# Patient Record
Sex: Female | Born: 1955
Health system: Southern US, Community
[De-identification: ages and names within clinical notes are randomized; demographics above are authoritative.]

## PROBLEM LIST (undated history)

## (undated) DIAGNOSIS — R053 Chronic cough: Secondary | ICD-10-CM

## (undated) DIAGNOSIS — T4145XA Adverse effect of unspecified anesthetic, initial encounter: Secondary | ICD-10-CM

## (undated) DIAGNOSIS — T8859XA Other complications of anesthesia, initial encounter: Secondary | ICD-10-CM

## (undated) DIAGNOSIS — I10 Essential (primary) hypertension: Secondary | ICD-10-CM

## (undated) DIAGNOSIS — K219 Gastro-esophageal reflux disease without esophagitis: Secondary | ICD-10-CM

## (undated) DIAGNOSIS — I480 Paroxysmal atrial fibrillation: Secondary | ICD-10-CM

## (undated) DIAGNOSIS — K469 Unspecified abdominal hernia without obstruction or gangrene: Secondary | ICD-10-CM

## (undated) DIAGNOSIS — I447 Left bundle-branch block, unspecified: Secondary | ICD-10-CM

## (undated) DIAGNOSIS — R32 Unspecified urinary incontinence: Secondary | ICD-10-CM

## (undated) DIAGNOSIS — N83202 Unspecified ovarian cyst, left side: Secondary | ICD-10-CM

## (undated) DIAGNOSIS — R05 Cough: Secondary | ICD-10-CM

## (undated) DIAGNOSIS — N3289 Other specified disorders of bladder: Secondary | ICD-10-CM

## (undated) DIAGNOSIS — M5126 Other intervertebral disc displacement, lumbar region: Secondary | ICD-10-CM

## (undated) DIAGNOSIS — L509 Urticaria, unspecified: Secondary | ICD-10-CM

## (undated) DIAGNOSIS — N3281 Overactive bladder: Secondary | ICD-10-CM

## (undated) DIAGNOSIS — Z9989 Dependence on other enabling machines and devices: Secondary | ICD-10-CM

## (undated) DIAGNOSIS — I4891 Unspecified atrial fibrillation: Secondary | ICD-10-CM

## (undated) DIAGNOSIS — D509 Iron deficiency anemia, unspecified: Secondary | ICD-10-CM

## (undated) DIAGNOSIS — E785 Hyperlipidemia, unspecified: Secondary | ICD-10-CM

## (undated) DIAGNOSIS — N393 Stress incontinence (female) (male): Secondary | ICD-10-CM

## (undated) DIAGNOSIS — F988 Other specified behavioral and emotional disorders with onset usually occurring in childhood and adolescence: Secondary | ICD-10-CM

## (undated) DIAGNOSIS — Z87820 Personal history of traumatic brain injury: Secondary | ICD-10-CM

## (undated) DIAGNOSIS — E119 Type 2 diabetes mellitus without complications: Secondary | ICD-10-CM

## (undated) DIAGNOSIS — E1142 Type 2 diabetes mellitus with diabetic polyneuropathy: Secondary | ICD-10-CM

## (undated) DIAGNOSIS — F418 Other specified anxiety disorders: Secondary | ICD-10-CM

## (undated) DIAGNOSIS — N83201 Unspecified ovarian cyst, right side: Secondary | ICD-10-CM

## (undated) DIAGNOSIS — Z87442 Personal history of urinary calculi: Secondary | ICD-10-CM

## (undated) DIAGNOSIS — N201 Calculus of ureter: Secondary | ICD-10-CM

## (undated) DIAGNOSIS — Z8669 Personal history of other diseases of the nervous system and sense organs: Secondary | ICD-10-CM

## (undated) DIAGNOSIS — G4733 Obstructive sleep apnea (adult) (pediatric): Secondary | ICD-10-CM

## (undated) HISTORY — PX: EXTRACORPOREAL SHOCK WAVE LITHOTRIPSY: SHX1557

## (undated) HISTORY — DX: Type 2 diabetes mellitus without complications: E11.9

## (undated) HISTORY — DX: Other specified anxiety disorders: F41.8

## (undated) HISTORY — DX: Iron deficiency anemia, unspecified: D50.9

## (undated) HISTORY — PX: OTHER SURGICAL HISTORY: SHX169

## (undated) HISTORY — DX: Other specified behavioral and emotional disorders with onset usually occurring in childhood and adolescence: F98.8

## (undated) HISTORY — PX: CARDIOVASCULAR STRESS TEST: SHX262

## (undated) HISTORY — DX: Essential (primary) hypertension: I10

## (undated) HISTORY — DX: Urticaria, unspecified: L50.9

## (undated) HISTORY — DX: Gastro-esophageal reflux disease without esophagitis: K21.9

## (undated) HISTORY — PX: KNEE ARTHROSCOPY W/ DEBRIDEMENT: SHX1867

## (undated) HISTORY — DX: Morbid (severe) obesity due to excess calories: E66.01

---
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
## 1984-03-29 HISTORY — PX: RECTOVAGINAL FISTULA CLOSURE: SUR265

## 1988-03-29 HISTORY — PX: RECTOVAGINAL FISTULA CLOSURE: SUR265

## 1998-02-26 ENCOUNTER — Encounter: Payer: Self-pay | Admitting: *Deleted

## 1998-02-26 ENCOUNTER — Ambulatory Visit (HOSPITAL_COMMUNITY): Admission: RE | Admit: 1998-02-26 | Discharge: 1998-02-26 | Payer: Self-pay | Admitting: *Deleted

## 1998-05-27 ENCOUNTER — Encounter: Admission: RE | Admit: 1998-05-27 | Discharge: 1998-06-25 | Payer: Self-pay | Admitting: Family Medicine

## 1998-06-16 ENCOUNTER — Ambulatory Visit (HOSPITAL_COMMUNITY): Admission: RE | Admit: 1998-06-16 | Discharge: 1998-06-16 | Payer: Self-pay | Admitting: Family Medicine

## 1998-06-16 ENCOUNTER — Encounter: Payer: Self-pay | Admitting: Family Medicine

## 1998-06-25 ENCOUNTER — Encounter: Payer: Self-pay | Admitting: *Deleted

## 1998-06-25 ENCOUNTER — Ambulatory Visit (HOSPITAL_COMMUNITY): Admission: RE | Admit: 1998-06-25 | Discharge: 1998-06-25 | Payer: Self-pay | Admitting: *Deleted

## 1998-12-30 ENCOUNTER — Encounter: Admission: RE | Admit: 1998-12-30 | Discharge: 1999-01-30 | Payer: Self-pay | Admitting: Specialist

## 1999-07-14 ENCOUNTER — Encounter: Payer: Self-pay | Admitting: Allergy and Immunology

## 1999-07-14 ENCOUNTER — Encounter: Admission: RE | Admit: 1999-07-14 | Discharge: 1999-07-14 | Payer: Self-pay | Admitting: Allergy and Immunology

## 1999-10-15 ENCOUNTER — Ambulatory Visit (HOSPITAL_BASED_OUTPATIENT_CLINIC_OR_DEPARTMENT_OTHER): Admission: RE | Admit: 1999-10-15 | Discharge: 1999-10-15 | Payer: Self-pay | Admitting: Otolaryngology

## 1999-12-15 ENCOUNTER — Ambulatory Visit: Admission: RE | Admit: 1999-12-15 | Discharge: 1999-12-15 | Payer: Self-pay | Admitting: Pulmonary Disease

## 1999-12-15 ENCOUNTER — Encounter: Payer: Self-pay | Admitting: Pulmonary Disease

## 2000-01-07 ENCOUNTER — Ambulatory Visit (HOSPITAL_COMMUNITY): Admission: RE | Admit: 2000-01-07 | Discharge: 2000-01-07 | Payer: Self-pay | Admitting: Pulmonary Disease

## 2000-12-19 ENCOUNTER — Encounter: Payer: Self-pay | Admitting: Orthopedic Surgery

## 2000-12-22 ENCOUNTER — Encounter: Payer: Self-pay | Admitting: Orthopedic Surgery

## 2000-12-22 ENCOUNTER — Inpatient Hospital Stay (HOSPITAL_COMMUNITY): Admission: RE | Admit: 2000-12-22 | Discharge: 2000-12-25 | Payer: Self-pay | Admitting: Orthopedic Surgery

## 2001-06-09 ENCOUNTER — Other Ambulatory Visit: Admission: RE | Admit: 2001-06-09 | Discharge: 2001-06-09 | Payer: Self-pay | Admitting: Obstetrics and Gynecology

## 2004-11-29 ENCOUNTER — Emergency Department (HOSPITAL_COMMUNITY): Admission: EM | Admit: 2004-11-29 | Discharge: 2004-11-29 | Payer: Self-pay | Admitting: *Deleted

## 2005-01-11 ENCOUNTER — Inpatient Hospital Stay (HOSPITAL_COMMUNITY): Admission: EM | Admit: 2005-01-11 | Discharge: 2005-01-15 | Payer: Self-pay | Admitting: Emergency Medicine

## 2005-01-14 ENCOUNTER — Encounter (INDEPENDENT_AMBULATORY_CARE_PROVIDER_SITE_OTHER): Payer: Self-pay | Admitting: *Deleted

## 2005-01-14 HISTORY — PX: LAPAROSCOPIC CHOLECYSTECTOMY: SUR755

## 2005-02-23 ENCOUNTER — Emergency Department (HOSPITAL_COMMUNITY): Admission: EM | Admit: 2005-02-23 | Discharge: 2005-02-23 | Payer: Self-pay | Admitting: Emergency Medicine

## 2005-03-15 ENCOUNTER — Ambulatory Visit (HOSPITAL_COMMUNITY): Admission: RE | Admit: 2005-03-15 | Discharge: 2005-03-15 | Payer: Self-pay | Admitting: Gastroenterology

## 2007-03-21 ENCOUNTER — Encounter: Admission: RE | Admit: 2007-03-21 | Discharge: 2007-03-22 | Payer: Self-pay | Admitting: Otolaryngology

## 2007-04-04 ENCOUNTER — Encounter: Admission: RE | Admit: 2007-04-04 | Discharge: 2007-04-26 | Payer: Self-pay | Admitting: Otolaryngology

## 2010-08-14 NOTE — Consult Note (Signed)
[name], [name]               ACCOUNT NO.:  [id number]   MEDICAL RECORD NO.:  [id number]          PATIENT TYPE:  INP   LOCATION:  0103                         FACILITY:  [facility]   PHYSICIAN:  Kristin L. Lovena Le, MD  DATE OF BIRTH:  1955-07-28   DATE OF CONSULTATION:  01/11/2005  DATE OF DISCHARGE:                                   CONSULTATION   REFERRING PHYSICIAN:  Haywood [name], M.D.   REASON FOR CONSULTATION:  To assist with hypertension, diabetes and sleep  apnea.   HISTORY OF PRESENT ILLNESS:  The patient is a 55 year old, white female with  a history of hypertension, diabetes, sleep apnea and known gallbladder  disease who is being admitted for exacerbation of said gallbladder disease  resulting in biliary pancreatitis.  We have been asked to assist with her  hypertension and diabetes as well as her sleep apnea.   REVIEW OF SYSTEMS:  GASTROINTESTINAL:  Right-sided and central abdominal  pain, nausea, vomiting.  CONSTITUTIONAL:  No fevers or chills.  No recent  weight loss or gain.  MUSCULOSKELETAL:  Chronic pain in the right lower  extremity, but otherwise review of systems is negative.   PAST MEDICAL HISTORY:  1.  Sleep apnea on CPAP setting presumed to be 12 as per the patient.  2.  DVT in her left lower extremity.  3.  Morbidly obese.  4.  Right ankle fracture.  5.  Osteoarthritis in the right ankle.  6.  Diabetes diagnosed 1 month ago.  7.  Chronic cough resulting in urinary incontinence.   PAST SURGICAL HISTORY:  1.  Rectovaginal fistula requiring colostomy.  Her colostomy was taken down      in 1991.  2.  Multiple abdominal surgeries.  3.  Ankle repair with bone grafting.  4.  Tonsils were removed.   ALLERGIES:  SULFA, [place], [place].   CURRENT MEDICATIONS:  1.  Lexapro 20 mg daily.  2.  Ritalin 10 mg b.i.d. p.r.n.  3.  Hyzaar 50/12.5 once daily.  4.  Claritin p.r.n.  5.  Vicodin p.r.n.  6.  Phenergan 25 mg p.r.n. for nausea.   SOCIAL  HISTORY:  She denies tobacco.  She does have occasional ethanol.  She  is retired from [name].   FAMILY HISTORY:  Mom is living with diabetes and hypertension.  Dad is  deceased secondary to a brain tumor.   PHYSICAL EXAMINATION:  VITAL SIGNS:  Temperature 98.2, blood pressure 97/60,  pulse 73, respirations 18, saturations 93%.  [name]:  This is an obese, white female in no acute distress.  She is  mildly somnolent secondary to pain medications.  HEENT:  She is normocephalic, atraumatic, pupils equal round and reactive to  light.  Extraocular movements intact.  Mucous membranes are moist.  NECK:  Supple and short.  There is no JVD, no lymph nodes, no carotid  bruits.  CHEST:  Clear to auscultation with decreased breath sounds bilaterally at  the bases.  CARDIAC:  Regular rate and rhythm with positive S1, S2, no S3, S4, no  murmurs, rubs or gallops.  ABDOMEN:  Soft, obese, nondistended with positive bowel sounds.  She is  tender to deep palpation in the epigastrium greater than the right upper  quadrant.  EXTREMITIES:  No clubbing, cyanosis or edema.  NEUROLOGIC:  She is sleepy, secondary to pain medications.  Power appears to  be 5/5.  Sensation intact.  Plantars are downgoing.   LABORATORY DATA AND X-RAY FINDINGS:  White count 11.5, hemoglobin 14.7,  hematocrit 43.3, platelets 342.  Sodium 138, potassium 3.2, chloride 99, CO2  24, BUN 16, creatinine 0.9, glucose 183.  Amylase 1695, lipase 1624, AST 268  with an ALT of 165 and Alk phos of 152.   ASSESSMENT:  This is a 55 year old, obese female with biliary pancreatitis  and known diabetes which was recently diagnosed, hypertension and sleep  apnea.  We will assist with her diabetes, sleep apnea and hypertension care.   RECOMMENDATIONS:  1.  Pulmonary.  Sleep apnea on CPAP which we will continue as per pulmonary      protocol.  Chest x-ray shows no acute disease.  Therefore, we will      recommend aggressive incentive  spirometry and continuation of her CPAP.  2.  Cardiovascular.  History of hypertension.  Blood pressure is currently      stable on Hyzaar.  Will continue the Losartan and hydrochlorothiazide      components with hold parameters for systolic blood pressures less than      100.  Will obtain a preoperative EKG.  3.  Gastrointestinal.  Surgical intervention will be as per surgery.  She is      NPO except for sips right now.  4.  Endocrine.  Will place her on sliding scale insulin since she will be      NPO and check a hemoglobin A1c.  5.  Deep venous thrombosis prophylaxis with Lovenox.  The patient does have      a history of lower extremity DVT.  She will require coverage while      convalescing.  6.  Hypokalemia.  Will replete her potassium p.o. and IV.      Kristin L. Lovena Le, MD  Electronically Signed     MLT/MEDQ  D:  01/11/2005  T:  01/11/2005  Job:  [id number]   cc:   Kristin [name]. Kristin [name], M.D.  Fax: [phone]

## 2010-08-14 NOTE — H&P (Signed)
NAMEMarland Kitchen  Kristin [name], Kristin [name] NO.:  [id number]   MEDICAL RECORD NO.:  [id number]          PATIENT TYPE:  IN   LOCATION:                               FACILITY:  [facility]   PHYSICIAN:  Haywood Lasso, M.D.DATE OF BIRTH:  [birth year]   DATE OF ADMISSION:  01/11/2005  DATE OF DISCHARGE:                                HISTORY & PHYSICAL   OFFICE MEDICAL RECORD NUMBER:  [id number].   CHIEF COMPLAINT:  Abdominal pain.   HISTORY OF PRESENT ILLNESS:  Kristin [name] is a 55 year old lady who is known  to have gallstones and has had one episode apparently of biliary  pancreatitis with known gallstones. She was admitted via the emergency room  today with another pain, this time more nausea and some vomiting which she  has not had previously. This pain has not gone away at all. She has been  having these now about 2 months. This is a very typical one and as noted on  my office notes very similar to those.   MEDICATIONS:  Hyzaar, Lexapro, Prilosec, Claritin, lorazepam, and Ritalin.   ALLERGIES:  KEFLEX and SULFA.   PRIOR OPERATIONS:  1.  She has had multiple prior abdominal surgeries.  2.  She has had a rectovaginal fistula repair with a colostomy that had to      be finally taken down in 1991.  3.  She has had a known history of DVT of her left lower extremity. She has      not been on any anticoagulation.   SOCIAL HISTORY:  Does not smoke, drinks occasionally. She is retired.   FAMILY HISTORY:  Positive for cancer, hypertension, diabetes and glaucoma.   REVIEW OF SYSTEMS:  As noted and not redictated here. It is positive for  hypertension, chronic cough, CPAP use, problems with incontinence, problems  with arthritis, high cholesterol, depression, diabetes.   PHYSICAL EXAMINATION:  GENERAL:  In the emergency room the patient was  alert, awake, comfortable having been given pain medication.  VITAL SIGNS:  Include a blood pressure 112/59, pulse 76, temperature 98,  respirations  20.  HEENT:  The head is normocephalic, with eyes nonicteric. Pupils equal,  round. EOMs normal.  NECK:  Supple, no masses or thyromegaly.  LUNGS:  Normal respirations, clear to auscultation.  HEART:  Regular rhythm; no murmurs, rubs or gallops. Peripheral pulses  intact at the carotid, dorsalis pedis and posterior tibial.  ABDOMEN:  She has mild epigastric tenderness. She has no masses or  organomegaly and no hernias are noted. She does have a long midline scar as  well as left lower quadrant and left mid-abdominal transverse scar.  EXTREMITIES:  Good range of motion, left leg noted slightly larger than the  right.   DATA REVIEWED:  She does have again an elevated amylase and lipase, slightly  elevated bilirubin. White count is 11,000.   IMPRESSION:  1.  Biliary pancreatitis.  2.  Hypertension.  3.  High cholesterol.  4.  History of diabetes.  5.  History of deep vein thrombosis.   PLAN:  Will admit her; start  her on some clear liquids, pain medication, DVT  prophylaxis, and follow up her liver functions. When her amylase and lipase  have trended towards normal, I think we can go ahead and schedule a  laparoscopic cholecystectomy with cholangiogram.      Haywood Lasso, M.D.  Electronically Signed     CJS/MEDQ  D:  01/11/2005  T:  01/11/2005  Job:  [id number]   cc:   Emeline General. Dema Severin, M.D.  Fax: [phone]

## 2010-08-14 NOTE — Op Note (Signed)
[name], [name]               ACCOUNT NO.:  [id number]   MEDICAL RECORD NO.:  [id number]          PATIENT TYPE:  INP   LOCATION:  1416                         FACILITY:  [facility]   PHYSICIAN:  Haywood Lasso, M.D.DATE OF BIRTH:  [birth year]   DATE OF PROCEDURE:  01/14/2005  DATE OF DISCHARGE:                                 OPERATIVE REPORT   PREOPERATIVE DIAGNOSIS:  Gallstones with recent biliary pancreatitis.   POSTOPERATIVE DIAGNOSIS:  Gallstones with recent biliary pancreatitis.   OFFICE MEDICAL RECORD NUMBER:  [id number]   OPERATION:  Laparoscopic cholecystectomy with operative cholangiogram.   SURGEON:  Haywood Lasso, M.D.   ASSISTANT:  Odis Hollingshead, M.D.   ANESTHESIA:  General endotracheal.   CLINICAL HISTORY:  This is a 67-year lady recently admitted with another  episode of biliary pancreatitis.  We allowed that to resolve and then  proceeded to take her to the operating room for laparoscopic  cholecystectomy, possible open cholecystectomy.   DESCRIPTION OF PROCEDURE:  The patient was seen in the holding area and she  had no further questions. We confirmed that cholecystectomy was the planned  procedure.   She was taken to the operating room and after satisfactory general  endotracheal anesthesia had been obtained, the abdomen was prepped and  draped. The time-out occurred.   I initially made an incision in her old long midline scar just above the  umbilicus, divided the subcutaneous tissues down to what I was trying to  identify as fascia, but the patient is morbidly obese and this was over 2  inches of depth here. I was not sure I was going to be able to get good  visualization to do a safe entry here so I temporarily abandoned that  approach and made an incision laterally in the right upper abdomen. I used  the OptiVu and was able to gain safe entrance into the peritoneal cavity.  The abdomen was insufflated and the camera placed. With the  camera in place,  I could see that there were omental adhesions along the midline where I had  been initially trying to get in. However, the epigastric area was free and I  put a 10/11 trocar in there under direct vision for the upper epigastric  trocar. With the camera in that spot, I was then able to put another 5  trocar in the right mid abdomen for the second lateral trocar. With the  combination of using the camera and some dissectors in the trocar, I was  able to free up some of the omentum off of the midline so that I could again  gain entrance, this time at the original attempted , incision above the  umbilicus. Again under direct vision, a trocar was placed.   The patient was then placed in reverse Trendelenburg. Multiple omental  adhesions were taken off of the gallbladder until I could identify the area  of the cystic duct. I dissected this out using a combination of blunt  dissection and hydrodissection. I could identify the cystic duct and what  appeared to be the anterior branch of the  cystic artery. I placed one clip  on each.   The cystic duct was then opened and a Cook catheter introduced  percutaneously and slipped into the cystic duct and held with a clip.  Operative cholangiography showed a somewhat plump common duct but there was  good filling into the duodenum and filling of hepatic radicals.   The catheter was removed and three clips placed in the safe side of the  cystic duct and it was divided. Two additional clips were placed on the  artery and it was divided. This appeared to be the only arterial supply.   The gallbladder was removed from below to above. We did spill some bile as  the gallbladder was very thin-walled and because of her obesity, we were  having to place a lot of torque pulling but did not seem to spill any  stones. Once the gallbladder was disconnected, I spent several minutes  making sure we got the bed of the gallbladder dry as the  gallbladder was  partially intrahepatic. Once I thought it was dry, we left a piece of  Surgicel in place.   The gallbladder was placed in a bag as soon as it had been disconnected and  we then brought it out through the lateral port. We reinsufflated, checked  to make sure there had been no bleeding where we placed the Surgicel,  irrigated out any liquid that was present and suctioned this out. We checked  to make sure there was no bleeding from where we had taken the omentum down  and everything appeared to be dry. The two lateral ports were removed under  direct vision. Epigastric port was removed after we deflated the abdomen.  Skin was closed with staples.   The patient tolerated the procedure well. There were no operative  complications. All counts were correct.      Haywood Lasso, M.D.  Electronically Signed     CJS/MEDQ  D:  01/14/2005  T:  01/14/2005  Job:  [id number]   cc:   Emeline General. Dema Severin, M.D.  Fax: [phone]

## 2010-08-14 NOTE — Discharge Summary (Signed)
Kristin [name], Kristin [name]               ACCOUNT NO.:  [id number]   MEDICAL RECORD NO.:  [id number]          PATIENT TYPE:  INP   LOCATION:  1416                         [facility]ACILITY:  [facility]   PHYSICIAN:  Haywood Lasso, M.D.DATE OF BIRTH:  04/26/1955   DATE OF ADMISSION:  01/11/2005  DATE OF DISCHARGE:  01/15/2005                                 DISCHARGE SUMMARY   FINAL DIAGNOSES:  1.  Pancreatitis, possibly biliary.  2.  Chronic cholecystitis with cholesterolosis.  3.  Hypertension.  4.  Type 2 diabetes.  5.  History of reflux.  6.  History of venous thrombosis with pulmonary embolus.  7.  Obesity.  8.  Hypokalemia.   MEDICAL HISTORY:  Kristin [name] is a 55 year old lady who was admitted via the  emergency room on January 11, 2005 with a history of gallstones and  pancreatitis. She presented back emergency room with an additional episode  of epigastric pain. She had more nausea this time with her other episodes  and the pain had not gone away. This current episode was very typical for  her other episodes.   PAST MEDICAL HISTORY:  Is significant in that she has had multiple prior  abdominal surgeries including repair of rectovaginal fistula that  necessitated protective colostomy and a known history of DVT.   SOCIAL HISTORY:  Was unremarkable.   Exam was remarkable for mild epigastric tenderness. The laboratory studies  on admission showed an elevated amylase and lipase, and a slightly elevated  bilirubin with a white count of 11,000.   HOSPITAL COURSE:  The patient was admitted and begun on IV fluids. Her pain  gradually improved over the next couple of days and her liver functions and  pancreatitis appeared likewise to resolve. She was felt stable enough to go  to the operating room with a resolve of her pancreatitis was taken to the  operating room on January 14, 2005. We were able to do a laparoscopic  cholecystectomy.  She did well postoperatively and in fact she was be  discharged the day following surgery.   Pathology report did not show gallstones, but just cholesterolosis. Whether  some small stones were spilled and/or passed to cause her pancreatitis is  uncertain. The patient's was advised to resume all of her usual home  medications per the medication reconciliation sheet.   Other laboratory data this admission included initial white count of 11,000  which returned down to 8100.  Initial hemoglobin was 14 but drifted down to  11.3 and this thought secondary to rehydration. Electrolytes showed a  hypokalemia at 3.2 but this corrected with IV potassium. Initial lipase was  1624 and came down to 891, 146 and finally 48. Hemoglobin A1C was 7.7 on  admission.   A urine culture did show multiple species, probable contaminant. EKG was  normal.      Haywood Lasso, M.D.  Electronically Signed     CJS/MEDQ  D:  01/27/2005  T:  01/28/2005  Job:  [id number]   cc:   Emeline General. Dema Severin, M.D.  Fax: [phone]

## 2011-03-25 ENCOUNTER — Ambulatory Visit
Admission: RE | Admit: 2011-03-25 | Discharge: 2011-03-25 | Disposition: A | Discharge status: Home / Self Care | Payer: Medicare Other | Source: Ambulatory Visit | Attending: Family Medicine | Admitting: Family Medicine

## 2011-03-25 ENCOUNTER — Other Ambulatory Visit: Payer: Self-pay | Admitting: Family Medicine

## 2011-03-25 DIAGNOSIS — R059 Cough, unspecified: Secondary | ICD-10-CM

## 2011-03-25 DIAGNOSIS — R05 Cough: Secondary | ICD-10-CM

## 2011-07-06 DIAGNOSIS — F329 Major depressive disorder, single episode, unspecified: Secondary | ICD-10-CM | POA: Diagnosis not present

## 2011-07-06 DIAGNOSIS — G4730 Sleep apnea, unspecified: Secondary | ICD-10-CM | POA: Diagnosis not present

## 2011-07-06 DIAGNOSIS — I10 Essential (primary) hypertension: Secondary | ICD-10-CM | POA: Diagnosis not present

## 2011-07-06 DIAGNOSIS — M79609 Pain in unspecified limb: Secondary | ICD-10-CM | POA: Diagnosis not present

## 2011-07-06 DIAGNOSIS — E119 Type 2 diabetes mellitus without complications: Secondary | ICD-10-CM | POA: Diagnosis not present

## 2011-07-06 DIAGNOSIS — E785 Hyperlipidemia, unspecified: Secondary | ICD-10-CM | POA: Diagnosis not present

## 2011-08-24 DIAGNOSIS — G4733 Obstructive sleep apnea (adult) (pediatric): Secondary | ICD-10-CM | POA: Diagnosis not present

## 2011-08-24 DIAGNOSIS — E1101 Type 2 diabetes mellitus with hyperosmolarity with coma: Secondary | ICD-10-CM | POA: Diagnosis not present

## 2011-09-17 DIAGNOSIS — G4733 Obstructive sleep apnea (adult) (pediatric): Secondary | ICD-10-CM | POA: Diagnosis not present

## 2011-11-25 DIAGNOSIS — N6459 Other signs and symptoms in breast: Secondary | ICD-10-CM | POA: Diagnosis not present

## 2011-12-08 DIAGNOSIS — E119 Type 2 diabetes mellitus without complications: Secondary | ICD-10-CM | POA: Diagnosis not present

## 2011-12-08 DIAGNOSIS — H25049 Posterior subcapsular polar age-related cataract, unspecified eye: Secondary | ICD-10-CM | POA: Diagnosis not present

## 2011-12-08 DIAGNOSIS — H2510 Age-related nuclear cataract, unspecified eye: Secondary | ICD-10-CM | POA: Diagnosis not present

## 2011-12-09 DIAGNOSIS — R059 Cough, unspecified: Secondary | ICD-10-CM | POA: Diagnosis not present

## 2011-12-09 DIAGNOSIS — R05 Cough: Secondary | ICD-10-CM | POA: Diagnosis not present

## 2011-12-09 DIAGNOSIS — N643 Galactorrhea not associated with childbirth: Secondary | ICD-10-CM | POA: Diagnosis not present

## 2011-12-09 DIAGNOSIS — E119 Type 2 diabetes mellitus without complications: Secondary | ICD-10-CM | POA: Diagnosis not present

## 2011-12-17 ENCOUNTER — Institutional Professional Consult (permissible substitution): Payer: Medicare Other | Admitting: Internal Medicine

## 2011-12-17 ENCOUNTER — Encounter: Payer: Self-pay | Admitting: *Deleted

## 2011-12-31 ENCOUNTER — Encounter: Payer: Self-pay | Admitting: Internal Medicine

## 2012-01-03 ENCOUNTER — Institutional Professional Consult (permissible substitution): Payer: Medicare Other | Admitting: Internal Medicine

## 2012-01-13 DIAGNOSIS — I10 Essential (primary) hypertension: Secondary | ICD-10-CM | POA: Diagnosis not present

## 2012-01-13 DIAGNOSIS — E785 Hyperlipidemia, unspecified: Secondary | ICD-10-CM | POA: Diagnosis not present

## 2012-01-13 DIAGNOSIS — G2581 Restless legs syndrome: Secondary | ICD-10-CM | POA: Diagnosis not present

## 2012-01-13 DIAGNOSIS — N951 Menopausal and female climacteric states: Secondary | ICD-10-CM | POA: Diagnosis not present

## 2012-01-13 DIAGNOSIS — E119 Type 2 diabetes mellitus without complications: Secondary | ICD-10-CM | POA: Diagnosis not present

## 2012-01-13 DIAGNOSIS — Z23 Encounter for immunization: Secondary | ICD-10-CM | POA: Diagnosis not present

## 2012-01-13 DIAGNOSIS — F329 Major depressive disorder, single episode, unspecified: Secondary | ICD-10-CM | POA: Diagnosis not present

## 2012-01-14 ENCOUNTER — Encounter: Payer: Self-pay | Admitting: Internal Medicine

## 2012-01-17 ENCOUNTER — Encounter: Payer: Self-pay | Admitting: Internal Medicine

## 2012-01-17 ENCOUNTER — Ambulatory Visit (INDEPENDENT_AMBULATORY_CARE_PROVIDER_SITE_OTHER): Payer: 59 | Admitting: Internal Medicine

## 2012-01-17 VITALS — BP 130/86 | HR 86 | Temp 100.1°F | Ht 63.5 in | Wt 261.0 lb

## 2012-01-17 DIAGNOSIS — R05 Cough: Secondary | ICD-10-CM

## 2012-01-17 DIAGNOSIS — R059 Cough, unspecified: Secondary | ICD-10-CM | POA: Diagnosis not present

## 2012-01-17 MED ORDER — VALSARTAN-HYDROCHLOROTHIAZIDE 160-25 MG PO TABS: 1.0000 | ORAL_TABLET | Freq: Every day | ORAL | Status: DC

## 2012-01-17 MED ORDER — PREDNISONE (PAK) 10 MG PO TABS: ORAL_TABLET | ORAL | Status: DC

## 2012-01-17 MED ORDER — TRAMADOL HCL 50 MG PO TABS: ORAL_TABLET | ORAL | Status: DC

## 2012-01-17 NOTE — Progress Notes (Signed)
Subjective:    Patient ID: Kristin [name], female    DOB: April 23, 1955  MRN: [id number]  HPI  47 yowf never smoker with no h/o asthma/ cough as child but since 2000 cough daily previously evaluated in pulmonary clinic with neg mct and referred back  01/17/2012 by Dr Harlan Stains for same cough.  01/17/2012 1st pulmonary eval in [facility] era daily cough x 13 years esp since September of 2013 typically worse in am's p start talking> prod of sev tbsps of clear mucus, no pattern as to what time of year, has to sleep at night with cough drop or two in her mouth> then able to sleep ok s premature awkening. Naroctic Cough meds  make it better - aware that she has hb but taking meds doesn't help the cough but takes prilosec one at bedtime and sometimes in am - has tried advair, singulair, allegra Chlortrimeton has worked the best. No real sob if not coughing.  Denies any obvious fluctuation of symptoms with weather or environmental changes or other aggravating or alleviating factors except as outlined above.     Kouffman Reflux v Neurogenic Cough Differentiator Reflux Comments  Do you awaken from a sound sleep coughing violently?                            With trouble breathing? no   Do you have choking episodes when you cannot  Get enough air, gasping for air ?              Yes   Do you usually cough when you lie down into  The bed, or when you just lie down to rest ?                          Yes   Do you usually cough after meals or eating?         Yes   Do you cough when (or after) you bend over?    Not sure   GERD SCORE     Kouffman Reflux v Neurogenic Cough Differentiator Neurogenic   Do you more-or-less cough all day long? no   Does change of temperature make you cough? yes   Does laughing or chuckling cause you to cough? yes   Do fumes (perfume, automobile fumes, burned  Toast, etc.,) cause you to cough ?      yes   Does speaking, singing, or talking on the phone cause you to cough   ?                yes   Neurogenic/Airway score       Review of Systems  Constitutional: Negative for fever, chills and unexpected weight change.  HENT: Positive for congestion and sneezing. Negative for ear pain, nosebleeds, sore throat, rhinorrhea, trouble swallowing, dental problem, voice change, postnasal drip and sinus pressure.   Eyes: Negative for visual disturbance.  Respiratory: Positive for cough and shortness of breath. Negative for choking.   Cardiovascular: Positive for chest pain and leg swelling.  Gastrointestinal: Negative for vomiting, abdominal pain and diarrhea.  Genitourinary: Negative for difficulty urinating.  Musculoskeletal: Positive for arthralgias.  Skin: Negative for rash.  Neurological: Positive for headaches. Negative for tremors and syncope.  Hematological: Does not bruise/bleed easily.       Objective:   Physical Exam obese wf nad  HEENT: nl dentition, turbinates, and orophanx. Nl external ear  canals without cough reflex   NECK :  without JVD/Nodes/TM/ nl carotid upstrokes bilaterally   LUNGS: no acc muscle use, clear to A and P bilaterally without reproducible cough on insp or exp maneuvers   CV:  RRR  no s3 or murmur or increase in P2, no edema   ABD:  soft and nontender with nl excursion in the supine position. No bruits or organomegaly, bowel sounds nl  MS:  warm without deformities, calf tenderness, cyanosis or clubbing  SKIN: warm and dry without lesions    NEURO:  alert, approp, no deficits    cxr 03/25/11 No active cardiopulmonary process.        Assessment & Plan:

## 2012-01-17 NOTE — Patient Instructions (Addendum)
Stop hyzar and start valsartan 160/25 one daily in its place  Prilosec 20 mg Take 30- 60 min before your first and last meals of the day and Pepcid 20 mg at bedtime  Continue chortrimeton 4 mg one every 4 hours as needed for tickle (as for longer acting)  Take delsym two tsp every 12 hours and supplement if needed with  tramadol 50 mg up to 1-2 every 4 hours to suppress the urge to cough. Swallowing water or using ice chips/non mint and menthol containing candies (such as lifesavers or sugarless jolly ranchers) are also effective.  You should rest your voice and avoid activities that you know make you cough.  Once you have eliminated the cough for 3 straight days try reducing the tramadol first,  then the delsym as tolerated.   GERD (REFLUX)  is an extremely common cause of respiratory symptoms, many times with no significant heartburn at all.    It can be treated with medication, but also with lifestyle changes including avoidance of late meals, excessive alcohol, smoking cessation, and avoid fatty foods, chocolate, peppermint, colas, red wine, and acidic juices such as orange juice.  NO MINT OR MENTHOL PRODUCTS SO NO COUGH DROPS  USE SUGARLESS CANDY INSTEAD (jolley ranchers or Stover's)  NO OIL BASED VITAMINS - use powdered substitutes.  Please schedule a follow up office visit in 4 weeks, sooner if needed

## 2012-01-17 NOTE — Assessment & Plan Note (Signed)
The most common causes of chronic cough in immunocompetent adults include the following: upper airway cough syndrome (UACS), previously referred to as postnasal drip syndrome (PNDS), which is caused by variety of rhinosinus conditions; (2) asthma; (3) GERD; (4) chronic bronchitis from cigarette smoking or other inhaled environmental irritants; (5) nonasthmatic eosinophilic bronchitis; and (6) bronchiectasis.   These conditions, singly or in combination, have accounted for up to 94% of the causes of chronic cough in prospective studies.   Other conditions have constituted no >6% of the causes in prospective studies These have included bronchogenic carcinoma, chronic interstitial pneumonia, sarcoidosis, left ventricular failure, ACEI-induced cough, and aspiration from a condition associated with pharyngeal dysfunction.   .Chronic cough is often simultaneously caused by more than one condition. A single cause has been found from 38 to 82% of the time, multiple causes from 18 to 62%. Multiply caused cough has been the result of three diseases up to 42% of the time.     The standardized cough guidelines recently published in Chest by Lissa Morales in 2006  are a multiple step process (up to 12!) , not a single office visit,  and are intended  to address this problem logically,  with an alogrithm dependent on response to empiric treatment at  each progressive step  to determine a specific diagnosis with  minimal addtional testing needed. Therefore if compliance is an issue or can't be accurately verified then it's very unlikely the standard evaluation and treatment will be successful here.    Furthermore, response to therapy (other than acute cough suppression, which should only be used short term with avoidance of narcotic containing cough syrups if possible), can be a gradual process for which the patient may not receive immediate benefit.  Unlike going to an eye doctor where the right rx is almost always  the first one and is immediately effective, this is almost never the case in the management of chronic cough syndromes and the patient needs to commit up front to compliance with recommendations and have the patience to wait out a response for up to 6 weeks of therapy directed at the likely underlying problem(s).    For now max rx for gerd and non-specific pnds and regroup in 4 weeks  Old records requested

## 2012-01-19 ENCOUNTER — Telehealth: Payer: Self-pay | Admitting: Internal Medicine

## 2012-01-19 NOTE — Telephone Encounter (Signed)
Stop tramadol, list as allergy Stop benadryl Try chlortrimeton 4 mg every 4 hours as needed for itch or cough Prednisone 10 mg take  4 each am x 2 days,   2 each am x 2 days,  1 each am x2days and stop

## 2012-01-19 NOTE — Telephone Encounter (Signed)
I spoke with pt and is aware of MW recs. I have listed tramadol as an allergy. She ahs the same pred taper she started today so does not need this called in since this was called in yesterday. Nothing further was needed

## 2012-01-19 NOTE — Telephone Encounter (Signed)
I spoke with pt and she stated after she took tramadol, pepcid, and the delsym cough syrup last night she started itching all over her body. She took benadryl and it helped but did not take away the itching. She is still itching all over this morning but she has not taken any of her meds yet. Please advise Dr. Melvyn Novas thanks

## 2012-01-24 DIAGNOSIS — Z13820 Encounter for screening for osteoporosis: Secondary | ICD-10-CM | POA: Diagnosis not present

## 2012-02-16 ENCOUNTER — Encounter: Payer: Self-pay | Admitting: Internal Medicine

## 2012-02-16 ENCOUNTER — Ambulatory Visit (INDEPENDENT_AMBULATORY_CARE_PROVIDER_SITE_OTHER): Payer: 59 | Admitting: Internal Medicine

## 2012-02-16 VITALS — BP 128/78 | HR 97 | Temp 98.3°F | Ht 63.5 in | Wt 267.0 lb

## 2012-02-16 DIAGNOSIS — R05 Cough: Secondary | ICD-10-CM | POA: Diagnosis not present

## 2012-02-16 DIAGNOSIS — R059 Cough, unspecified: Secondary | ICD-10-CM

## 2012-02-16 NOTE — Progress Notes (Signed)
Subjective:    Patient ID: Kristin [name], female    DOB: 1955-05-07  MRN: [id number]  HPI  88 yowf never smoker with no h/o asthma/ cough as child but since 2000 cough daily previously evaluated in pulmonary clinic with neg mct and referred back  01/17/2012 by Dr Harlan Stains for same cough.  01/17/2012 1st pulmonary eval in [facility] daily cough x 13 years esp since September of 2013 typically worse in am's p start talking> prod of sev tbsps of clear mucus, no pattern as to what time of year, has to sleep at night with cough drop or two in her mouth> then able to sleep ok s premature awkening. Narcotic Cough meds  make it better - aware that she has hb but taking meds doesn't help the cough but takes prilosec one at bedtime and sometimes in am - has tried advair, singulair, allegra Chlortrimeton has worked the best. No real sob if not coughing. rec Stop hyzar and start valsartan 160/25 one daily in its place Prilosec 20 mg Take 30- 60 min before your first and last meals of the day and Pepcid 20 mg at bedtime Continue chortrimeton 4 mg one every 4 hours as needed for tickle (as for longer acting) Take delsym two tsp every 12 hours and supplement if needed   02/16/2012 f/u ov/Abdias Hickam cc cough better maybe 50% when remembers to take her meds correctly. No sob.  No obvious daytime variabilty or assoc excess mucus or cp or chest tightness, subjective wheeze overt sinus or hb symptoms. No unusual exp hx    Denies any obvious fluctuation of symptoms with weather or environmental changes or other aggravating or alleviating factors except as outlined above.     Kouffman Reflux v Neurogenic Cough Differentiator Reflux Comments  Do you awaken from a sound sleep coughing violently?                            With trouble breathing? no   Do you have choking episodes when you cannot  Get enough air, gasping for air ?              Less now   Do you usually cough when you lie down into  The bed, or when  you just lie down to rest ?                          Less now   Do you usually cough after meals or eating?         Less now   Do you cough when (or after) you bend over?    some   GERD SCORE     Kouffman Reflux v Neurogenic Cough Differentiator Neurogenic   Do you more-or-less cough all day long? no   Does change of temperature make you cough? Not sure less   Does laughing or chuckling cause you to cough? no   Do fumes (perfume, automobile fumes, burned  Toast, etc.,) cause you to cough ?      no   Does speaking, singing, or talking on the phone cause you to cough   ?               no   Neurogenic/Airway score         ROS  The following are not active complaints unless bolded sore throat, dysphagia, dental problems, itching, sneezing,  nasal congestion  or excess/ purulent secretions, ear ache,   fever, chills, sweats, unintended wt loss, pleuritic or exertional cp, hemoptysis,  orthopnea pnd or leg swelling, presyncope, palpitations, heartburn, abdominal pain, anorexia, nausea, vomiting, diarrhea  or change in bowel or urinary habits, change in stools or urine, dysuria,hematuria,  rash, arthralgias, visual complaints, headache, numbness weakness or ataxia or problems with walking or coordination,  change in mood/affect or memory.          Objective:   Physical Exam  Wt Readings from Last 3 Encounters:  02/16/12 267 lb (121.11 kg)  01/17/12 261 lb (118.389 kg)   obese wf nad  HEENT: nl dentition, turbinates, and orophanx. Nl external ear canals without cough reflex   NECK :  without JVD/Nodes/TM/ nl carotid upstrokes bilaterally   LUNGS: no acc muscle use, clear to A and P bilaterally without reproducible cough on insp or exp maneuvers   CV:  RRR  no s3 or murmur or increase in P2, no edema   ABD:  soft and nontender with nl excursion in the supine position. No bruits or organomegaly, bowel sounds nl  MS:  warm without deformities, calf tenderness, cyanosis or  clubbing  SKIN: warm and dry without lesions    NEURO:  alert, approp, no deficits    cxr 03/25/11 No active cardiopulmonary process.        Assessment & Plan:

## 2012-02-16 NOTE — Patient Instructions (Addendum)
No change in meds  For now except try tapering off delsym once 100% adherent to the reflux regimen  Please schedule a follow up visit in 3 months but call sooner if needed

## 2012-02-18 NOTE — Assessment & Plan Note (Signed)
She's better despite non adherence to gerd rx but still over reliant of cough suppression.  This is typical of  Classic Upper airway cough syndrome, so named because it's frequently impossible to sort out how much is  CR/sinusitis with freq throat clearing (which can be related to primary GERD)   vs  causing  secondary (" extra esophageal")  GERD from wide swings in gastric pressure that occur with throat clearing, often  promoting self use of mint and menthol lozenges that reduce the lower esophageal sphincter tone and exacerbate the problem further in a cyclical fashion.   These are the same pts (now being labeled as having "irritable larynx syndrome" by some cough centers) who not infrequently have a history of having failed to tolerate ace inhibitors,  dry powder inhalers or biphosphonates or report having atypical reflux symptoms that don't respond to standard doses of PPI , and are easily confused as having aecopd or asthma flares by even experienced allergists/ pulmonologists.   For now, since better vs the last 13 years, per guidelines no change in rx for a complete 3 month rx to see how symptoms improve or plateau before considering additional measures.

## 2012-05-16 ENCOUNTER — Ambulatory Visit (INDEPENDENT_AMBULATORY_CARE_PROVIDER_SITE_OTHER): Payer: 59 | Admitting: Internal Medicine

## 2012-05-16 ENCOUNTER — Other Ambulatory Visit (INDEPENDENT_AMBULATORY_CARE_PROVIDER_SITE_OTHER): Payer: Medicare Other

## 2012-05-16 ENCOUNTER — Other Ambulatory Visit: Payer: Self-pay | Admitting: Internal Medicine

## 2012-05-16 ENCOUNTER — Encounter: Payer: Self-pay | Admitting: Internal Medicine

## 2012-05-16 VITALS — BP 124/80 | HR 91 | Temp 97.2°F | Ht 63.0 in | Wt 277.6 lb

## 2012-05-16 DIAGNOSIS — R05 Cough: Secondary | ICD-10-CM | POA: Diagnosis not present

## 2012-05-16 DIAGNOSIS — R059 Cough, unspecified: Secondary | ICD-10-CM

## 2012-05-16 LAB — CBC WITH DIFFERENTIAL/PLATELET
Basophils Absolute: 0.1 10*3/uL (ref 0.0–0.1)
Basophils Relative: 0.7 % (ref 0.0–3.0)
Eosinophils Absolute: 0.3 10*3/uL (ref 0.0–0.7)
Eosinophils Relative: 3.2 % (ref 0.0–5.0)
HCT: 37.7 % (ref 36.0–46.0)
Hemoglobin: 12.3 g/dL (ref 12.0–15.0)
Lymphocytes Relative: 18.8 % (ref 12.0–46.0)
Lymphs Abs: 1.6 10*3/uL (ref 0.7–4.0)
MCHC: 32.5 g/dL (ref 30.0–36.0)
MCV: 77 fl — ABNORMAL LOW (ref 78.0–100.0)
Monocytes Absolute: 0.6 10*3/uL (ref 0.1–1.0)
Monocytes Relative: 7.2 % (ref 3.0–12.0)
Neutro Abs: 5.9 10*3/uL (ref 1.4–7.7)
Neutrophils Relative %: 70.1 % (ref 43.0–77.0)
Platelets: 368 10*3/uL (ref 150.0–400.0)
RBC: 4.89 Mil/uL (ref 3.87–5.11)
RDW: 16.9 % — ABNORMAL HIGH (ref 11.5–14.6)
WBC: 8.4 10*3/uL (ref 4.5–10.5)

## 2012-05-16 MED ORDER — GABAPENTIN 100 MG PO CAPS: 100.0000 mg | ORAL_CAPSULE | Freq: Three times a day (TID) | ORAL | Status: DC

## 2012-05-16 MED ORDER — PREDNISONE (PAK) 10 MG PO TABS: ORAL_TABLET | ORAL | Status: DC

## 2012-05-16 NOTE — Patient Instructions (Addendum)
Be sure to take the pepcid  20 mg one at bedtime  Neurontin 100 mg one three time daily with meals  Reduce chlortrimeton as  You  Improve and see if drowsiness improves ( it should)  Please see patient coordinator before you leave today  to schedule sinus ct   Please remember to go to the lab  department downstairs for your tests - we will call you with the results when they are available.  Please schedule a follow up office visit in 6 weeks, call sooner if needed

## 2012-05-16 NOTE — Telephone Encounter (Signed)
Pharmacy is requesting a 90 day supply for Gabapentin. MW, pls advise if this is okay.

## 2012-05-16 NOTE — Progress Notes (Signed)
Subjective:    Patient ID: Kristin [name], female    DOB: [birth year]  MRN: [id number]  HPI  80 yowf never smoker with no h/o asthma/ cough as child but since 2000 cough daily previously evaluated in pulmonary clinic with neg mct and referred back  01/17/2012 by Dr Harlan Stains for same cough.  01/17/2012 1st pulmonary eval in [facility] daily cough x 13 years esp since September of 2013 typically worse in am's p start talking> prod of sev tbsps of clear mucus, no pattern as to what time of year, has to sleep at night with cough drop or two in her mouth> then able to sleep ok s premature awkening. Narcotic Cough meds  make it better - aware that she has hb but taking meds doesn't help the cough but takes prilosec one at bedtime and sometimes in am - has tried advair, singulair, allegra Chlortrimeton has worked the best. No real sob if not coughing. rec Stop hyzar and start valsartan 160/25 one daily in its place Prilosec 20 mg Take 30- 60 min before your first and last meals of the day and Pepcid 20 mg at bedtime Continue chortrimeton 4 mg one every 4 hours as needed for tickle (as for longer acting) Take delsym two tsp every 12 hours and supplement if needed   02/16/2012 f/u ov/Emonte Dieujuste cc cough better maybe 50% when remembers to take her meds correctly. No sob. rec No change in rx   05/16/2012 f/u ov/Zofia Peckinpaugh cc no further improvement in  cough with intermittent drainage/ cough watery and variably responsive to h1 and always better p prednisone, sometimes not c/w pepcid.         No obvious daytime variabilty or assoc excess mucus or cp or chest tightness, subjective wheeze overt sinus or hb symptoms. No unusual exp hx    Denies any obvious fluctuation of symptoms with weather or environmental changes or other aggravating or alleviating factors except as outlined above.     Kouffman Reflux v Neurogenic Cough Differentiator Reflux Comments  Do you awaken from a sound sleep coughing violently?                             With trouble breathing? no   Do you have choking episodes when you cannot  Get enough air, gasping for air ?              no   Do you usually cough when you lie down into  The bed, or when you just lie down to rest ?                          no   Do you usually cough after meals or eating?         Less now   Do you cough when (or after) you bend over?    some   GERD SCORE     Kouffman Reflux v Neurogenic Cough Differentiator Neurogenic   Do you more-or-less cough all day long? no   Does change of temperature make you cough? some   Does laughing or chuckling cause you to cough? yes   Do fumes (perfume, automobile fumes, burned  Toast, etc.,) cause you to cough ?      no   Does speaking, singing, or talking on the phone cause you to cough   ?  yes   Neurogenic/Airway score         ROS  The following are not active complaints unless bolded sore throat, dysphagia, dental problems, itching, sneezing,  nasal congestion or excess/ purulent secretions, ear ache,   fever, chills, sweats, unintended wt loss, pleuritic or exertional cp, hemoptysis,  orthopnea pnd or leg swelling, presyncope, palpitations, heartburn, abdominal pain, anorexia, nausea, vomiting, diarrhea  or change in bowel or urinary habits, change in stools or urine, dysuria,hematuria,  rash, arthralgias, visual complaints, headache, numbness weakness or ataxia or problems with walking or coordination,  change in mood/affect or memory.          Objective:   Physical Exam  05/16/2012  277 Wt Readings from Last 3 Encounters:  02/16/12 267 lb (121.11 kg)  01/17/12 261 lb (118.389 kg)   obese wf nad occ throat clearing  HEENT: nl dentition, turbinates, and orophanx which is pristine. Nl external ear canals without cough reflex   NECK :  without JVD/Nodes/TM/ nl carotid upstrokes bilaterally   LUNGS: no acc muscle use, clear to A and P bilaterally without reproducible cough on insp or  exp maneuvers   CV:  RRR  no s3 or murmur or increase in P2, no edema   ABD:  soft and nontender with nl excursion in the supine position. No bruits or organomegaly, bowel sounds nl  MS:  warm without deformities, calf tenderness, cyanosis or clubbing  SKIN: warm and dry without lesions    NEURO:  alert, approp, no deficits    cxr 03/25/11 No active cardiopulmonary process.        Assessment & Plan:

## 2012-05-17 ENCOUNTER — Encounter: Payer: Self-pay | Admitting: Internal Medicine

## 2012-05-17 LAB — ALLERGY PROFILE REGION II-DC, DE, MD, ~~LOC~~, VA
Allergen, D pternoyssinus,d7: <0.1 kU/L
Alternaria Alternata: <0.1 kU/L
Aspergillus fumigatus, m3: <0.1 kU/L
Bermuda Grass: <0.1 kU/L
Box Elder IgE: <0.1 kU/L
Cat Dander: <0.1 kU/L
Cladosporium Herbarum: <0.1 kU/L
Cockroach: <0.1 kU/L
Common Ragweed: 0.12 kU/L — ABNORMAL HIGH
D. farinae: <0.1 kU/L
Dog Dander: <0.1 kU/L
Elm IgE: <0.1 kU/L
IgE (Immunoglobulin E), Serum: 3.8 IU/mL (ref 0.0–180.0)
Johnson Grass: <0.1 kU/L
Lamb's Quarters: <0.1 kU/L
Meadow Grass: <0.1 kU/L
Oak: <0.1 kU/L
Pecan/Hickory Tree IgE: <0.1 kU/L

## 2012-05-18 ENCOUNTER — Telehealth: Payer: Self-pay | Admitting: Internal Medicine

## 2012-05-18 ENCOUNTER — Ambulatory Visit (INDEPENDENT_AMBULATORY_CARE_PROVIDER_SITE_OTHER)
Admission: RE | Admit: 2012-05-18 | Discharge: 2012-05-18 | Disposition: A | Discharge status: Home / Self Care | Payer: Medicare Other | Source: Ambulatory Visit | Attending: Internal Medicine | Admitting: Internal Medicine

## 2012-05-18 DIAGNOSIS — R059 Cough, unspecified: Secondary | ICD-10-CM

## 2012-05-18 DIAGNOSIS — R05 Cough: Secondary | ICD-10-CM

## 2012-05-18 NOTE — Telephone Encounter (Signed)
lmomtcb x 2  

## 2012-05-19 ENCOUNTER — Encounter: Payer: Self-pay | Admitting: Internal Medicine

## 2012-05-19 NOTE — Progress Notes (Signed)
Quick Note:  Spoke with pt and notified of results per Dr. Wert. Pt verbalized understanding and denied any questions.  ______ 

## 2012-05-19 NOTE — Telephone Encounter (Signed)
lmtcb

## 2012-05-22 NOTE — Progress Notes (Signed)
Quick Note:  Spoke with pt and notified of results per Dr. Wert. Pt verbalized understanding and denied any questions.  ______ 

## 2012-05-22 NOTE — Telephone Encounter (Signed)
I see where the pt was given ct results, but it looks like she is still needing lab results? Not sure if she has been giving these yet. LMTCB. Utting Bing, CMA

## 2012-05-22 NOTE — Telephone Encounter (Signed)
I called and spoke with the pt and notified of lab results  Pt verbalized understanding and states nothing further needed

## 2012-06-02 DIAGNOSIS — M25579 Pain in unspecified ankle and joints of unspecified foot: Secondary | ICD-10-CM | POA: Diagnosis not present

## 2012-06-02 DIAGNOSIS — M1710 Unilateral primary osteoarthritis, unspecified knee: Secondary | ICD-10-CM | POA: Diagnosis not present

## 2012-07-11 DIAGNOSIS — N181 Chronic kidney disease, stage 1: Secondary | ICD-10-CM | POA: Diagnosis not present

## 2012-07-11 DIAGNOSIS — F988 Other specified behavioral and emotional disorders with onset usually occurring in childhood and adolescence: Secondary | ICD-10-CM | POA: Diagnosis not present

## 2012-07-11 DIAGNOSIS — M949 Disorder of cartilage, unspecified: Secondary | ICD-10-CM | POA: Diagnosis not present

## 2012-07-11 DIAGNOSIS — M899 Disorder of bone, unspecified: Secondary | ICD-10-CM | POA: Diagnosis not present

## 2012-07-11 DIAGNOSIS — E1129 Type 2 diabetes mellitus with other diabetic kidney complication: Secondary | ICD-10-CM | POA: Diagnosis not present

## 2012-07-11 DIAGNOSIS — E785 Hyperlipidemia, unspecified: Secondary | ICD-10-CM | POA: Diagnosis not present

## 2012-07-11 DIAGNOSIS — F329 Major depressive disorder, single episode, unspecified: Secondary | ICD-10-CM | POA: Diagnosis not present

## 2012-07-11 DIAGNOSIS — I129 Hypertensive chronic kidney disease with stage 1 through stage 4 chronic kidney disease, or unspecified chronic kidney disease: Secondary | ICD-10-CM | POA: Diagnosis not present

## 2012-07-12 ENCOUNTER — Telehealth: Payer: Self-pay | Admitting: Internal Medicine

## 2012-07-12 NOTE — Telephone Encounter (Signed)
Attempted to call pt x's 3 to make next ov per recall.  PT never returned calls.  Mailed recall letter 07/12/12. Kristin [name]

## 2012-07-28 ENCOUNTER — Ambulatory Visit: Payer: Medicare Other | Admitting: Internal Medicine

## 2012-10-31 DIAGNOSIS — T148XXA Other injury of unspecified body region, initial encounter: Secondary | ICD-10-CM | POA: Diagnosis not present

## 2012-10-31 DIAGNOSIS — L03119 Cellulitis of unspecified part of limb: Secondary | ICD-10-CM | POA: Diagnosis not present

## 2012-10-31 DIAGNOSIS — L02519 Cutaneous abscess of unspecified hand: Secondary | ICD-10-CM | POA: Diagnosis not present

## 2012-12-02 ENCOUNTER — Inpatient Hospital Stay (HOSPITAL_COMMUNITY)
Admission: EM | Admit: 2012-12-02 | Discharge: 2012-12-03 | DRG: 309 | Disposition: A | Discharge status: Home / Self Care | Payer: Medicare Other | Attending: Cardiology | Admitting: Cardiology

## 2012-12-02 ENCOUNTER — Encounter (HOSPITAL_COMMUNITY): Payer: Self-pay | Admitting: Emergency Medicine

## 2012-12-02 ENCOUNTER — Emergency Department (HOSPITAL_COMMUNITY): Payer: Medicare Other

## 2012-12-02 DIAGNOSIS — F341 Dysthymic disorder: Secondary | ICD-10-CM | POA: Diagnosis present

## 2012-12-02 DIAGNOSIS — R000 Tachycardia, unspecified: Secondary | ICD-10-CM | POA: Diagnosis present

## 2012-12-02 DIAGNOSIS — D649 Anemia, unspecified: Secondary | ICD-10-CM | POA: Diagnosis present

## 2012-12-02 DIAGNOSIS — Z6841 Body Mass Index (BMI) 40.0 and over, adult: Secondary | ICD-10-CM | POA: Diagnosis not present

## 2012-12-02 DIAGNOSIS — R05 Cough: Secondary | ICD-10-CM | POA: Diagnosis present

## 2012-12-02 DIAGNOSIS — R059 Cough, unspecified: Secondary | ICD-10-CM | POA: Diagnosis present

## 2012-12-02 DIAGNOSIS — K7689 Other specified diseases of liver: Secondary | ICD-10-CM | POA: Diagnosis not present

## 2012-12-02 DIAGNOSIS — Z882 Allergy status to sulfonamides status: Secondary | ICD-10-CM

## 2012-12-02 DIAGNOSIS — E7800 Pure hypercholesterolemia, unspecified: Secondary | ICD-10-CM | POA: Diagnosis present

## 2012-12-02 DIAGNOSIS — Z7982 Long term (current) use of aspirin: Secondary | ICD-10-CM | POA: Diagnosis not present

## 2012-12-02 DIAGNOSIS — Z885 Allergy status to narcotic agent status: Secondary | ICD-10-CM

## 2012-12-02 DIAGNOSIS — I447 Left bundle-branch block, unspecified: Secondary | ICD-10-CM | POA: Diagnosis present

## 2012-12-02 DIAGNOSIS — I4891 Unspecified atrial fibrillation: Secondary | ICD-10-CM | POA: Diagnosis not present

## 2012-12-02 DIAGNOSIS — K219 Gastro-esophageal reflux disease without esophagitis: Secondary | ICD-10-CM | POA: Diagnosis present

## 2012-12-02 DIAGNOSIS — E119 Type 2 diabetes mellitus without complications: Secondary | ICD-10-CM | POA: Diagnosis present

## 2012-12-02 DIAGNOSIS — R7989 Other specified abnormal findings of blood chemistry: Secondary | ICD-10-CM | POA: Diagnosis present

## 2012-12-02 DIAGNOSIS — I10 Essential (primary) hypertension: Secondary | ICD-10-CM | POA: Diagnosis present

## 2012-12-02 DIAGNOSIS — G4733 Obstructive sleep apnea (adult) (pediatric): Secondary | ICD-10-CM | POA: Diagnosis present

## 2012-12-02 DIAGNOSIS — I517 Cardiomegaly: Secondary | ICD-10-CM | POA: Diagnosis not present

## 2012-12-02 DIAGNOSIS — E785 Hyperlipidemia, unspecified: Secondary | ICD-10-CM

## 2012-12-02 DIAGNOSIS — I480 Paroxysmal atrial fibrillation: Secondary | ICD-10-CM

## 2012-12-02 DIAGNOSIS — R079 Chest pain, unspecified: Secondary | ICD-10-CM | POA: Diagnosis not present

## 2012-12-02 HISTORY — DX: Cough: R05

## 2012-12-02 HISTORY — DX: Paroxysmal atrial fibrillation: I48.0

## 2012-12-02 HISTORY — DX: Chronic cough: R05.3

## 2012-12-02 LAB — COMPREHENSIVE METABOLIC PANEL
ALT: 52 U/L — ABNORMAL HIGH (ref 0–35)
AST: 50 U/L — ABNORMAL HIGH (ref 0–37)
Albumin: 3.5 g/dL (ref 3.5–5.2)
Alkaline Phosphatase: 72 U/L (ref 39–117)
BUN: 17 mg/dL (ref 6–23)
CO2: 28 mEq/L (ref 19–32)
Calcium: 10.6 mg/dL — ABNORMAL HIGH (ref 8.4–10.5)
Chloride: 99 mEq/L (ref 96–112)
Creatinine, Ser: 0.84 mg/dL (ref 0.50–1.10)
GFR calc Af Amer: 89 mL/min — ABNORMAL LOW (ref >90)
GFR calc non Af Amer: 77 mL/min — ABNORMAL LOW (ref >90)
Glucose, Bld: 144 mg/dL — ABNORMAL HIGH (ref 70–99)
Potassium: 4.4 mEq/L (ref 3.5–5.1)
Sodium: 140 mEq/L (ref 135–145)
Total Bilirubin: 0.4 mg/dL (ref 0.3–1.2)
Total Protein: 6.8 g/dL (ref 6.0–8.3)

## 2012-12-02 LAB — CBC WITH DIFFERENTIAL/PLATELET
Basophils Absolute: 0.1 10*3/uL (ref 0.0–0.1)
Basophils Relative: 1 % (ref 0–1)
Eosinophils Absolute: 0.7 10*3/uL (ref 0.0–0.7)
Eosinophils Relative: 8 % — ABNORMAL HIGH (ref 0–5)
HCT: 40 % (ref 36.0–46.0)
Hemoglobin: 12.9 g/dL (ref 12.0–15.0)
Lymphocytes Relative: 22 % (ref 12–46)
Lymphs Abs: 1.7 10*3/uL (ref 0.7–4.0)
MCH: 25.6 pg — ABNORMAL LOW (ref 26.0–34.0)
MCHC: 32.3 g/dL (ref 30.0–36.0)
MCV: 79.5 fL (ref 78.0–100.0)
Monocytes Absolute: 0.5 10*3/uL (ref 0.1–1.0)
Monocytes Relative: 6 % (ref 3–12)
Neutro Abs: 4.9 10*3/uL (ref 1.7–7.7)
Neutrophils Relative %: 63 % (ref 43–77)
Platelets: 316 10*3/uL (ref 150–400)
RBC: 5.03 MIL/uL (ref 3.87–5.11)
RDW: 16.4 % — ABNORMAL HIGH (ref 11.5–15.5)
WBC: 7.8 10*3/uL (ref 4.0–10.5)

## 2012-12-02 LAB — T4: T4, Total: 7.5 ug/dL (ref 5.0–12.5)

## 2012-12-02 LAB — TROPONIN I
Troponin I: <0.3 ng/mL (ref <0.30)
Troponin I: <0.3 ng/mL (ref <0.30)
Troponin I: <0.3 ng/mL (ref <0.30)

## 2012-12-02 LAB — GLUCOSE, CAPILLARY
Glucose-Capillary: 102 mg/dL — ABNORMAL HIGH (ref 70–99)
Glucose-Capillary: 139 mg/dL — ABNORMAL HIGH (ref 70–99)

## 2012-12-02 LAB — MAGNESIUM: Magnesium: 2.4 mg/dL (ref 1.5–2.5)

## 2012-12-02 LAB — TSH: TSH: 1.991 u[IU]/mL (ref 0.350–4.500)

## 2012-12-02 MED ORDER — AMIODARONE HCL IN DEXTROSE 360-4.14 MG/200ML-% IV SOLN: 60.0000 mg/h | INTRAVENOUS | Status: DC

## 2012-12-02 MED ORDER — SODIUM CHLORIDE 0.9 % IV BOLUS (SEPSIS)
500.0000 mL | Freq: Once | INTRAVENOUS | Status: AC
  Administered 2012-12-02: 500 mL via INTRAVENOUS

## 2012-12-02 MED ORDER — DILTIAZEM HCL 25 MG/5ML IV SOLN: 5.0000 mg | Freq: Once | INTRAVENOUS | Status: DC

## 2012-12-02 MED ORDER — EXENATIDE 10 MCG/0.04ML ~~LOC~~ SOPN
10.0000 ug | PEN_INJECTOR | Freq: Two times a day (BID) | SUBCUTANEOUS | Status: DC
Start: 2012-12-03 — End: 2012-12-03

## 2012-12-02 MED ORDER — HEPARIN (PORCINE) IN NACL 100-0.45 UNIT/ML-% IJ SOLN
1200.0000 [IU]/h | INTRAMUSCULAR | Status: DC
  Administered 2012-12-02: 1200 [IU]/h via INTRAVENOUS
  Filled 2012-12-02 (×2): qty 250

## 2012-12-02 MED ORDER — ACETAMINOPHEN 325 MG PO TABS
650.0000 mg | ORAL_TABLET | ORAL | Status: DC | PRN
  Administered 2012-12-03: 650 mg via ORAL
  Filled 2012-12-02: qty 2

## 2012-12-02 MED ORDER — SODIUM CHLORIDE 0.9 % IJ SOLN: 3.0000 mL | INTRAMUSCULAR | Status: DC | PRN

## 2012-12-02 MED ORDER — ASPIRIN EC 81 MG PO TBEC
81.0000 mg | DELAYED_RELEASE_TABLET | Freq: Every day | ORAL | Status: DC
  Administered 2012-12-03: 81 mg via ORAL
  Filled 2012-12-02: qty 1

## 2012-12-02 MED ORDER — PANTOPRAZOLE SODIUM 40 MG PO TBEC
40.0000 mg | DELAYED_RELEASE_TABLET | Freq: Two times a day (BID) | ORAL | Status: DC
  Administered 2012-12-03: 40 mg via ORAL
  Filled 2012-12-02: qty 1

## 2012-12-02 MED ORDER — DEXTROSE 5 % IV SOLN: 30.0000 mg/h | Freq: Once | INTRAVENOUS | Status: DC

## 2012-12-02 MED ORDER — DILTIAZEM HCL 25 MG/5ML IV SOLN
10.0000 mg | Freq: Once | INTRAVENOUS | Status: AC
  Administered 2012-12-02: 5 mg via INTRAVENOUS
  Filled 2012-12-02: qty 5

## 2012-12-02 MED ORDER — NITROGLYCERIN 0.4 MG SL SUBL: 0.4000 mg | SUBLINGUAL_TABLET | SUBLINGUAL | Status: DC | PRN

## 2012-12-02 MED ORDER — SODIUM CHLORIDE 0.9 % IJ SOLN
3.0000 mL | Freq: Two times a day (BID) | INTRAMUSCULAR | Status: DC
  Administered 2012-12-03: 3 mL via INTRAVENOUS

## 2012-12-02 MED ORDER — ATORVASTATIN CALCIUM 10 MG PO TABS
10.0000 mg | ORAL_TABLET | Freq: Every day | ORAL | Status: DC
  Administered 2012-12-02: 10 mg via ORAL
  Filled 2012-12-02 (×3): qty 1

## 2012-12-02 MED ORDER — ONDANSETRON HCL 4 MG/2ML IJ SOLN: 4.0000 mg | Freq: Four times a day (QID) | INTRAMUSCULAR | Status: DC | PRN

## 2012-12-02 MED ORDER — LORATADINE 10 MG PO TABS
10.0000 mg | ORAL_TABLET | Freq: Every day | ORAL | Status: DC
  Administered 2012-12-03: 10 mg via ORAL
  Filled 2012-12-02 (×2): qty 1

## 2012-12-02 MED ORDER — SODIUM CHLORIDE 0.9 % IV SOLN
Freq: Once | INTRAVENOUS | Status: AC
  Administered 2012-12-02: 11:00:00 via INTRAVENOUS

## 2012-12-02 MED ORDER — AMIODARONE HCL IN DEXTROSE 360-4.14 MG/200ML-% IV SOLN: 30.0000 mg/h | INTRAVENOUS | Status: DC

## 2012-12-02 MED ORDER — CLONAZEPAM 1 MG PO TABS: 1.0000 mg | ORAL_TABLET | Freq: Every evening | ORAL | Status: DC | PRN

## 2012-12-02 MED ORDER — NON FORMULARY: 4.0000 mg | Freq: Two times a day (BID) | Status: DC

## 2012-12-02 MED ORDER — SODIUM CHLORIDE 0.9 % IV SOLN: 250.0000 mL | INTRAVENOUS | Status: DC | PRN

## 2012-12-02 MED ORDER — HEPARIN BOLUS VIA INFUSION
4000.0000 [IU] | Freq: Once | INTRAVENOUS | Status: AC
  Administered 2012-12-02: 4000 [IU] via INTRAVENOUS
  Filled 2012-12-02: qty 4000

## 2012-12-02 MED ORDER — AMIODARONE LOAD VIA INFUSION
150.0000 mg | Freq: Once | INTRAVENOUS | Status: DC
  Filled 2012-12-02: qty 83.34

## 2012-12-02 MED ORDER — FAMOTIDINE 20 MG PO TABS
20.0000 mg | ORAL_TABLET | Freq: Every day | ORAL | Status: DC
  Administered 2012-12-02: 20 mg via ORAL
  Filled 2012-12-02 (×3): qty 1

## 2012-12-02 MED ORDER — SODIUM CHLORIDE 0.9 % IV SOLN
Freq: Once | INTRAVENOUS | Status: AC
  Administered 2012-12-02: 12:00:00 via INTRAVENOUS

## 2012-12-02 MED ORDER — SODIUM CHLORIDE 0.9 % IV BOLUS (SEPSIS)
1000.0000 mL | Freq: Once | INTRAVENOUS | Status: AC
  Administered 2012-12-02: 1000 mL via INTRAVENOUS

## 2012-12-02 MED ORDER — VITAMIN D3 25 MCG (1000 UNIT) PO TABS
1000.0000 [IU] | ORAL_TABLET | Freq: Every day | ORAL | Status: DC
  Administered 2012-12-03: 1000 [IU] via ORAL
  Filled 2012-12-02 (×3): qty 1

## 2012-12-02 MED ORDER — ADULT MULTIVITAMIN W/MINERALS CH
1.0000 | ORAL_TABLET | Freq: Every day | ORAL | Status: DC
  Administered 2012-12-03: 1 via ORAL
  Filled 2012-12-02 (×2): qty 1

## 2012-12-02 MED ORDER — INSULIN ASPART 100 UNIT/ML ~~LOC~~ SOLN: 0.0000 [IU] | Freq: Three times a day (TID) | SUBCUTANEOUS | Status: DC

## 2012-12-02 MED ORDER — ESCITALOPRAM OXALATE 20 MG PO TABS
20.0000 mg | ORAL_TABLET | Freq: Every day | ORAL | Status: DC
  Administered 2012-12-02 – 2012-12-03 (×2): 20 mg via ORAL
  Filled 2012-12-02 (×3): qty 1
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
## 2012-12-02 NOTE — Progress Notes (Signed)
As noted patient converted to NSR prior to getting any amiodarone.  We will await echo results and consider adding low dose beta blocker or diltiazem orally prior to discharge. If she rules out for an MI she may be able to go home tomorrow and have her myoview as an outpatient in the office. With her body habitus she will probably be a two day protocol. Will recheck EKG in am to see if her LBBB still present or whether rate-dependent.

## 2012-12-02 NOTE — Progress Notes (Addendum)
Was informed by nursing staff that pt converted to NSR before she had received any amiodarone at all. (Had gotten diltiazem pushes in ED earlier.) D/w Dr. Mare Ferrari. Will follow rhythm/BP before adding any further agents. Changed status to obs/tele bed. [name] Dunn PA-C
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
## 2012-12-02 NOTE — ED Notes (Addendum)
Received pt via EMS with c/o woke up at 0900 with pain on both sides of her neck. Pt drove to fire station. Pt was clammy, c/o funny feeling in chest, skipping beats, shortness of breath and intermittent left sided chest pain. Pt took 2 ASA (675m) and was given 1 nitro by EMS along with O2.

## 2012-12-02 NOTE — ED Notes (Signed)
Diet tray ordered. Awaiting bed assignment.
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
## 2012-12-03 ENCOUNTER — Encounter (HOSPITAL_COMMUNITY): Payer: Self-pay | Admitting: Physician Assistant

## 2012-12-03 DIAGNOSIS — R7989 Other specified abnormal findings of blood chemistry: Secondary | ICD-10-CM

## 2012-12-03 DIAGNOSIS — E785 Hyperlipidemia, unspecified: Secondary | ICD-10-CM

## 2012-12-03 DIAGNOSIS — K7689 Other specified diseases of liver: Secondary | ICD-10-CM | POA: Diagnosis not present

## 2012-12-03 DIAGNOSIS — I447 Left bundle-branch block, unspecified: Secondary | ICD-10-CM

## 2012-12-03 DIAGNOSIS — G4733 Obstructive sleep apnea (adult) (pediatric): Secondary | ICD-10-CM

## 2012-12-03 DIAGNOSIS — E7800 Pure hypercholesterolemia, unspecified: Secondary | ICD-10-CM

## 2012-12-03 DIAGNOSIS — I517 Cardiomegaly: Secondary | ICD-10-CM

## 2012-12-03 DIAGNOSIS — E119 Type 2 diabetes mellitus without complications: Secondary | ICD-10-CM

## 2012-12-03 DIAGNOSIS — D649 Anemia, unspecified: Secondary | ICD-10-CM

## 2012-12-03 DIAGNOSIS — I4891 Unspecified atrial fibrillation: Secondary | ICD-10-CM | POA: Diagnosis not present

## 2012-12-03 DIAGNOSIS — Z6841 Body Mass Index (BMI) 40.0 and over, adult: Secondary | ICD-10-CM | POA: Diagnosis not present

## 2012-12-03 HISTORY — PX: TRANSTHORACIC ECHOCARDIOGRAM: SHX275

## 2012-12-03 LAB — CBC
HCT: 33.2 % — ABNORMAL LOW (ref 36.0–46.0)
Hemoglobin: 10.6 g/dL — ABNORMAL LOW (ref 12.0–15.0)
MCH: 25.4 pg — ABNORMAL LOW (ref 26.0–34.0)
MCHC: 31.9 g/dL (ref 30.0–36.0)
MCV: 79.6 fL (ref 78.0–100.0)
Platelets: 256 10*3/uL (ref 150–400)
RBC: 4.17 MIL/uL (ref 3.87–5.11)
RDW: 16.5 % — ABNORMAL HIGH (ref 11.5–15.5)
WBC: 7.8 10*3/uL (ref 4.0–10.5)

## 2012-12-03 LAB — LIPID PANEL
Cholesterol: 144 mg/dL (ref 0–200)
HDL: 42 mg/dL (ref >39)
LDL Cholesterol: 57 mg/dL (ref 0–99)
Total CHOL/HDL Ratio: 3.4 RATIO
Triglycerides: 225 mg/dL — ABNORMAL HIGH (ref <150)
VLDL: 45 mg/dL — ABNORMAL HIGH (ref 0–40)

## 2012-12-03 LAB — GLUCOSE, CAPILLARY
Glucose-Capillary: 131 mg/dL — ABNORMAL HIGH (ref 70–99)
Glucose-Capillary: 138 mg/dL — ABNORMAL HIGH (ref 70–99)

## 2012-12-03 LAB — COMPREHENSIVE METABOLIC PANEL
ALT: 46 U/L — ABNORMAL HIGH (ref 0–35)
AST: 46 U/L — ABNORMAL HIGH (ref 0–37)
Albumin: 3 g/dL — ABNORMAL LOW (ref 3.5–5.2)
Alkaline Phosphatase: 56 U/L (ref 39–117)
BUN: 15 mg/dL (ref 6–23)
CO2: 29 mEq/L (ref 19–32)
Calcium: 9 mg/dL (ref 8.4–10.5)
Chloride: 103 mEq/L (ref 96–112)
Creatinine, Ser: 0.83 mg/dL (ref 0.50–1.10)
GFR calc Af Amer: 90 mL/min — ABNORMAL LOW (ref >90)
GFR calc non Af Amer: 77 mL/min — ABNORMAL LOW (ref >90)
Glucose, Bld: 145 mg/dL — ABNORMAL HIGH (ref 70–99)
Potassium: 3.8 mEq/L (ref 3.5–5.1)
Sodium: 139 mEq/L (ref 135–145)
Total Bilirubin: 0.3 mg/dL (ref 0.3–1.2)
Total Protein: 5.6 g/dL — ABNORMAL LOW (ref 6.0–8.3)

## 2012-12-03 LAB — TROPONIN I: Troponin I: <0.3 ng/mL (ref <0.30)

## 2012-12-03 LAB — HEPARIN LEVEL (UNFRACTIONATED)
Heparin Unfractionated: 0.3 IU/mL (ref 0.30–0.70)
Heparin Unfractionated: 0.32 IU/mL (ref 0.30–0.70)

## 2012-12-03 MED ORDER — NITROGLYCERIN 0.4 MG SL SUBL: 0.4000 mg | SUBLINGUAL_TABLET | SUBLINGUAL | Status: DC | PRN

## 2012-12-03 MED ORDER — ASPIRIN 81 MG PO TBEC: 81.0000 mg | DELAYED_RELEASE_TABLET | Freq: Every day | ORAL | Status: DC

## 2012-12-03 MED ORDER — RIVAROXABAN 20 MG PO TABS: 20.0000 mg | ORAL_TABLET | Freq: Every day | ORAL | Status: DC

## 2012-12-03 MED ORDER — RIVAROXABAN 20 MG PO TABS
20.0000 mg | ORAL_TABLET | Freq: Every day | ORAL | Status: DC
  Administered 2012-12-03: 20 mg via ORAL
  Filled 2012-12-03 (×2): qty 1

## 2012-12-03 MED ORDER — EXENATIDE 10 MCG/0.04ML ~~LOC~~ SOPN: 10.0000 ug | PEN_INJECTOR | Freq: Two times a day (BID) | SUBCUTANEOUS | Status: DC

## 2012-12-03 MED ORDER — METOPROLOL SUCCINATE 12.5 MG HALF TABLET
12.5000 mg | ORAL_TABLET | Freq: Every day | ORAL | Status: DC
  Filled 2012-12-03: qty 1

## 2012-12-03 MED ORDER — METOPROLOL SUCCINATE 12.5 MG HALF TABLET: 12.5000 mg | ORAL_TABLET | Freq: Every day | ORAL | Status: DC

## 2012-12-03 NOTE — Progress Notes (Signed)
Utilization Review completed.  

## 2012-12-03 NOTE — Progress Notes (Signed)
Subjective:  The patient has remained in normal sinus rhythm overnight.  EKG today is normal.  The previous left bundle-branch block is no longer present.  Cardiac enzymes show no evidence of myocardial infarction.  Echocardiogram not yet done.  Objective:  Vital Signs in the last 24 hours: Temp:  [97.8 F (36.6 C)-98.9 F (37.2 C)] 98.5 F (36.9 C) (09/07 0500) Pulse Rate:  [25-152] 69 (09/07 0500) Resp:  [15-28] 16 (09/07 0500) BP: (71-115)/(33-87) 102/47 mmHg (09/07 0500) SpO2:  [93 %-100 %] 96 % (09/07 0500) Weight:  [277 lb 9 oz (125.9 kg)] 277 lb 9 oz (125.9 kg) (09/06 2300)  Intake/Output from previous day: 09/06 0701 - 09/07 0700 In: 240 [P.O.:120; I.V.:120] Out: -  Intake/Output from this shift:    . aspirin EC  81 mg Oral Daily  . atorvastatin  10 mg Oral q1800  . cholecalciferol  1,000 Units Oral Daily  . escitalopram  20 mg Oral Daily  . exenatide  10 mcg Subcutaneous BID WC  . famotidine  20 mg Oral QHS  . insulin aspart  0-9 Units Subcutaneous TID WC  . loratadine  10 mg Oral Daily  . metoprolol succinate  12.5 mg Oral Daily  . multivitamin with minerals  1 tablet Oral Daily  . pantoprazole  40 mg Oral BID AC  . sodium chloride  3 mL Intravenous Q12H   . heparin 1,200 Units/hr (12/02/12 1804)    Physical Exam: The patient appears to be in no distress.  Head and neck exam reveals that the pupils are equal and reactive.  The extraocular movements are full.  There is no scleral icterus.  Mouth and pharynx are benign.  No lymphadenopathy.  No carotid bruits.  The jugular venous pressure is normal.  Thyroid is not enlarged or tender.  Chest is clear to percussion and auscultation.  No rales or rhonchi.  Expansion of the chest is symmetrical.  Heart reveals no abnormal lift or heave.  First and second heart sounds are normal.  There is no murmur gallop rub or click.  The abdomen is obese, soft and nontender.  Bowel sounds are normoactive.  There is no  hepatosplenomegaly or mass.  There are no abdominal bruits.  Extremities reveal no phlebitis or edema.  Pedal pulses are good.  There is no cyanosis or clubbing.  Neurologic exam is normal strength and no lateralizing weakness.  No sensory deficits.  Integument reveals no rash  Lab Results:  Recent Labs  12/02/12 1058 12/03/12 0520  WBC 7.8 7.8  HGB 12.9 10.6*  PLT 316 256    Recent Labs  12/02/12 1058 12/03/12 0520  NA 140 139  K 4.4 3.8  CL 99 103  CO2 28 29  GLUCOSE 144* 145*  BUN 17 15  CREATININE 0.84 0.83    Recent Labs  12/02/12 2313 12/03/12 0520  TROPONINI <0.30 <0.30   Hepatic Function Panel  Recent Labs  12/03/12 0520  PROT 5.6*  ALBUMIN 3.0*  AST 46*  ALT 46*  ALKPHOS 56  BILITOT 0.3    Recent Labs  12/03/12 0520  CHOL 144   No results found for this basename: PROTIME,  in the last 72 hours  Imaging: Dg Chest Portable 1 View  12/02/2012   *RADIOLOGY REPORT*  Clinical Data: Code stenting  PORTABLE CHEST - 1 VIEW  Comparison: Prior chest x-ray 03/25/2011  Findings: Low inspiratory volumes with mild bibasilar atelectasis. Cardiac and mediastinal contours are unchanged.  No overt pulmonary  edema, consolidation, pleural effusion or pneumothorax.  No acute osseous abnormality.  IMPRESSION: No acute cardiopulmonary process.   Original Report Authenticated By: Jacqulynn Cadet, M.D.    Cardiac Studies: Telemetry shows normal sinus rhythm.  Followup EKG today shows no left bundle branch block. Assessment/Plan:  1. New onset rapid atrial fibrillation, converted in the emergency room back to normal sinus rhythm prior to starting amiodarone. CHADS2VASC score is 3  2. rate related left bundle branch block now absent with restoration of normal sinus rhythm. 3. HTN, with blood pressures normal in-hospital 4. Diabetes mellitus - continue home meds, resume metformin at discharge    5. Morbid obesity -  recommend weight loss  6. Hypercholesterolemia -  check lipids and continue statin.  7. Chronic cough, previously seen by Dr. Melvyn Novas - f/u Dr. Melvyn Novas or PCP at dc.  8. Mild hypercalcemia - stop calcium supplement and follow.  9. Mild transaminitis stable.  Continue statin at discharge.  Elevated liver function tests may be secondary to fatty liver. 10. OSA - cont CPAP qhs. 11.  Tachycardia dependent left bundle branch block, resolved.  Plan: Await results of today's echo and if satisfactory she can be discharged later today.  We will continue home medications at discharge.  We are adding low-dose beta blocker to help prevent future episodes of atrial fib.  We are also starting Xarelto. Hemoglobin today is slightly lower and we will want to follow this up at transition of care visit in one week with a CBC.  Continue PPI at discharge To evaluate her rate related throat discomfort and the risk factors for ischemic heart disease we will have her return to the office for an outpatient two-day protocol of Lexa scan Myoview.   LOS: 1 day    Kristin [name] 12/03/2012, 9:29 AM

## 2012-12-03 NOTE — Progress Notes (Signed)
RT placed patient on cpap 8cmH20 via nasal mask. Paitent is tolerating cpap well at this time. RT will continue to monitor.

## 2012-12-03 NOTE — Discharge Summary (Signed)
Discharge Summary   Patient ID: Kristin [name],  MRN: [id number], DOB/AGE: Jan 27, 1956 57 y.o.  Admit date: 12/02/2012 Discharge date: 12/03/2012  Primary Physician: Vidal Schwalbe, MD Primary Cardiologist: New- T. Brackbill, MD  Discharge Diagnoses Principal Problem:   New onset atrial fibrillation Active Problems:   LBBB (left bundle branch block)   Type 2 diabetes mellitus   Morbid obesity   Hypercalcemia   Elevated LFTs   OSA on CPAP   Hypercholesterolemia   Normocytic anemia   Allergies Allergies  Allergen Reactions  . Clinoril [Sulindac]     Yeast infection/itching  . Dilaudid [Hydromorphone Hcl]     Changed respirations  . Keflex [Cephalexin]     Yeast infection/itching  . Sulfa Antibiotics     Yeast infection/itching  . Tramadol     itching    Diagnostic Studies/Procedures  PORTABLE CHEST X-RAY - 12/02/12  IMPRESSION:  No acute cardiopulmonary process.   History of Present Illness Kristin [name] is a 57 y.o. female who was admitted to [facility] hospital 9/6 to 9/7 with the above problem list.   She has no prior cardiac history and a PMHx s/f DM2, HTN, OSA on CPAP and morbid obesity  She reported experiencing throat burning and indigestion (typically controlled by Prilosec), palpitations, shortness of breath and diaphoresis the morning of admission. She took antacids w/o relief. Her pulse felt irregular prompting her to take two full dose ASA. She had been in her USOH the day prior. She sought an urgent care only to find that it was closed. She presented to a nearby fire department and was transported to the [facility] ED.   There, EKG revealed an irregular, wide complex tachycardia, 166 bpm with LAD and LBBB pattern identified as atrial fibrillation with RVR and LBBB of unclear duration. Initial trop-I returned WNL. TSH and free T4 returned WNL. She received diltiazem bolus + infusion with adequate rate-control. The plan was made to start amiodarone, however the  patient converted and sustained NSR with narrow QRS complexes. CHADSVASc score was calculated at 3 warranting anticoagulation, and hence Xarelto was started.   Additional labwork revealed a mild hypercalcemia at 10.6, mild transaminitis (AST 50/ALT 52). BMET and CBC were largely unremarkable. CXR as above indicated no acute cardiopulmonary process.   Hospital Course   She was monitored on telemetry overnight. She sustained NSR. Toprol-XL was added for rate-control. Three subsequent troponins returned WNL. Lipid panel indicated LDL 57, HDL 52, TG 225, TC 144. CMET- Ca 10.3, AST 46, ALT 46, albumin 3.0. CBC indicated a significant drop in Hgb from 12.9 to 10.6 (Hct 40->33.2). She remained stable and asymptomatic overnight. She was evaluated by Dr. Mare Ferrari this morning who suspected the patient's transaminitis was secondary to fatty liver. He deemed the patient to be overall stable for discharge with close follow-up in the clinic including CBC in 1 week to evaluate H/H. He recommended an outpatient 2 day Lexiscan Myoview for ischemic evaluation. LBBB was suspected to be rate-dependent.   She will be discharged on the medication regimen outlined below. She will follow-up within 7 days as part of transitional care management. BMET (check Ca++) and CBC will be checked at that time. She has been advised to follow-up with her PCP regarding mildly elevated liver enzymes. She will be scheduled for a [employer]. This information, including outpatient stress test instructions, has been clearly outlined in the discharge AVS.   Discharge Vitals:  Blood pressure 102/47, pulse 69, temperature 98.5 F (36.9 C),  temperature source Oral, resp. rate 16, height 5' 4"  (1.626 m), weight 125.9 kg (277 lb 9 oz), SpO2 96.00%.   Labs: Recent Labs     12/02/12  1058  12/03/12  0520  WBC  7.8  7.8  HGB  12.9  10.6*  HCT  40.0  33.2*  MCV  79.5  79.6  PLT  316  256    Recent Labs Lab 12/02/12 1058  12/03/12 0520  NA 140 139  K 4.4 3.8  CL 99 103  CO2 28 29  BUN 17 15  CREATININE 0.84 0.83  CALCIUM 10.6* 9.0  PROT 6.8 5.6*  BILITOT 0.4 0.3  ALKPHOS 72 56  ALT 52* 46*  AST 50* 46*  GLUCOSE 144* 145*   Recent Labs     12/02/12  1726  12/02/12  2313  12/03/12  0520  TROPONINI  <0.30  <0.30  <0.30   Recent Labs     12/03/12  0520  CHOL  144  HDL  42  LDLCALC  57  TRIG  225*  CHOLHDL  3.4    Recent Labs  12/02/12 1058  TSH 1.991  T4TOTAL 7.5   Disposition:       Follow-up Information   Follow up with [facility] In 1 week. (Office will call you with in an appointment date & time. )    Contact information:   [place] [id number]       Follow up with Vidal Schwalbe, MD. Schedule an appointment as soon as possible for a visit in 1 week. (For general post-hospital follow-up. )    Specialty:  Family Medicine   Contact information:   [place] [address] [phone]      Discharge Medications:    Medication List    STOP taking these medications       CALCIUM PO     valsartan-hydrochlorothiazide 160-25 MG per tablet  Commonly known as:  DIOVAN HCT      TAKE these medications       aspirin 81 MG EC tablet  Take 1 tablet (81 mg total) by mouth daily.     chlorpheniramine 4 MG tablet  Commonly known as:  CHLOR-TRIMETON  Take 4 mg by mouth 2 (two) times daily.     cholecalciferol 1000 UNITS tablet  Commonly known as:  VITAMIN D  Take 1,000 Units by mouth daily.     clonazePAM 1 MG tablet  Commonly known as:  KLONOPIN  Take 1 mg by mouth at bedtime as needed for anxiety.     escitalopram 20 MG tablet  Commonly known as:  LEXAPRO  Take 20 mg by mouth daily.     exenatide 10 MCG/0.04ML Sopn injection  Commonly known as:  BYETTA  Inject 10 mcg into the skin 2 (two) times daily with a meal.     famotidine 20 MG tablet  Commonly known as:  PEPCID  Take 20 mg by mouth at bedtime.      metFORMIN 1000 MG tablet  Commonly known as:  GLUCOPHAGE  Take 1,000 mg by mouth 2 (two) times daily with a meal.     metoprolol succinate 12.5 mg Tb24 24 hr tablet  Commonly known as:  TOPROL-XL  Take 0.5 tablets (12.5 mg total) by mouth daily.     multivitamin with minerals Tabs tablet  Take 1 tablet by mouth daily.     nitroGLYCERIN 0.4 MG SL tablet  Commonly known as:  NITROSTAT  Place 1 tablet (0.4 mg total) under the tongue every 5 (five) minutes x 3 doses as needed for chest pain.     omeprazole 20 MG capsule  Commonly known as:  PRILOSEC  Take 20 mg by mouth 2 (two) times daily before a meal.     pravastatin 40 MG tablet  Commonly known as:  PRAVACHOL  Take 40 mg by mouth at bedtime.     Rivaroxaban 20 MG Tabs tablet  Commonly known as:  XARELTO  Take 1 tablet (20 mg total) by mouth daily with supper.       Outstanding Labs/Studies: BMET & CBC in 1 week; Lexiscan Myoview (2 day) in 1 week  Duration of Discharge Encounter: Greater than 30 minutes including physician time.  Signed, R. Valeria Batman, PA-C 12/03/2012, 1:43 PM

## 2012-12-03 NOTE — Progress Notes (Signed)
Pt had 5 bts wide QRS on tele while in bed.  Pt asymptomatic.

## 2012-12-03 NOTE — Progress Notes (Signed)
[place] for Heparin Indication: atrial fibrillation  Allergies  Allergen Reactions  . Clinoril [Sulindac]     Yeast infection/itching  . Dilaudid [Hydromorphone Hcl]     Changed respirations  . Keflex [Cephalexin]     Yeast infection/itching  . Sulfa Antibiotics     Yeast infection/itching  . Tramadol     itching    Patient Measurements: Height: 5' 4"  (162.6 cm) Weight: 277 lb 9 oz (125.9 kg) IBW/kg (Calculated) : 54.7  Vital Signs: Temp: 98.9 F (37.2 C) (09/06 2000) Temp src: Oral (09/06 2000) BP: 115/46 mmHg (09/06 2000) Pulse Rate: 75 (09/06 2000)  Labs:  Recent Labs  12/02/12 1058 12/02/12 1726 12/02/12 2313 12/03/12 0108  HGB 12.9  --   --   --   HCT 40.0  --   --   --   PLT 316  --   --   --   HEPARINUNFRC  --   --   --  0.30  CREATININE 0.84  --   --   --   TROPONINI <0.30 <0.30 <0.30  --     Estimated Creatinine Clearance: 98.2 ml/min (by C-G formula based on Cr of 0.84).   Assessment: 57 yo female with Afib for heparin  Goal of Therapy:  Heparin level 0.3-0.7 units/ml Monitor platelets by anticoagulation protocol: Yes   Plan:  Continue Heparin at current rate   Phillis Knack, PharmD, BCPS  12/03/2012 3:00 AM

## 2012-12-03 NOTE — Progress Notes (Signed)
ANTICOAGULATION CONSULT NOTE - Follow Up Consult  Pharmacy Consult for Heparin Indication: atrial fibrillation  Allergies  Allergen Reactions  . Clinoril [Sulindac]     Yeast infection/itching  . Dilaudid [Hydromorphone Hcl]     Changed respirations  . Keflex [Cephalexin]     Yeast infection/itching  . Sulfa Antibiotics     Yeast infection/itching  . Tramadol     itching    Patient Measurements: Height: 5' 4"  (162.6 cm) Weight: 277 lb 9 oz (125.9 kg) IBW/kg (Calculated) : 54.7 Heparin Dosing Weight: 85.6 kg  Vital Signs: Temp: 98.5 F (36.9 C) (09/07 0500) Temp src: Oral (09/07 0500) BP: 102/47 mmHg (09/07 0500) Pulse Rate: 69 (09/07 0500)  Labs:  Recent Labs  12/02/12 1058 12/02/12 1726 12/02/12 2313 12/03/12 0108 12/03/12 0520  HGB 12.9  --   --   --  10.6*  HCT 40.0  --   --   --  33.2*  PLT 316  --   --   --  256  HEPARINUNFRC  --   --   --  0.30 0.32  CREATININE 0.84  --   --   --  0.83  TROPONINI <0.30 <0.30 <0.30  --  <0.30    Estimated Creatinine Clearance: 99.4 ml/min (by C-G formula based on Cr of 0.83).   Medications:  Scheduled:  . aspirin EC  81 mg Oral Daily  . atorvastatin  10 mg Oral q1800  . cholecalciferol  1,000 Units Oral Daily  . escitalopram  20 mg Oral Daily  . exenatide  10 mcg Subcutaneous BID WC  . famotidine  20 mg Oral QHS  . insulin aspart  0-9 Units Subcutaneous TID WC  . loratadine  10 mg Oral Daily  . multivitamin with minerals  1 tablet Oral Daily  . pantoprazole  40 mg Oral BID AC  . sodium chloride  3 mL Intravenous Q12H   Infusions:  . heparin 1,200 Units/hr (12/02/12 1804)    Assessment: 57 yo morbidly obese F with no prior cardiac history presented to ED with throat discomfort, mild indigestion, and sensation of irregular heartbeat. She was found to have new onset afib. She spontaneous converted back to NSR without the planned amiodarone. Pharmacy consulted for heparin gtt.  Patient has been therapeutic on  Heparin on both lab draws since admission, this AM's coming back at 0.32. Renal function is good and stable, and no reports of bleeding have been noted. H/H has dropped since admission, possibly dilutional from fluids boluses/meds.  Will monitor trend.  Goal of Therapy:  Heparin level 0.3-0.7 units/ml Monitor platelets by anticoagulation protocol: Yes   Plan:  - continue Heparin IV gtt at current rate of 1200 units/hr - daily CBC and Hep lvl - f/u H/H trend - monitor for s/s of bleeding  Ovid Curd E. Jacqlyn Larsen, PharmD Clinical Pharmacist - Resident Pager: [phone] Pharmacy: [phone] 12/03/2012 8:59 AM

## 2012-12-03 NOTE — Progress Notes (Signed)
*  PRELIMINARY RESULTS* Echocardiogram 2D Echocardiogram has been performed.  Leavy Cella 12/03/2012, 11:08 AM
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
## 2012-12-04 LAB — HEMOGLOBIN A1C
Hgb A1c MFr Bld: 7 % — ABNORMAL HIGH (ref <5.7)
Mean Plasma Glucose: 154 mg/dL — ABNORMAL HIGH (ref <117)

## 2012-12-05 ENCOUNTER — Telehealth: Payer: Self-pay | Admitting: Cardiology

## 2012-12-05 NOTE — Telephone Encounter (Signed)
No cuff at home so unable to check blood pressure. Swelling in hands and this is why she was put the Losartan HCT to begin with. She does think swelling better after getting up moving around. Discussed with  Dr. Mare Ferrari and will watch her salt intake and continue current medications. If worse between now and follow up next week call back. Left message to call back

## 2012-12-05 NOTE — Telephone Encounter (Signed)
Dr. Mare Ferrari started me on metoprolol while in the hospital and removed valsartan-htzt.  Now having welling in hands. Do I need to be started on a water pill.

## 2012-12-06 NOTE — Telephone Encounter (Signed)
Advised patient

## 2012-12-08 ENCOUNTER — Encounter: Payer: Self-pay | Admitting: Cardiology

## 2012-12-08 DIAGNOSIS — G2581 Restless legs syndrome: Secondary | ICD-10-CM | POA: Diagnosis not present

## 2012-12-08 DIAGNOSIS — Z79899 Other long term (current) drug therapy: Secondary | ICD-10-CM | POA: Diagnosis not present

## 2012-12-08 DIAGNOSIS — F411 Generalized anxiety disorder: Secondary | ICD-10-CM | POA: Diagnosis not present

## 2012-12-08 DIAGNOSIS — R319 Hematuria, unspecified: Secondary | ICD-10-CM | POA: Diagnosis not present

## 2012-12-08 DIAGNOSIS — H9319 Tinnitus, unspecified ear: Secondary | ICD-10-CM | POA: Diagnosis not present

## 2012-12-08 DIAGNOSIS — E1129 Type 2 diabetes mellitus with other diabetic kidney complication: Secondary | ICD-10-CM | POA: Diagnosis not present

## 2012-12-08 DIAGNOSIS — R7401 Elevation of levels of liver transaminase levels: Secondary | ICD-10-CM | POA: Diagnosis not present

## 2012-12-08 DIAGNOSIS — I4891 Unspecified atrial fibrillation: Secondary | ICD-10-CM | POA: Diagnosis not present

## 2012-12-15 ENCOUNTER — Encounter: Payer: Self-pay | Admitting: Cardiology

## 2012-12-15 ENCOUNTER — Ambulatory Visit (INDEPENDENT_AMBULATORY_CARE_PROVIDER_SITE_OTHER): Payer: Medicare Other | Admitting: Cardiology

## 2012-12-15 VITALS — BP 152/86 | HR 76 | Ht 63.5 in | Wt 272.2 lb

## 2012-12-15 DIAGNOSIS — IMO0001 Reserved for inherently not codable concepts without codable children: Secondary | ICD-10-CM | POA: Diagnosis not present

## 2012-12-15 DIAGNOSIS — R079 Chest pain, unspecified: Secondary | ICD-10-CM | POA: Diagnosis not present

## 2012-12-15 DIAGNOSIS — E7800 Pure hypercholesterolemia, unspecified: Secondary | ICD-10-CM

## 2012-12-15 DIAGNOSIS — I4891 Unspecified atrial fibrillation: Secondary | ICD-10-CM | POA: Diagnosis not present

## 2012-12-15 DIAGNOSIS — I447 Left bundle-branch block, unspecified: Secondary | ICD-10-CM

## 2012-12-15 DIAGNOSIS — D649 Anemia, unspecified: Secondary | ICD-10-CM

## 2012-12-15 DIAGNOSIS — I119 Hypertensive heart disease without heart failure: Secondary | ICD-10-CM

## 2012-12-15 DIAGNOSIS — I480 Paroxysmal atrial fibrillation: Secondary | ICD-10-CM

## 2012-12-15 NOTE — Assessment & Plan Note (Signed)
The patient has precordial chest discomfort at rest as well as with exertion.  With exertion she also complains of bilateral throat pain.  She has tried sublingual nitroglycerin.  One time it helped and another time it did not seem to help.  She is also been experiencing some interscapular posterior chest pain.  She has multiple risk factors for ischemic heart disease.  We will have her return for a 2 day Lexa scan Myoview stress test to evaluate the symptoms further.

## 2012-12-15 NOTE — Assessment & Plan Note (Signed)
Tachycardia dependent left bundle branch block is no longer seen.

## 2012-12-15 NOTE — Progress Notes (Signed)
Kristin [name] Date of Birth:  05-Mar-1956 [facility] [address] [place], [place]  [address] [phone]         Fax   [phone]  History of Present Illness: This pleasant 57 year old woman is seen for a scheduled post hospital followup office visit.  The patient was hospitalized from 12/02/2012 until 12/03/2012.  She presented with new-onset rapid atrial fibrillation.  Her EKG with rapid atrial fibrillation showed left bundle branch block.  There was no prior history of bundle branch block.  She converted in the emergency room back to sinus rhythm prior to starting IV amiodarone.  Her followup EKG with normal sinus rhythm showed resolution of left bundle branch block.  Her chadds-vasc score is 3 and she was discharged on Xarelto.  She has a history of hypertension and diabetes mellitus and morbid obesity and hypercholesterolemia.  She also has a history of obstructive sleep apnea and uses a CPAP machine at home.   Current Outpatient Prescriptions  Medication Sig Dispense Refill  . aspirin EC 81 MG EC tablet Take 1 tablet (81 mg total) by mouth daily.      . chlorpheniramine (CHLOR-TRIMETON) 4 MG tablet Take 4 mg by mouth 2 (two) times daily.       . cholecalciferol (VITAMIN D) 1000 UNITS tablet Take 1,000 Units by mouth daily.      [name] Kitchen escitalopram (LEXAPRO) 20 MG tablet Take 20 mg by mouth daily.      [name] Kitchen exenatide (BYETTA) 10 MCG/0.04ML SOPN injection Inject 10 mcg into the skin 2 (two) times daily with a meal.      . famotidine (PEPCID) 20 MG tablet Take 20 mg by mouth at bedtime.      [name] Kitchen LORazepam (ATIVAN) 1 MG tablet Take 1 mg by mouth daily.      . metFORMIN (GLUCOPHAGE) 1000 MG tablet Take 1,000 mg by mouth 2 (two) times daily with a meal.      . metoprolol succinate (TOPROL-XL) 12.5 mg TB24 24 hr tablet Take 0.5 tablets (12.5 mg total) by mouth daily.  30 tablet  2  . Multiple Vitamin (MULTIVITAMIN WITH MINERALS) TABS tablet Take 1 tablet by mouth daily.        . nitroGLYCERIN (NITROSTAT) 0.4 MG SL tablet Place 1 tablet (0.4 mg total) under the tongue every 5 (five) minutes x 3 doses as needed for chest pain.  25 tablet  3  . omeprazole (PRILOSEC) 20 MG capsule Take 20 mg by mouth 2 (two) times daily before a meal.      . pravastatin (PRAVACHOL) 40 MG tablet Take 40 mg by mouth at bedtime.       . Rivaroxaban (XARELTO) 20 MG TABS tablet Take 1 tablet (20 mg total) by mouth daily with supper.  30 tablet  3   No current facility-administered medications for this visit.    Allergies  Allergen Reactions  . Clinoril [Sulindac]     Yeast infection/itching  . Dilaudid [Hydromorphone Hcl]     Changed respirations  . Keflex [Cephalexin]     Yeast infection/itching  . Sulfa Antibiotics     Yeast infection/itching  . Tramadol     itching    Patient Active Problem List   Diagnosis Date Noted  . New onset atrial fibrillation 12/03/2012  . LBBB (left bundle branch block) 12/03/2012  . Type 2 diabetes mellitus 12/03/2012  . Morbid obesity 12/03/2012  . Hypercalcemia 12/03/2012  . Elevated LFTs 12/03/2012  . OSA on CPAP  12/03/2012  . Hypercholesterolemia 12/03/2012  . Normocytic anemia 12/03/2012  . Cough 01/17/2012    History  Smoking status  . Never Smoker   Smokeless tobacco  . Never Used    History  Alcohol Use No    Family History  Problem Relation Age of Onset  . Brain cancer Father   . Heart disease Brother     Born with unknown heart defect    Review of Systems: Constitutional: no fever chills diaphoresis or fatigue or change in weight.  Head and neck: no hearing loss, no epistaxis, no photophobia or visual disturbance. Respiratory: No cough, shortness of breath or wheezing. Cardiovascular: No chest pain peripheral edema, palpitations. Gastrointestinal: No abdominal distention, no abdominal pain, no change in bowel habits hematochezia or melena. Genitourinary: No dysuria, no frequency, no urgency, no  nocturia. Musculoskeletal:No arthralgias, no back pain, no gait disturbance or myalgias. Neurological: No dizziness, no headaches, no numbness, no seizures, no syncope, no weakness, no tremors. Hematologic: No lymphadenopathy, no easy bruising. Psychiatric: No confusion, no hallucinations, no sleep disturbance.    Physical Exam: Filed Vitals:   12/15/12 1504  BP: 152/86  Pulse: 76   the general appearance reveals a well-developed obese woman in no distress.The head and neck exam reveals pupils equal and reactive.  Extraocular movements are full.  There is no scleral icterus.  The mouth and pharynx are normal.  The neck is supple.  The carotids reveal no bruits.  The jugular venous pressure is normal.  The  thyroid is not enlarged.  There is no lymphadenopathy.  The chest is clear to percussion and auscultation.  There are no rales or rhonchi.  Expansion of the chest is symmetrical.  The precordium is quiet.  The first heart sound is normal.  The second heart sound is physiologically split.  There is no murmur gallop rub or click.  There is no abnormal lift or heave.  The abdomen is soft and nontender.  The bowel sounds are normal.  The liver and spleen are not enlarged.  There are no abdominal masses.  There are no abdominal bruits.  Extremities reveal good pedal pulses.  There is no phlebitis or edema.  There is no cyanosis or clubbing.  Strength is normal and symmetrical in all extremities.  There is no lateralizing weakness.  There are no sensory deficits.  The skin is warm and dry.  There is no rash.  EKG today shows normal sinus rhythm and no ischemic changes.  Left bundle-branch block is absent.   Assessment / Plan: Continue same medication.  Return soon for a 2 day Lexa scan Myoview stress test. Continue heart healthy diet with attempts to lose weight. Recheck for followup in 2 months or when necessary

## 2012-12-15 NOTE — Patient Instructions (Addendum)
Your physician recommends that you continue on your current medications as directed. Please refer to the Current Medication list given to you today.  Your physician recommends that you schedule a follow-up appointment in: 2 months

## 2012-12-15 NOTE — Assessment & Plan Note (Signed)
She has had no further episodes of atrial fibrillation.  EKG today shows normal sinus rhythm

## 2012-12-15 NOTE — Assessment & Plan Note (Signed)
Patient has a history of hypercholesterolemia and is on pravastatin.  She is not having any myalgias.

## 2012-12-18 DIAGNOSIS — R319 Hematuria, unspecified: Secondary | ICD-10-CM | POA: Diagnosis not present

## 2013-01-08 ENCOUNTER — Encounter (HOSPITAL_COMMUNITY): Payer: Medicare Other

## 2013-01-09 ENCOUNTER — Ambulatory Visit (HOSPITAL_COMMUNITY): Payer: Medicare Other | Attending: Cardiovascular Disease | Admitting: Radiology

## 2013-01-09 ENCOUNTER — Encounter (INDEPENDENT_AMBULATORY_CARE_PROVIDER_SITE_OTHER): Payer: Self-pay

## 2013-01-09 VITALS — BP 145/80 | HR 65 | Ht 63.5 in | Wt 268.0 lb

## 2013-01-09 DIAGNOSIS — E119 Type 2 diabetes mellitus without complications: Secondary | ICD-10-CM | POA: Insufficient documentation

## 2013-01-09 DIAGNOSIS — R42 Dizziness and giddiness: Secondary | ICD-10-CM | POA: Diagnosis not present

## 2013-01-09 DIAGNOSIS — R079 Chest pain, unspecified: Secondary | ICD-10-CM

## 2013-01-09 DIAGNOSIS — I447 Left bundle-branch block, unspecified: Secondary | ICD-10-CM | POA: Insufficient documentation

## 2013-01-09 DIAGNOSIS — I4891 Unspecified atrial fibrillation: Secondary | ICD-10-CM | POA: Insufficient documentation

## 2013-01-09 DIAGNOSIS — E785 Hyperlipidemia, unspecified: Secondary | ICD-10-CM | POA: Diagnosis not present

## 2013-01-09 DIAGNOSIS — R61 Generalized hyperhidrosis: Secondary | ICD-10-CM | POA: Insufficient documentation

## 2013-01-09 DIAGNOSIS — R0602 Shortness of breath: Secondary | ICD-10-CM | POA: Diagnosis not present

## 2013-01-09 DIAGNOSIS — E669 Obesity, unspecified: Secondary | ICD-10-CM | POA: Diagnosis not present

## 2013-01-09 DIAGNOSIS — R0609 Other forms of dyspnea: Secondary | ICD-10-CM | POA: Diagnosis not present

## 2013-01-09 DIAGNOSIS — R110 Nausea: Secondary | ICD-10-CM | POA: Insufficient documentation

## 2013-01-09 DIAGNOSIS — I10 Essential (primary) hypertension: Secondary | ICD-10-CM | POA: Diagnosis not present

## 2013-01-09 DIAGNOSIS — R5381 Other malaise: Secondary | ICD-10-CM | POA: Diagnosis not present

## 2013-01-09 DIAGNOSIS — R209 Unspecified disturbances of skin sensation: Secondary | ICD-10-CM | POA: Diagnosis not present

## 2013-01-09 DIAGNOSIS — R0989 Other specified symptoms and signs involving the circulatory and respiratory systems: Secondary | ICD-10-CM | POA: Insufficient documentation

## 2013-01-09 MED ORDER — REGADENOSON 0.4 MG/5ML IV SOLN
0.4000 mg | Freq: Once | INTRAVENOUS | Status: AC
  Administered 2013-01-09: 0.4 mg via INTRAVENOUS

## 2013-01-09 MED ORDER — TECHNETIUM TC 99M SESTAMIBI GENERIC - CARDIOLITE
30.0000 | Freq: Once | INTRAVENOUS | Status: AC | PRN
  Administered 2013-01-09: 30 via INTRAVENOUS

## 2013-01-09 NOTE — Progress Notes (Signed)
[facility] [address] [place], [place] [address] [phone]    Cardiology Nuclear Med Study  Kristin [name] is a 57 y.o. female     MRN : [id number]     DOB: 04-13-1955  Procedure Date: 01/09/2013  Nuclear Med Background Indication for Stress Test:  Evaluation for Ischemia History:  No prior known history of CAD; 11/2012 ER-new onset A-fib, LBBB, Echo EF 60-65% Cardiac Risk Factors: Hypertension, Lipids, NIDDM and Obesity  Symptoms:  Diaphoresis, Dizziness, DOE, Fatigue, Fatigue with Exertion, Light-Headedness, Nausea, Rapid HR and SOB   Nuclear Pre-Procedure Caffeine/Decaff Intake:  None NPO After: 8:00pm   Lungs:  clear O2 Sat: 95% on room air. IV 0.9% NS with Angio Cath:  22g  IV Site: R Antecubital  IV Started by:  Crissie Figures, RN  Chest Size (in):  48 Cup Size: C  Height: 5' 3.5" (1.613 m)  Weight:  268 lb (121.564 kg)  BMI:  Body mass index is 46.72 kg/(m^2). Tech Comments:  Patient took Metoprolol this AM    Nuclear Med Study 1 or 2 day study: 2 day  Stress Test Type:  Lexiscan  Reading MD: Kirk Ruths, MD  Order Authorizing Provider:  Darlin Coco, MD  Resting Radionuclide: Technetium 35mSestamibi  Resting Radionuclide Dose: 33.0 mCi on 01/16/13   Stress Radionuclide:  Technetium 98mestamibi  Stress Radionuclide Dose: 33.0 mCi on 01/09/13           Stress Protocol Rest HR: 65 Stress HR: 96  Rest BP: 145/80 Stress BP: 147/70  Exercise Time (min): n/a METS: n/a   Predicted Max HR: 164 bpm % Max HR: 58.54 bpm Rate Pressure Product: 14112   Dose of Adenosine (mg):  n/a Dose of Lexiscan: 0.4 mg  Dose of Atropine (mg): n/a Dose of Dobutamine: n/a mcg/kg/min (at max HR)  Stress Test Technologist: PaIrven BaltimoreRN  Nuclear Technologist:  StCharlton AmorCNMT     Rest Procedure:  Myocardial perfusion imaging was performed at rest 45 minutes following the intravenous administration of Technetium 9937mstamibi. Rest  ECG: Sinus rhythm, PRWP.  Stress Procedure:  The patient received IV Lexiscan 0.4 mg over 15-seconds.  Technetium 75m60mtamibi injected at 30-seconds.  The patient complained of chest pain, throat tightness, bilateral jaw pain, nausea, and headache with Lexiscan that improved quickly in recovery. Quantitative spect images were obtained after a 45 minute delay. Stress ECG: No significant ST segment change suggestive of ischemia.  QPS Raw Data Images:  Acquisition technically good; normal left ventricular size. Stress Images:  Normal homogeneous uptake in all areas of the myocardium. Rest Images:  Normal homogeneous uptake in all areas of the myocardium. Subtraction (SDS):  No evidence of ischemia. Transient Ischemic Dilatation (Normal <1.22):  0.92 Lung/Heart Ratio (Normal <0.45):  0.15  Quantitative Gated Spect Images QGS EDV:  100 ml QGS ESV:  41 ml  Impression Exercise Capacity:  Lexiscan with no exercise. BP Response:  Normal blood pressure response. Clinical Symptoms:  There is throat tightness and dyspnea. ECG Impression:  No significant ST segment change suggestive of ischemia. Comparison with Prior Nuclear Study: No previous nuclear study performed  Overall Impression:  Normal stress nuclear study.  LV Ejection Fraction: 59%.  LV Wall Motion:  NL LV Function; NL Wall Motion   BriaKirk Ruths

## 2013-01-11 DIAGNOSIS — K219 Gastro-esophageal reflux disease without esophagitis: Secondary | ICD-10-CM | POA: Diagnosis not present

## 2013-01-11 DIAGNOSIS — N181 Chronic kidney disease, stage 1: Secondary | ICD-10-CM | POA: Diagnosis not present

## 2013-01-11 DIAGNOSIS — I129 Hypertensive chronic kidney disease with stage 1 through stage 4 chronic kidney disease, or unspecified chronic kidney disease: Secondary | ICD-10-CM | POA: Diagnosis not present

## 2013-01-11 DIAGNOSIS — E1129 Type 2 diabetes mellitus with other diabetic kidney complication: Secondary | ICD-10-CM | POA: Diagnosis not present

## 2013-01-11 DIAGNOSIS — E785 Hyperlipidemia, unspecified: Secondary | ICD-10-CM | POA: Diagnosis not present

## 2013-01-11 DIAGNOSIS — Z23 Encounter for immunization: Secondary | ICD-10-CM | POA: Diagnosis not present

## 2013-01-16 ENCOUNTER — Ambulatory Visit (HOSPITAL_COMMUNITY): Payer: 59 | Attending: Cardiology

## 2013-01-16 DIAGNOSIS — R0989 Other specified symptoms and signs involving the circulatory and respiratory systems: Secondary | ICD-10-CM

## 2013-01-16 DIAGNOSIS — R079 Chest pain, unspecified: Secondary | ICD-10-CM

## 2013-01-16 MED ORDER — TECHNETIUM TC 99M SESTAMIBI GENERIC - CARDIOLITE
33.0000 | Freq: Once | INTRAVENOUS | Status: AC | PRN
  Administered 2013-01-16: 33 via INTRAVENOUS

## 2013-01-17 ENCOUNTER — Telehealth: Payer: Self-pay | Admitting: *Deleted

## 2013-01-17 NOTE — Telephone Encounter (Signed)
Message copied by Earvin Hansen on Wed Jan 17, 2013  4:33 PM ------      Message from: Darlin Coco      Created: Wed Jan 17, 2013  1:36 PM       Please report.  The stress test was normal.  The left ventricular function was normal and there was no evidence of ischemia.  Continue present medication. ------

## 2013-01-17 NOTE — Telephone Encounter (Signed)
Advised patient

## 2013-03-02 ENCOUNTER — Encounter (INDEPENDENT_AMBULATORY_CARE_PROVIDER_SITE_OTHER): Payer: Self-pay

## 2013-03-02 ENCOUNTER — Telehealth: Payer: Self-pay | Admitting: *Deleted

## 2013-03-02 ENCOUNTER — Ambulatory Visit (INDEPENDENT_AMBULATORY_CARE_PROVIDER_SITE_OTHER): Payer: Medicare Other | Admitting: Cardiology

## 2013-03-02 ENCOUNTER — Encounter: Payer: Self-pay | Admitting: Cardiology

## 2013-03-02 VITALS — BP 145/79 | HR 84 | Ht 63.0 in | Wt 267.0 lb

## 2013-03-02 DIAGNOSIS — E7800 Pure hypercholesterolemia, unspecified: Secondary | ICD-10-CM

## 2013-03-02 DIAGNOSIS — I119 Hypertensive heart disease without heart failure: Secondary | ICD-10-CM | POA: Diagnosis not present

## 2013-03-02 DIAGNOSIS — I4891 Unspecified atrial fibrillation: Secondary | ICD-10-CM

## 2013-03-02 DIAGNOSIS — I447 Left bundle-branch block, unspecified: Secondary | ICD-10-CM

## 2013-03-02 DIAGNOSIS — I480 Paroxysmal atrial fibrillation: Secondary | ICD-10-CM

## 2013-03-02 MED ORDER — RIVAROXABAN 20 MG PO TABS: 20.0000 mg | ORAL_TABLET | Freq: Every day | ORAL | Status: DC

## 2013-03-02 NOTE — Progress Notes (Signed)
Kristin [name] Date of Birth:  1955-05-09 [address] [place], [place]  [address] [phone]         Fax   [phone]  History of Present Illness: This pleasant 57 year old woman is seen for a scheduled post hospital followup office visit.  The patient was hospitalized from 12/02/2012 until 12/03/2012.  She presented with new-onset rapid atrial fibrillation.  Her EKG with rapid atrial fibrillation showed left bundle branch block.  There was no prior history of bundle branch block.  She converted in the emergency room back to sinus rhythm prior to starting IV amiodarone.  Her followup EKG with normal sinus rhythm showed resolution of left bundle branch block.  Her chadds-vasc score is 3 and she was discharged on Xarelto.  She has a history of hypertension and diabetes mellitus and morbid obesity and hypercholesterolemia.  She also has a history of obstructive sleep apnea and uses a CPAP machine at home.  Since last visit she has done well.  She had a adenosine Myoview stress test on 01/17/13 which showed no ischemia and her ejection fraction was 59%.  She was under a lot of stress several weeks ago when a very close relative died.  There was a lot of family turmoil after the funeral and the patient felt that her heart was beating and fluttering rapidly.  Since then she has been under less stress and her heart has been doing better.  The patient has been having frequent headaches.  She finds that caffeine help for headache.  She feels that the headaches are a form of migraine.   Current Outpatient Prescriptions  Medication Sig Dispense Refill  . chlorpheniramine (CHLOR-TRIMETON) 4 MG tablet Take 4 mg by mouth 2 (two) times daily.       . Cholecalciferol (VITAMIN D3) 2000 UNITS capsule Take 2,000 Units by mouth 2 (two) times daily.      . clotrimazole-betamethasone (LOTRISONE) cream As needed as directed      . escitalopram (LEXAPRO) 20 MG tablet Take 20 mg by mouth daily.        . Exenatide ER (BYDUREON) 2 MG PEN Inject into the skin. bydureon pen 56m/0.65ml inject sq once weekly      . famotidine (PEPCID) 20 MG tablet Take 20 mg by mouth at bedtime.      .[name] KitchenLORazepam (ATIVAN) 1 MG tablet Take 1 mg by mouth daily.      . metFORMIN (GLUCOPHAGE) 1000 MG tablet Take 1,000 mg by mouth 2 (two) times daily with a meal.      . methylphenidate (RITALIN) 10 MG tablet Take 10 mg by mouth 2 (two) times daily.      . metoprolol succinate (TOPROL-XL) 25 MG 24 hr tablet Take 1/2 tab= (12.546m once a day      . metroNIDAZOLE (METROCREAM) 0.75 % cream As directed      . Multiple Vitamin (MULTIVITAMIN WITH MINERALS) TABS tablet Take 1 tablet by mouth daily.      . nitroGLYCERIN (NITROSTAT) 0.4 MG SL tablet Place 1 tablet (0.4 mg total) under the tongue every 5 (five) minutes x 3 doses as needed for chest pain.  25 tablet  3  . omeprazole (PRILOSEC) 20 MG capsule Take 20 mg by mouth 2 (two) times daily before a meal.      . pravastatin (PRAVACHOL) 40 MG tablet Take 40 mg by mouth at bedtime.       . Rivaroxaban (XARELTO) 20 MG TABS tablet Take 1  tablet (20 mg total) by mouth daily with supper.  90 tablet  3   No current facility-administered medications for this visit.    Allergies  Allergen Reactions  . Clinoril [Sulindac]     Yeast infection/itching  . Dilaudid [Hydromorphone Hcl]     Changed respirations  . Keflex [Cephalexin]     Yeast infection/itching  . Sulfa Antibiotics     Yeast infection/itching  . Tramadol     itching    Patient Active Problem List   Diagnosis Date Noted  . Chest pain at rest 12/15/2012  . New onset atrial fibrillation 12/03/2012  . LBBB (left bundle branch block) 12/03/2012  . Type 2 diabetes mellitus 12/03/2012  . Morbid obesity 12/03/2012  . Hypercalcemia 12/03/2012  . Elevated LFTs 12/03/2012  . OSA on CPAP 12/03/2012  . Hypercholesterolemia 12/03/2012  . Normocytic anemia 12/03/2012  . Cough 01/17/2012    History  Smoking status   . Never Smoker   Smokeless tobacco  . Never Used    History  Alcohol Use No    Family History  Problem Relation Age of Onset  . Brain cancer Father   . Heart disease Brother     Born with unknown heart defect    Review of Systems: Constitutional: no fever chills diaphoresis or fatigue or change in weight.  Head and neck: no hearing loss, no epistaxis, no photophobia or visual disturbance. Respiratory: No cough, shortness of breath or wheezing. Cardiovascular: No chest pain peripheral edema, palpitations. Gastrointestinal: No abdominal distention, no abdominal pain, no change in bowel habits hematochezia or melena. Genitourinary: No dysuria, no frequency, no urgency, no nocturia. Musculoskeletal:No arthralgias, no back pain, no gait disturbance or myalgias. Neurological: No dizziness, no headaches, no numbness, no seizures, no syncope, no weakness, no tremors. Hematologic: No lymphadenopathy, no easy bruising. Psychiatric: No confusion, no hallucinations, no sleep disturbance.    Physical Exam: Filed Vitals:   03/02/13 1541  BP: 145/79  Pulse: 84   the general appearance reveals a well-developed obese woman in no distress.The head and neck exam reveals pupils equal and reactive.  Extraocular movements are full.  There is no scleral icterus.  The mouth and pharynx are normal.  The neck is supple.  The carotids reveal no bruits.  The jugular venous pressure is normal.  The  thyroid is not enlarged.  There is no lymphadenopathy.  The chest is clear to percussion and auscultation.  There are no rales or rhonchi.  Expansion of the chest is symmetrical.  The precordium is quiet.  The first heart sound is normal.  The second heart sound is physiologically split.  There is no murmur gallop rub or click.  There is no abnormal lift or heave.  The abdomen is soft and nontender.  The bowel sounds are normal.  The liver and spleen are not enlarged.  There are no abdominal masses.  There are no  abdominal bruits.  Extremities reveal good pedal pulses.  There is no phlebitis or edema.  There is no cyanosis or clubbing.  Strength is normal and symmetrical in all extremities.  There is no lateralizing weakness.  There are no sensory deficits.  The skin is warm and dry.  There is no rash.     Assessment / Plan: Continue same medication.   Continue heart healthy diet with attempts to lose weight. Recheck in 3 months for office visit and EKG.

## 2013-03-02 NOTE — Telephone Encounter (Signed)
Advised patient

## 2013-03-02 NOTE — Telephone Encounter (Signed)
Patient seen for ov today and needs to stop Aspirin secondary to being on Xarelto.

## 2013-03-02 NOTE — Assessment & Plan Note (Addendum)
The patient has hypercholesterolemia.  She is on Pravachol.  She's not having any myalgias.  Her lipids are followed by her PCP Dr. Harlan Stains

## 2013-03-02 NOTE — Assessment & Plan Note (Signed)
The patient is making a good effort to lose weight.  Her weight is down 5 pounds since last visit.

## 2013-03-02 NOTE — Patient Instructions (Signed)
Your physician recommends that you continue on your current medications as directed. Please refer to the Current Medication list given to you today.  Your physician recommends that you schedule a follow-up appointment in: 3 month ov/ekg

## 2013-03-02 NOTE — Assessment & Plan Note (Signed)
The patient is maintaining normal sinus rhythm today.  We did not do an EKG.  Her rhythm is regular on bedside evaluation.  She is on Xarelto which we refilled today.  She is tolerating Xarelto without side effects.  She has not had any TIA or stroke symptoms.  No evidence of GI bleeding.

## 2013-03-13 DIAGNOSIS — I4891 Unspecified atrial fibrillation: Secondary | ICD-10-CM | POA: Diagnosis not present

## 2013-03-13 DIAGNOSIS — I10 Essential (primary) hypertension: Secondary | ICD-10-CM | POA: Diagnosis not present

## 2013-03-13 DIAGNOSIS — E119 Type 2 diabetes mellitus without complications: Secondary | ICD-10-CM | POA: Diagnosis not present

## 2013-03-13 DIAGNOSIS — R51 Headache: Secondary | ICD-10-CM | POA: Diagnosis not present

## 2013-04-16 DIAGNOSIS — I10 Essential (primary) hypertension: Secondary | ICD-10-CM | POA: Diagnosis not present

## 2013-04-16 DIAGNOSIS — F329 Major depressive disorder, single episode, unspecified: Secondary | ICD-10-CM | POA: Diagnosis not present

## 2013-05-21 DIAGNOSIS — F329 Major depressive disorder, single episode, unspecified: Secondary | ICD-10-CM | POA: Diagnosis not present

## 2013-05-21 DIAGNOSIS — R51 Headache: Secondary | ICD-10-CM | POA: Diagnosis not present

## 2013-05-21 DIAGNOSIS — I10 Essential (primary) hypertension: Secondary | ICD-10-CM | POA: Diagnosis not present

## 2013-06-04 ENCOUNTER — Ambulatory Visit: Payer: Medicare Other | Admitting: Cardiology

## 2013-06-12 ENCOUNTER — Encounter: Payer: Self-pay | Admitting: Cardiology

## 2013-06-12 ENCOUNTER — Ambulatory Visit (INDEPENDENT_AMBULATORY_CARE_PROVIDER_SITE_OTHER): Payer: Medicare Other | Admitting: Cardiology

## 2013-06-12 VITALS — BP 120/74 | HR 85 | Ht 63.0 in | Wt 266.0 lb

## 2013-06-12 DIAGNOSIS — I447 Left bundle-branch block, unspecified: Secondary | ICD-10-CM

## 2013-06-12 DIAGNOSIS — R079 Chest pain, unspecified: Secondary | ICD-10-CM

## 2013-06-12 DIAGNOSIS — I4891 Unspecified atrial fibrillation: Secondary | ICD-10-CM

## 2013-06-12 DIAGNOSIS — I480 Paroxysmal atrial fibrillation: Secondary | ICD-10-CM

## 2013-06-12 DIAGNOSIS — I119 Hypertensive heart disease without heart failure: Secondary | ICD-10-CM | POA: Diagnosis not present

## 2013-06-12 NOTE — Assessment & Plan Note (Signed)
The patient has not been having any recurrence of her chest pain

## 2013-06-12 NOTE — Assessment & Plan Note (Signed)
The patient has had no recurrence of her atrial fibrillation.  She remains on Xarelto.  She has had occasional intermittent hematuria

## 2013-06-12 NOTE — Patient Instructions (Signed)
Your physician recommends that you continue on your current medications as directed. Please refer to the Current Medication list given to you today.  Your physician wants you to follow-up in: 4 month ov You will receive a reminder letter in the mail two months in advance. If you don't receive a letter, please call our office to schedule the follow-up appointment.

## 2013-06-12 NOTE — Assessment & Plan Note (Signed)
The patient is discouraged about her inability to lose weight.  She is considering gastric bypass surgery.

## 2013-06-12 NOTE — Progress Notes (Signed)
Kristin [name] Date of Birth:  12-Jan-1956 [address] [place], [place]  [address] [phone]         Fax   [phone]  History of Present Illness: This pleasant 58 year old woman is seen for a scheduled post hospital followup office visit.  The patient was hospitalized from 12/02/2012 until 12/03/2012.  She presented with new-onset rapid atrial fibrillation.  Her EKG with rapid atrial fibrillation showed left bundle branch block.  There was no prior history of bundle branch block.  She converted in the emergency room back to sinus rhythm prior to starting IV amiodarone.  Her followup EKG with normal sinus rhythm showed resolution of left bundle branch block.  Her chadds-vasc score is 3 and she was discharged on Xarelto.  She has a history of hypertension and diabetes mellitus and morbid obesity and hypercholesterolemia.  She also has a history of obstructive sleep apnea and uses a CPAP machine at home.  Since last visit she has done well.  She had a adenosine Myoview stress test on 01/17/13 which showed no ischemia and her ejection fraction was 59%.  Since we last saw her her PCP has had to increase 2 of her blood pressure medications in order to get satisfactory blood pressure control.  Current Outpatient Prescriptions  Medication Sig Dispense Refill  . chlorpheniramine (CHLOR-TRIMETON) 4 MG tablet Take 4 mg by mouth 2 (two) times daily.       . Cholecalciferol (VITAMIN D3) 2000 UNITS capsule Take 2,000 Units by mouth 2 (two) times daily.      . clotrimazole-betamethasone (LOTRISONE) cream As needed as directed      . escitalopram (LEXAPRO) 20 MG tablet Take 20 mg by mouth daily.      . Exenatide ER (BYDUREON) 2 MG PEN Inject into the skin. bydureon pen 19m/0.65ml inject sq once weekly      . famotidine (PEPCID) 20 MG tablet Take 20 mg by mouth at bedtime.      .[name] KitchenLORazepam (ATIVAN) 1 MG tablet Take 1 mg by mouth daily.      . metFORMIN (GLUCOPHAGE) 1000 MG tablet Take  1,000 mg by mouth 2 (two) times daily with a meal.      . methylphenidate (RITALIN) 10 MG tablet Take 10 mg by mouth 2 (two) times daily.      . metoprolol succinate (TOPROL-XL) 100 MG 24 hr tablet Take 100 mg by mouth daily. Take with or immediately following a meal.      . metroNIDAZOLE (METROCREAM) 0.75 % cream As directed      . Multiple Vitamin (MULTIVITAMIN WITH MINERALS) TABS tablet Take 1 tablet by mouth daily.      . nitroGLYCERIN (NITROSTAT) 0.4 MG SL tablet Place 1 tablet (0.4 mg total) under the tongue every 5 (five) minutes x 3 doses as needed for chest pain.  25 tablet  3  . omeprazole (PRILOSEC) 20 MG capsule Take 20 mg by mouth 2 (two) times daily before a meal.      . pravastatin (PRAVACHOL) 40 MG tablet Take 40 mg by mouth at bedtime.       . Rivaroxaban (XARELTO) 20 MG TABS tablet Take 1 tablet (20 mg total) by mouth daily with supper.  90 tablet  3  . valsartan (DIOVAN) 320 MG tablet Take 320 mg by mouth daily.       No current facility-administered medications for this visit.    Allergies  Allergen Reactions  . Clinoril [Sulindac]  Yeast infection/itching  . Dilaudid [Hydromorphone Hcl]     Changed respirations  . Keflex [Cephalexin]     Yeast infection/itching  . Sulfa Antibiotics     Yeast infection/itching  . Tramadol     itching    Patient Active Problem List   Diagnosis Date Noted  . Chest pain at rest 12/15/2012  . New onset atrial fibrillation 12/03/2012  . LBBB (left bundle branch block) 12/03/2012  . Type 2 diabetes mellitus 12/03/2012  . Morbid obesity 12/03/2012  . Hypercalcemia 12/03/2012  . Elevated LFTs 12/03/2012  . OSA on CPAP 12/03/2012  . Hypercholesterolemia 12/03/2012  . Normocytic anemia 12/03/2012  . Cough 01/17/2012    History  Smoking status  . Never Smoker   Smokeless tobacco  . Never Used    History  Alcohol Use No    Family History  Problem Relation Age of Onset  . Brain cancer Father   . Heart disease Brother      Born with unknown heart defect    Review of Systems: Constitutional: no fever chills diaphoresis or fatigue or change in weight.  Head and neck: no hearing loss, no epistaxis, no photophobia or visual disturbance. Respiratory: No cough, shortness of breath or wheezing. Cardiovascular: No chest pain peripheral edema, palpitations. Gastrointestinal: No abdominal distention, no abdominal pain, no change in bowel habits hematochezia or melena. Genitourinary: No dysuria, no frequency, no urgency, no nocturia. Musculoskeletal:No arthralgias, no back pain, no gait disturbance or myalgias. Neurological: No dizziness, no headaches, no numbness, no seizures, no syncope, no weakness, no tremors. Hematologic: No lymphadenopathy, no easy bruising. Psychiatric: No confusion, no hallucinations, no sleep disturbance.    Physical Exam: Filed Vitals:   06/12/13 1655  BP: 120/74  Pulse: 85   the general appearance reveals a well-developed obese woman in no distress.The head and neck exam reveals pupils equal and reactive.  Extraocular movements are full.  There is no scleral icterus.  The mouth and pharynx are normal.  The neck is supple.  The carotids reveal no bruits.  The jugular venous pressure is normal.  The  thyroid is not enlarged.  There is no lymphadenopathy.  The chest is clear to percussion and auscultation.  There are no rales or rhonchi.  Expansion of the chest is symmetrical.  The precordium is quiet.  The first heart sound is normal.  The second heart sound is physiologically split.  There is no murmur gallop rub or click.  There is no abnormal lift or heave.  The abdomen is soft and nontender.  The bowel sounds are normal.  The liver and spleen are not enlarged.  There are no abdominal masses.  There are no abdominal bruits.  Extremities reveal good pedal pulses.  There is no phlebitis or edema.  There is no cyanosis or clubbing.  Strength is normal and symmetrical in all extremities.  There  is no lateralizing weakness.  There are no sensory deficits.  The skin is warm and dry.  There is no rash.   EKG today sho normal sinus rhythm and is within normal limits is unchanged since prior tracing.   Assessment / Plan: Continue same medication.   Continue heart healthy diet with attempts to lose weight. Recheck in to 4 months for followup office visit

## 2013-06-12 NOTE — Assessment & Plan Note (Signed)
EKG today again shows resolution of the previous left bundle-branch block

## 2013-07-12 DIAGNOSIS — F329 Major depressive disorder, single episode, unspecified: Secondary | ICD-10-CM | POA: Diagnosis not present

## 2013-07-12 DIAGNOSIS — N181 Chronic kidney disease, stage 1: Secondary | ICD-10-CM | POA: Diagnosis not present

## 2013-07-12 DIAGNOSIS — R319 Hematuria, unspecified: Secondary | ICD-10-CM | POA: Diagnosis not present

## 2013-07-12 DIAGNOSIS — E785 Hyperlipidemia, unspecified: Secondary | ICD-10-CM | POA: Diagnosis not present

## 2013-07-12 DIAGNOSIS — D649 Anemia, unspecified: Secondary | ICD-10-CM | POA: Diagnosis not present

## 2013-07-12 DIAGNOSIS — I129 Hypertensive chronic kidney disease with stage 1 through stage 4 chronic kidney disease, or unspecified chronic kidney disease: Secondary | ICD-10-CM | POA: Diagnosis not present

## 2013-07-12 DIAGNOSIS — H579 Unspecified disorder of eye and adnexa: Secondary | ICD-10-CM | POA: Diagnosis not present

## 2013-07-12 DIAGNOSIS — E1129 Type 2 diabetes mellitus with other diabetic kidney complication: Secondary | ICD-10-CM | POA: Diagnosis not present

## 2013-08-08 DIAGNOSIS — R310 Gross hematuria: Secondary | ICD-10-CM | POA: Diagnosis not present

## 2013-08-15 DIAGNOSIS — N200 Calculus of kidney: Secondary | ICD-10-CM | POA: Diagnosis not present

## 2013-08-15 DIAGNOSIS — R310 Gross hematuria: Secondary | ICD-10-CM | POA: Diagnosis not present

## 2013-08-16 ENCOUNTER — Telehealth: Payer: Self-pay | Admitting: Cardiology

## 2013-08-16 DIAGNOSIS — R310 Gross hematuria: Secondary | ICD-10-CM | POA: Diagnosis not present

## 2013-08-16 DIAGNOSIS — N83209 Unspecified ovarian cyst, unspecified side: Secondary | ICD-10-CM | POA: Diagnosis not present

## 2013-08-16 DIAGNOSIS — N200 Calculus of kidney: Secondary | ICD-10-CM | POA: Diagnosis not present

## 2013-08-16 NOTE — Telephone Encounter (Signed)
New message    Need to hold xarelto  72  hours prior , then 48 after procedure - due to lipahotrpsy .

## 2013-08-16 NOTE — Telephone Encounter (Signed)
Reviewed with Dr. Rayann Heman and his recommendation is to hold Xarelto 48 hours prior and resume 24 hours after if hemostasis is achieved. I left message on Pam's identified voicemail that I would fax Dr. Jackalyn Lombard response to her and to call our office if additional questions.

## 2013-08-16 NOTE — Telephone Encounter (Signed)
Spoke with Pam at [facility] and lithotripsy is planned for 5/28.  Will review with DOD as response is needed prior to Dr. Sherryl Barters return to office

## 2013-08-22 ENCOUNTER — Other Ambulatory Visit: Payer: Self-pay | Admitting: Urology

## 2013-08-23 ENCOUNTER — Encounter (HOSPITAL_COMMUNITY): Payer: Self-pay | Admitting: General Practice

## 2013-08-23 DIAGNOSIS — R310 Gross hematuria: Secondary | ICD-10-CM | POA: Diagnosis not present

## 2013-08-27 ENCOUNTER — Encounter (HOSPITAL_COMMUNITY): Payer: Self-pay | Admitting: *Deleted

## 2013-08-27 ENCOUNTER — Ambulatory Visit (HOSPITAL_COMMUNITY): Payer: Medicare Other

## 2013-08-27 ENCOUNTER — Observation Stay (HOSPITAL_COMMUNITY)
Admission: RE | Admit: 2013-08-27 | Discharge: 2013-08-28 | Disposition: A | Discharge status: Home / Self Care | Payer: Medicare Other | Source: Ambulatory Visit | Attending: Internal Medicine | Admitting: Internal Medicine

## 2013-08-27 ENCOUNTER — Encounter (HOSPITAL_COMMUNITY)
Admission: RE | Disposition: A | Discharge status: Home / Self Care | Payer: Self-pay | Source: Ambulatory Visit | Attending: Internal Medicine

## 2013-08-27 DIAGNOSIS — F329 Major depressive disorder, single episode, unspecified: Secondary | ICD-10-CM | POA: Diagnosis not present

## 2013-08-27 DIAGNOSIS — I498 Other specified cardiac arrhythmias: Secondary | ICD-10-CM | POA: Diagnosis not present

## 2013-08-27 DIAGNOSIS — R319 Hematuria, unspecified: Secondary | ICD-10-CM | POA: Diagnosis not present

## 2013-08-27 DIAGNOSIS — I959 Hypotension, unspecified: Secondary | ICD-10-CM | POA: Diagnosis not present

## 2013-08-27 DIAGNOSIS — E7800 Pure hypercholesterolemia, unspecified: Secondary | ICD-10-CM | POA: Insufficient documentation

## 2013-08-27 DIAGNOSIS — K219 Gastro-esophageal reflux disease without esophagitis: Secondary | ICD-10-CM | POA: Diagnosis not present

## 2013-08-27 DIAGNOSIS — R001 Bradycardia, unspecified: Secondary | ICD-10-CM | POA: Diagnosis present

## 2013-08-27 DIAGNOSIS — E1149 Type 2 diabetes mellitus with other diabetic neurological complication: Secondary | ICD-10-CM | POA: Diagnosis not present

## 2013-08-27 DIAGNOSIS — F411 Generalized anxiety disorder: Secondary | ICD-10-CM | POA: Diagnosis not present

## 2013-08-27 DIAGNOSIS — R945 Abnormal results of liver function studies: Secondary | ICD-10-CM

## 2013-08-27 DIAGNOSIS — I10 Essential (primary) hypertension: Secondary | ICD-10-CM | POA: Insufficient documentation

## 2013-08-27 DIAGNOSIS — F3289 Other specified depressive episodes: Secondary | ICD-10-CM | POA: Diagnosis not present

## 2013-08-27 DIAGNOSIS — I4891 Unspecified atrial fibrillation: Secondary | ICD-10-CM

## 2013-08-27 DIAGNOSIS — R7989 Other specified abnormal findings of blood chemistry: Secondary | ICD-10-CM

## 2013-08-27 DIAGNOSIS — E119 Type 2 diabetes mellitus without complications: Secondary | ICD-10-CM

## 2013-08-27 DIAGNOSIS — R079 Chest pain, unspecified: Secondary | ICD-10-CM

## 2013-08-27 DIAGNOSIS — N83209 Unspecified ovarian cyst, unspecified side: Secondary | ICD-10-CM | POA: Insufficient documentation

## 2013-08-27 DIAGNOSIS — R059 Cough, unspecified: Secondary | ICD-10-CM

## 2013-08-27 DIAGNOSIS — Z79899 Other long term (current) drug therapy: Secondary | ICD-10-CM | POA: Insufficient documentation

## 2013-08-27 DIAGNOSIS — Z9989 Dependence on other enabling machines and devices: Secondary | ICD-10-CM

## 2013-08-27 DIAGNOSIS — I480 Paroxysmal atrial fibrillation: Secondary | ICD-10-CM | POA: Diagnosis present

## 2013-08-27 DIAGNOSIS — Z87440 Personal history of urinary (tract) infections: Secondary | ICD-10-CM | POA: Diagnosis not present

## 2013-08-27 DIAGNOSIS — E1142 Type 2 diabetes mellitus with diabetic polyneuropathy: Secondary | ICD-10-CM | POA: Insufficient documentation

## 2013-08-27 DIAGNOSIS — E860 Dehydration: Secondary | ICD-10-CM | POA: Diagnosis not present

## 2013-08-27 DIAGNOSIS — N200 Calculus of kidney: Secondary | ICD-10-CM | POA: Diagnosis not present

## 2013-08-27 DIAGNOSIS — R05 Cough: Secondary | ICD-10-CM

## 2013-08-27 DIAGNOSIS — G4733 Obstructive sleep apnea (adult) (pediatric): Secondary | ICD-10-CM | POA: Diagnosis present

## 2013-08-27 LAB — BASIC METABOLIC PANEL
BUN: 14 mg/dL (ref 6–23)
CO2: 27 mEq/L (ref 19–32)
Calcium: 8.9 mg/dL (ref 8.4–10.5)
Chloride: 102 mEq/L (ref 96–112)
Creatinine, Ser: 0.88 mg/dL (ref 0.50–1.10)
GFR calc Af Amer: 84 mL/min — ABNORMAL LOW (ref >90)
GFR calc non Af Amer: 72 mL/min — ABNORMAL LOW (ref >90)
Glucose, Bld: 153 mg/dL — ABNORMAL HIGH (ref 70–99)
Potassium: 4.1 mEq/L (ref 3.7–5.3)
Sodium: 139 mEq/L (ref 137–147)

## 2013-08-27 LAB — CBC
HCT: 34.3 % — ABNORMAL LOW (ref 36.0–46.0)
Hemoglobin: 10.8 g/dL — ABNORMAL LOW (ref 12.0–15.0)
MCH: 23.8 pg — ABNORMAL LOW (ref 26.0–34.0)
MCHC: 31.5 g/dL (ref 30.0–36.0)
MCV: 75.7 fL — ABNORMAL LOW (ref 78.0–100.0)
Platelets: 332 10*3/uL (ref 150–400)
RBC: 4.53 MIL/uL (ref 3.87–5.11)
RDW: 16.7 % — ABNORMAL HIGH (ref 11.5–15.5)
WBC: 8.7 10*3/uL (ref 4.0–10.5)

## 2013-08-27 LAB — GLUCOSE, CAPILLARY
Glucose-Capillary: 117 mg/dL — ABNORMAL HIGH (ref 70–99)
Glucose-Capillary: 125 mg/dL — ABNORMAL HIGH (ref 70–99)

## 2013-08-27 SURGERY — LITHOTRIPSY, ESWL
Anesthesia: LOCAL | Laterality: Right

## 2013-08-27 MED ORDER — PANTOPRAZOLE SODIUM 40 MG PO TBEC
40.0000 mg | DELAYED_RELEASE_TABLET | Freq: Every day | ORAL | Status: DC
  Administered 2013-08-28: 40 mg via ORAL
  Filled 2013-08-27: qty 1

## 2013-08-27 MED ORDER — SENNA 8.6 MG PO TABS
1.0000 | ORAL_TABLET | Freq: Two times a day (BID) | ORAL | Status: DC
  Administered 2013-08-28 (×2): 8.6 mg via ORAL
  Filled 2013-08-27 (×2): qty 1

## 2013-08-27 MED ORDER — SODIUM CHLORIDE 0.9 % IJ SOLN: 3.0000 mL | INTRAMUSCULAR | Status: DC | PRN

## 2013-08-27 MED ORDER — SIMVASTATIN 10 MG PO TABS
10.0000 mg | ORAL_TABLET | Freq: Every day | ORAL | Status: DC
  Filled 2013-08-27: qty 1

## 2013-08-27 MED ORDER — SODIUM CHLORIDE 0.9 % IV SOLN
INTRAVENOUS | Status: DC
  Administered 2013-08-28: via INTRAVENOUS

## 2013-08-27 MED ORDER — LORAZEPAM 1 MG PO TABS: 1.0000 mg | ORAL_TABLET | Freq: Every day | ORAL | Status: DC | PRN

## 2013-08-27 MED ORDER — DOCUSATE SODIUM 100 MG PO CAPS
100.0000 mg | ORAL_CAPSULE | Freq: Two times a day (BID) | ORAL | Status: DC
  Administered 2013-08-28 (×2): 100 mg via ORAL
  Filled 2013-08-27 (×3): qty 1

## 2013-08-27 MED ORDER — ACETAMINOPHEN 325 MG PO TABS: 650.0000 mg | ORAL_TABLET | ORAL | Status: DC | PRN

## 2013-08-27 MED ORDER — SODIUM CHLORIDE 0.9 % IV SOLN: 250.0000 mL | INTRAVENOUS | Status: DC | PRN

## 2013-08-27 MED ORDER — ACETAMINOPHEN 325 MG PO TABS: 650.0000 mg | ORAL_TABLET | Freq: Four times a day (QID) | ORAL | Status: DC | PRN

## 2013-08-27 MED ORDER — CLOTRIMAZOLE 1 % EX CREA
TOPICAL_CREAM | Freq: Two times a day (BID) | CUTANEOUS | Status: DC
  Administered 2013-08-28: via TOPICAL
  Filled 2013-08-27: qty 15

## 2013-08-27 MED ORDER — ESCITALOPRAM OXALATE 20 MG PO TABS
20.0000 mg | ORAL_TABLET | Freq: Every day | ORAL | Status: DC
  Administered 2013-08-28: 20 mg via ORAL
  Filled 2013-08-27: qty 1

## 2013-08-27 MED ORDER — EXENATIDE 10 MCG/0.04ML ~~LOC~~ SOPN: 10.0000 ug | PEN_INJECTOR | Freq: Two times a day (BID) | SUBCUTANEOUS | Status: DC

## 2013-08-27 MED ORDER — CIPROFLOXACIN HCL 500 MG PO TABS
500.0000 mg | ORAL_TABLET | ORAL | Status: AC
  Administered 2013-08-27: 500 mg via ORAL
  Filled 2013-08-27: qty 1

## 2013-08-27 MED ORDER — ONDANSETRON HCL 4 MG PO TABS: 4.0000 mg | ORAL_TABLET | Freq: Four times a day (QID) | ORAL | Status: DC | PRN

## 2013-08-27 MED ORDER — VITAMIN B-1 100 MG PO TABS
100.0000 mg | ORAL_TABLET | Freq: Every day | ORAL | Status: DC
  Administered 2013-08-28: 100 mg via ORAL
  Filled 2013-08-27: qty 1

## 2013-08-27 MED ORDER — MAGNESIUM CITRATE PO SOLN: 1.0000 | Freq: Once | ORAL | Status: AC | PRN

## 2013-08-27 MED ORDER — SODIUM CHLORIDE 0.9 % IV SOLN
INTRAVENOUS | Status: DC
  Administered 2013-08-27: 22:00:00 via INTRAVENOUS

## 2013-08-27 MED ORDER — BISACODYL 10 MG RE SUPP: 10.0000 mg | Freq: Every day | RECTAL | Status: DC | PRN

## 2013-08-27 MED ORDER — ADULT MULTIVITAMIN W/MINERALS CH
1.0000 | ORAL_TABLET | Freq: Every day | ORAL | Status: DC
  Administered 2013-08-28: 1 via ORAL
  Filled 2013-08-27: qty 1

## 2013-08-27 MED ORDER — MORPHINE SULFATE 2 MG/ML IJ SOLN: 2.0000 mg | INTRAMUSCULAR | Status: DC | PRN

## 2013-08-27 MED ORDER — INSULIN ASPART 100 UNIT/ML ~~LOC~~ SOLN: 0.0000 [IU] | Freq: Three times a day (TID) | SUBCUTANEOUS | Status: DC

## 2013-08-27 MED ORDER — DIAZEPAM 5 MG PO TABS
10.0000 mg | ORAL_TABLET | ORAL | Status: AC
  Administered 2013-08-27: 10 mg via ORAL
  Filled 2013-08-27: qty 2

## 2013-08-27 MED ORDER — VITAMIN D 1000 UNITS PO TABS
2000.0000 [IU] | ORAL_TABLET | Freq: Two times a day (BID) | ORAL | Status: DC
  Administered 2013-08-28 (×2): 2000 [IU] via ORAL
  Filled 2013-08-27 (×3): qty 2

## 2013-08-27 MED ORDER — FOLIC ACID 1 MG PO TABS
1.0000 mg | ORAL_TABLET | Freq: Every day | ORAL | Status: DC
  Administered 2013-08-28: 1 mg via ORAL
  Filled 2013-08-27: qty 1

## 2013-08-27 MED ORDER — FENTANYL CITRATE 0.05 MG/ML IJ SOLN: 25.0000 ug | INTRAMUSCULAR | Status: DC | PRN

## 2013-08-27 MED ORDER — OXYCODONE HCL 5 MG PO TABS: 5.0000 mg | ORAL_TABLET | ORAL | Status: DC | PRN

## 2013-08-27 MED ORDER — FAMOTIDINE 20 MG PO TABS
20.0000 mg | ORAL_TABLET | Freq: Every day | ORAL | Status: DC
  Administered 2013-08-28: 20 mg via ORAL
  Filled 2013-08-27 (×2): qty 1

## 2013-08-27 MED ORDER — ACETAMINOPHEN 650 MG RE SUPP
650.0000 mg | RECTAL | Status: DC | PRN
  Filled 2013-08-27: qty 1

## 2013-08-27 MED ORDER — DEXTROSE-NACL 5-0.45 % IV SOLN
INTRAVENOUS | Status: DC
  Administered 2013-08-27: 17:00:00 via INTRAVENOUS

## 2013-08-27 MED ORDER — NITROGLYCERIN 0.4 MG SL SUBL: 0.4000 mg | SUBLINGUAL_TABLET | SUBLINGUAL | Status: DC | PRN

## 2013-08-27 MED ORDER — METHYLPHENIDATE HCL 10 MG PO TABS: 10.0000 mg | ORAL_TABLET | Freq: Two times a day (BID) | ORAL | Status: DC | PRN

## 2013-08-27 MED ORDER — ONDANSETRON HCL 4 MG/2ML IJ SOLN: 4.0000 mg | Freq: Four times a day (QID) | INTRAMUSCULAR | Status: DC | PRN

## 2013-08-27 MED ORDER — ALUM & MAG HYDROXIDE-SIMETH 200-200-20 MG/5ML PO SUSP: 30.0000 mL | Freq: Four times a day (QID) | ORAL | Status: DC | PRN

## 2013-08-27 MED ORDER — METFORMIN HCL 500 MG PO TABS
1000.0000 mg | ORAL_TABLET | Freq: Two times a day (BID) | ORAL | Status: DC
  Administered 2013-08-28: 1000 mg via ORAL
  Filled 2013-08-27 (×3): qty 2

## 2013-08-27 MED ORDER — SODIUM CHLORIDE 0.9 % IJ SOLN
3.0000 mL | Freq: Two times a day (BID) | INTRAMUSCULAR | Status: DC
  Administered 2013-08-28: 3 mL via INTRAVENOUS

## 2013-08-27 MED ORDER — DIPHENHYDRAMINE HCL 25 MG PO CAPS
25.0000 mg | ORAL_CAPSULE | ORAL | Status: AC
  Administered 2013-08-27: 25 mg via ORAL
  Filled 2013-08-27: qty 1

## 2013-08-27 MED ORDER — OXYCODONE-ACETAMINOPHEN 5-325 MG PO TABS: 1.0000 | ORAL_TABLET | ORAL | Status: DC | PRN

## 2013-08-27 MED ORDER — ACETAMINOPHEN 650 MG RE SUPP: 650.0000 mg | Freq: Four times a day (QID) | RECTAL | Status: DC | PRN

## 2013-08-27 MED ORDER — RIVAROXABAN 20 MG PO TABS: 20.0000 mg | ORAL_TABLET | Freq: Every day | ORAL | Status: DC
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
## 2013-08-27 NOTE — H&P (Signed)
oblems Problems   1. Cysts of both ovaries (620.2)  2. Detrusor instability (596.59)  3. Gross hematuria (599.71)  4. Kidney stone on left side (592.0)  5. Kidney stone on right side (592.0)  6. Urge and stress incontinence (788.33)  History of Present Illness     Kristin [name] returns today with her CT results for possible cystoscopy.  She continues to have daily gross hematuria.   The CT shows a right mid renal stone and a left lower pole stone.  Neither or obstructive.   She has bilateral low density ovarian lesions that are probably cysts.   Her culture was postive but the hematuria has persisted on Cipro.   Past Medical History Problems   1. History of Anxiety (300.00)  2. History of Arthritis (V13.4)  3. History of Diabetic peripheral neuropathy (250.60,357.2)  4. History of anemia (V12.3)  5. History of atrial fibrillation (V12.59)  6. History of depression (V11.8)  7. History of diabetes mellitus (V12.29)  8. History of esophageal reflux (V12.79)  9. History of hypercholesterolemia (V12.29)  10. History of hypertension (V12.59)  11. History of kidney disease (V13.09)  12. History of sleep apnea (V13.89)  13. History of urinary tract infection (V13.02)  Surgical History Problems   1. History of Ankle Repair  2. History of Cholecystectomy  3. History of Colostomy  4. History of Colostomy Revision  5. History of Knee Arthroscopy  6. History of Knee Surgery Right  7. History of Leg Incision  (Below Knee)  8. History of Leg Repair  9. History of Oral Surgery Tooth Extraction  10. History of Rectovag Fistula Repair - Abd Appr With Concomit Colostomy  11. History of Rectovaginal Fistula Repair - Vaginal Approach  12. History of Tonsillectomy  Current Meds  1. BuPROPion HCl ER (XL) 150 MG Oral Tablet Extended Release 24 Hour;  Therapy: 16Apr2015 to Recorded  2. Byetta 10 MCG Pen SOLN;  Therapy: (Recorded:19May2011) to Recorded  3. Ciprofloxacin HCl - 500 MG Oral Tablet;  TAKE 1 TABLET BID;  Therapy: [id number] to ([place])  Requested for: [id number]; Last  Rx:18May2015 Ordered  4. Excedrin Tension Headache TABS;  Therapy: (Recorded:19May2011) to Recorded  5. Irbesartan 300 MG Oral Tablet;  Therapy: 16Apr2015 to Recorded  6. Lexapro 20 MG Oral Tablet;  Therapy: (Recorded:19May2011) to Recorded  7. LORazepam 1 MG Oral Tablet;  Therapy: (Recorded:19May2011) to Recorded  8. MetFORMIN HCl ER 500 MG Oral Tablet Extended Release 24 Hour;  Therapy: (Recorded:19May2011) to Recorded  9. Metoprolol Succinate ER 100 MG Oral Tablet Extended Release 24 Hour;  Therapy: [id number] to Recorded  10. Metoprolol Succinate ER 25 MG Oral Tablet Extended Release 24 Hour;   Therapy: 07Sep2014 to Recorded  11. Metoprolol Succinate ER 50 MG Oral Tablet Extended Release 24 Hour;   Therapy: [id number] to Recorded  12. Pravastatin Sodium 40 MG Oral Tablet;   Therapy: (Recorded:19May2011) to Recorded  13. PriLOSEC 20 MG Oral Capsule Delayed Release;   Therapy: (Recorded:19May2011) to Recorded  14. Ritalin 10 MG Oral Tablet; as needed for focus;   Therapy: (Recorded:21May2015) to Recorded  15. Xarelto 20 MG Oral Tablet;   Therapy: [id number] to Recorded  Allergies Medication   1. Dilaudid TABS  2. tramadol  3. Clinoril TABS  4. Keflex CAPS  5. Sulfa Drugs  Family History Problems   1. Family history of Death In The Family Father   died age 75-Brain tumor  2. Family history of diabetes mellitus (V18.0) : Mother  3. Family history  of kidney stones (V18.69) : Mother, Father  4. Family history of malignant neoplasm of brain (V16.8) : Father  5. Family history of Hematuria  6. Family history of Nephrolithiasis : Brother  7. Family history of Polymyalgia : Mother  Social History Problems   1. Caffeine Use   2-3 per day  2. Marital History - Single  3. Never a smoker  4. Occupation:   retired  18. Social alcohol use  6. Denied: History of Tobacco  Use  Review of Systems  Gastrointestinal: no nausea and no flank pain.  Constitutional: no fever.    Vitals Vital Signs [Data Includes: Last 1 Day]  Recorded: [id number] 08:22AM  Blood Pressure: 128 / 73 Temperature: 98.5 F Heart Rate: 71  Physical Exam Constitutional: Well nourished and well developed . No acute distress.  Pulmonary: No respiratory distress and normal respiratory rhythm and effort.  Cardiovascular: Heart rate and rhythm are normal . No peripheral edema.  Genitourinary:   Examination of the external genitalia shows normal female external genitalia. The urethra is normal in appearance (but proximal). Urethral hypermobility is not present. Vaginal exam demonstrates atrophy (with stenosis) and the vaginal epithelium to be poorly estrogenized. The bladder is normal on palpation, non tender and not distended. The anus is normal on inspection.    Results/Data Urine [Data Includes: Last 1 Day]   [id number]  COLOR RED   APPEARANCE CLOUDY   SPECIFIC GRAVITY >1.030   pH 5.0   GLUCOSE NEG mg/dL  BILIRUBIN SMALL   KETONE NEG mg/dL  BLOOD LARGE   PROTEIN 30 mg/dL  UROBILINOGEN 0.2 mg/dL  NITRITE NEG   LEUKOCYTE ESTERASE NEG   SQUAMOUS EPITHELIAL/HPF RARE   WBC 0-2 WBC/hpf  RBC TNTC RBC/hpf  BACTERIA FEW   CRYSTALS NONE SEEN   CASTS NONE SEEN   Selected Results  AU CT-HEMATURIA PROTOCOL [id number] 12:00AM Irine Seal   Test Name Result Flag Reference  AU CT-HEMATURIA PROTOCOL (Report)    ** RADIOLOGY REPORT BY  RADIOLOGY, [facility] **   CLINICAL DATA: Hematuria.  EXAM: CT ABDOMEN AND PELVIS WITHOUT AND WITH CONTRAST  TECHNIQUE: Multidetector CT imaging of the abdomen and pelvis was performed following the standard protocol before and following the bolus administration of intravenous contrast.  CONTRAST: 125 cc Isovue 300.  COMPARISON: None.  FINDINGS: Lung bases show no acute findings. Heart is mildly enlarged. No pericardial or pleural  effusion.  Liver is unremarkable. Cholecystectomy. Adrenal glands are unremarkable.  Stones are seen in the kidneys bilaterally, measuring up to 11 mm on the left. No hydronephrosis. A 1.4 cm exophytic low-attenuation lesion off the lower pole left kidney does not show enhancement, consistent with a cyst. No filling defects in the renal collecting systems. Bladder is unremarkable.  Spleen, pancreas, stomach and small bowel are unremarkable. There is a hernia through the left rectus abdominus muscle with herniation of unobstructed colon. Colon is otherwise unremarkable. Uterus is visualized. There are low-attenuation lesions associated with the ovaries bilaterally, measuring 5.1 cm on the right and 5.2 cm on the left. No pathologically enlarged lymph nodes. Scattered atherosclerotic calcification of the arterial vasculature without abdominal aortic aneurysm. No free fluid.  Presumed postoperative change in the proximal right femur. No worrisome lytic or sclerotic lesions.  IMPRESSION: 1. Bilateral renal stones. No additional findings to explain the patient's hematuria. 2. Ventral abdominal wall hernia, through the left rectus abdominus muscle, containing unobstructed colon. 3. Low-attenuation lesions associated with the ovaries bilaterally. Given lesion size, and especially if postmenopausal,  consider baseline pelvic ultrasound in further evaluation.   Electronically Signed  By: Lorin Picket M.D.  On: 08/15/2013 16:46   URINE CULTURE [id number] 04:09PM Irine Seal  SOURCE : CLEAN CATCH SPECIMEN TYPE: URINE   Test Name Result Flag Reference  CULTURE, URINE Culture, Urine    ===== COLONY COUNT: =====  >=100,000 COLONIES/ML   FINAL REPORT: KLEBSIELLA PNEUMONIAE   SENSITIVITY FOR: KLEBSIELLA PNEUMONIAE    AMPICILLIN                             RESISTANT               AMOX/CLAVULANIC                        SENSITIVE        <=2    AMPICILLIN/SUL                          SENSITIVE        <=2    PIPERACILLIN/TAZO                      SENSITIVE          8    IMIPENEM                               SENSITIVE     <=0.25    CEFAZOLIN                              SENSITIVE        <=4    CEFTRIAXONE                            SENSITIVE        <=1    CEFTAZIDIME                            SENSITIVE        <=1    CEFEPIME                               SENSITIVE        <=1    GENTAMICIN                             SENSITIVE        <=1    TOBRAMYCIN                             SENSITIVE        <=1    CIPROFLOXACIN                          SENSITIVE     <=0.25    LEVOFLOXACIN                           SENSITIVE     <=0.12    NITROFURANTOIN  RESISTANT        128    TRIMETH/SULFA                          SENSITIVE       <=20    END OF REPORT   Procedure  Procedure: Cystoscopy   Indication: Hematuria.  Informed Consent: Risks, benefits, and potential adverse events were discussed and informed consent was obtained from the patient.  Prep: The patient was prepped with betadine.  Procedure Note:  Urethral meatus:. No abnormalities.  Anterior urethra: No abnormalities.  Bladder: Visulization was clear. The right ureteral orifice was in the normal anatomic position and had bloody efflux. The left ureteral orifice was in the normal anatomic position and had clear efflux of urine. A systematic survey of the bladder demonstrated no bladder tumors or stones. The mucosa was smooth without abnormalities. The patient tolerated the procedure well.  Complications: None.    Assessment Assessed   1. Gross hematuria (599.71)  2. Cysts of both ovaries (620.2)  3. Kidney stone on left side (592.0)  4. Kidney stone on right side (592.0)       She has bilateral non-obstructing renal stones with right renal bleeding on Xarelto. She has probable bilateral ovarian cysts. She had a UTI on her visit on 5/13 but is now on Cipro per culture.   Plan Cysts of both  ovaries   1. Gynecology Referral Referral  Referral  Status: Hold For - Appointment,Records   Requested for: [id number]  2. Follow-up Schedule Surgery Office  Follow-up  Status: Hold For - Appointment   Requested for: [id number] Gross hematuria   3. Cysto; Status:Hold For - Appointment,Date of Service; Requested for:21May2015;   4. Pelvic Exam; Status:Hold For - Manual Activation; Requested for:21May2015;   5. URINE CYTOLOGY; Status:In Progress - Specimen/Data Collected;   Done: [id number] Health Maintenance   6. UA With REFLEX; [Do Not Release]; Status:Resulted - Requires Verification;   Done:  [id number] 08:11AM Kidney stone on right side   7. KUB; Status:Canceled - Appointment,Date of Service;    I have discussed ESWL vs ureteroscopy for her right renal stone which appears to be the cause of the bleeding.  I will get a cytology and if that is negative, I will set her up for ESWL.  I have reviewed the risks of bleeding, infection, injury to the kidney or adjacent structures, need for secondary procedures, thrombotic events, death and anesthetic complications.   She will need clearance from Dr. Mare Ferrari to be off of the Xarelto per routine preop and postop for at least 2 days or until risk of bleeding is no longer present.   She will need to see her Gyn about the cysts so I will refer her back to Dr. Ulanda Edison.   Discussion/Summary  CC: Dr. Harlan Stains, Dr Warren Danes and Dr. Alphonzo Dublin.

## 2013-08-27 NOTE — Progress Notes (Signed)
Report called to Tvedt RN.  Questions asked and answered.  Pt transferred to step down ICU via wheelchair.

## 2013-08-27 NOTE — Progress Notes (Signed)
Pt states she is feeling better and needs to void.  Pt sitting up without complaints.  BP 98/59 Hr 54 and regular.  Pt ambulated to bathroom with standby assist.  Pt voided moderate amount of dark tea colored urine. Pt ambulated back to room without difficulty. Pt sitting up in recliner eating and drinking without difficulty.

## 2013-08-27 NOTE — Progress Notes (Signed)
Rt flank area red, skin intact.

## 2013-08-27 NOTE — Progress Notes (Signed)
Dr. Junious Silk notified of pt's status.  New orders given.  Will continue to monitor pt.

## 2013-08-27 NOTE — Discharge Instructions (Signed)
Lithotripsy, Care After Refer to this sheet in the next few weeks. These instructions provide you with information on caring for yourself after your procedure. Your health care provider may also give you more specific instructions. Your treatment has been planned according to current medical practices, but problems sometimes occur. Call your health care provider if you have any problems or questions after your procedure. WHAT TO EXPECT AFTER THE PROCEDURE   Your urine may have a red tinge for a few days after treatment. Blood loss is usually minimal.  You may have soreness in the back or flank area. This usually goes away after a few days. The procedure can cause blotches or bruises on the back where the pressure wave enters the skin. These marks usually cause only minimal discomfort and should disappear in a short time.  Stone fragments should begin to pass within 24 hours of treatment. However, a delayed passage is not unusual.  You may have pain, discomfort, and feel sick to your stomach (nauseated) when the crushed fragments of stone are passed down the tube from the kidney to the bladder. Stone fragments can pass soon after the procedure and may last for up to 4 8 weeks.  A small number of patients may have severe pain when stone fragments are not able to pass, which leads to an obstruction.  If your stone is greater than 1 inch (2.5 cm) in diameter or if you have multiple stones that have a combined diameter greater than 1 inch (2.5 cm), you may require more than one treatment.  If you had a stent placed prior to your procedure, you may experience some discomfort, especially during urination. You may experience the pain or discomfort in your flank or back, or you may experience a sharp pain or discomfort at the base of your penis or in your lower abdomen. The discomfort usually lasts only a few minutes after urinating. HOME CARE INSTRUCTIONS   Rest at home until you feel your energy  improving.  Only take over-the-counter or prescription medicines for pain, discomfort, or fever as directed by your health care provider. Depending on the type of lithotripsy, you may need to take antibiotics and anti-inflammatory medicines for a few days.  Drink enough water and fluids to keep your urine clear or pale yellow. This helps "flush" your kidneys. It helps pass any remaining pieces of stone and prevents stones from coming back.  Most people can resume daily activities within 1 2 days after standard lithotripsy. It can take longer to recover from laser and percutaneous lithotripsy.  If the stones are in your urinary system, you may be asked to strain your urine at home to look for stones. Any stones that are found can be sent to a medical lab for examination.  Visit your health care provider for a follow-up appointment in a few weeks. Your doctor may remove your stent if you have one. Your health care provider will also check to see whether stone particles still remain. SEEK MEDICAL CARE IF:   Your pain is not relieved by medicine.  You have a lasting nauseous feeling.  You feel there is too much blood in the urine.  You develop persistent problems with frequent or painful urination that does not at least partially improve after 2 days following the procedure.  You have a congested cough.  You feel lightheaded.  You develop a rash or any other signs that might suggest an allergic problem.  You develop any reaction or side  effects to your medicine(s). SEEK IMMEDIATE MEDICAL CARE IF:   You experience severe back or flank pain or both.  You see nothing but blood when you urinate.  You cannot pass any urine at all.  You have a fever or shaking chills.  You develop shortness of breath, difficulty breathing, or chest pain.  You develop vomiting that will not stop after 6 8 hours.  You have a fainting episode. Document Released: 04/04/2007 Document Revised: 01/03/2013  Document Reviewed: 09/28/2012 [facility] Patient Information 2014 [place], [place].   Hold Xarelto until after you are seen in f/u in 2 days.

## 2013-08-27 NOTE — Progress Notes (Signed)
Pt states she is not feeling well and is becoming nauseated again.  She does appear to be pale.  BP 56/44, HR 47, Pt is A&0 x 4.  Pt is placed with feet up and lying back in recliner.  Continue to monitor pt.  Will notify oncall doctor.

## 2013-08-27 NOTE — Progress Notes (Signed)
Patient ID: Kristin [name], female   DOB: 10-31-1955, 58 y.o.   MRN: [id number]  Patient without complaints.  Physical exam: Sitting in chair, getting EKG No acute distress  H&H stable, bmet stable  Impression postoperative bradycardia and hypotension - doubt sepsis, afebrile, wbc normal.  Doubt hematoma - H&H stable. Bmet with normal renal fxn. Appreciate excellent care from Hospitalist, swift evaluation and admission. I will notify Dr. Jeffie Pollock of admission.

## 2013-08-27 NOTE — Progress Notes (Signed)
Dr Humphrey Rolls, hospitalist in to see pt.

## 2013-08-27 NOTE — Progress Notes (Signed)
Pt returned via wheelchair with c/o of nausea.  Pt diaphoretic and pale.  Manual pulse 46 and regular.  Pt in recliner lying back with feet elevated. Pt awake and talking to me and her mom. A&Ox4.   Pt has no complaints. IVF's infusing at 250/hr. Will continue to monitor.  Encouraging patient to take slow deep breaths.  Pt verbalizes understanding.

## 2013-08-28 DIAGNOSIS — N200 Calculus of kidney: Secondary | ICD-10-CM

## 2013-08-28 DIAGNOSIS — G4733 Obstructive sleep apnea (adult) (pediatric): Secondary | ICD-10-CM

## 2013-08-28 DIAGNOSIS — R05 Cough: Secondary | ICD-10-CM

## 2013-08-28 DIAGNOSIS — R059 Cough, unspecified: Secondary | ICD-10-CM | POA: Diagnosis not present

## 2013-08-28 DIAGNOSIS — R7989 Other specified abnormal findings of blood chemistry: Secondary | ICD-10-CM

## 2013-08-28 DIAGNOSIS — R079 Chest pain, unspecified: Secondary | ICD-10-CM

## 2013-08-28 DIAGNOSIS — I498 Other specified cardiac arrhythmias: Secondary | ICD-10-CM | POA: Diagnosis not present

## 2013-08-28 DIAGNOSIS — E119 Type 2 diabetes mellitus without complications: Secondary | ICD-10-CM

## 2013-08-28 DIAGNOSIS — I4891 Unspecified atrial fibrillation: Secondary | ICD-10-CM

## 2013-08-28 LAB — COMPREHENSIVE METABOLIC PANEL
ALT: 28 U/L (ref 0–35)
AST: 23 U/L (ref 0–37)
Albumin: 3 g/dL — ABNORMAL LOW (ref 3.5–5.2)
Alkaline Phosphatase: 72 U/L (ref 39–117)
BUN: 14 mg/dL (ref 6–23)
CO2: 27 mEq/L (ref 19–32)
Calcium: 8.8 mg/dL (ref 8.4–10.5)
Chloride: 102 mEq/L (ref 96–112)
Creatinine, Ser: 0.9 mg/dL (ref 0.50–1.10)
GFR calc Af Amer: 81 mL/min — ABNORMAL LOW (ref >90)
GFR calc non Af Amer: 70 mL/min — ABNORMAL LOW (ref >90)
Glucose, Bld: 189 mg/dL — ABNORMAL HIGH (ref 70–99)
Potassium: 4.2 mEq/L (ref 3.7–5.3)
Sodium: 138 mEq/L (ref 137–147)
Total Bilirubin: 0.5 mg/dL (ref 0.3–1.2)
Total Protein: 5.8 g/dL — ABNORMAL LOW (ref 6.0–8.3)

## 2013-08-28 LAB — PROTIME-INR
INR: 1.11 (ref 0.00–1.49)
Prothrombin Time: 14.1 seconds (ref 11.6–15.2)

## 2013-08-28 LAB — CBC
HCT: 33.8 % — ABNORMAL LOW (ref 36.0–46.0)
Hemoglobin: 10.2 g/dL — ABNORMAL LOW (ref 12.0–15.0)
MCH: 23 pg — ABNORMAL LOW (ref 26.0–34.0)
MCHC: 30.2 g/dL (ref 30.0–36.0)
MCV: 76.3 fL — ABNORMAL LOW (ref 78.0–100.0)
Platelets: 310 10*3/uL (ref 150–400)
RBC: 4.43 MIL/uL (ref 3.87–5.11)
RDW: 16.5 % — ABNORMAL HIGH (ref 11.5–15.5)
WBC: 9.3 10*3/uL (ref 4.0–10.5)

## 2013-08-28 LAB — TSH: TSH: 0.613 u[IU]/mL (ref 0.350–4.500)

## 2013-08-28 LAB — HEMOGLOBIN A1C
Hgb A1c MFr Bld: 6.7 % — ABNORMAL HIGH (ref <5.7)
Mean Plasma Glucose: 146 mg/dL — ABNORMAL HIGH (ref <117)

## 2013-08-28 LAB — GLUCOSE, CAPILLARY: Glucose-Capillary: 106 mg/dL — ABNORMAL HIGH (ref 70–99)

## 2013-08-28 LAB — MRSA PCR SCREENING: MRSA by PCR: NEGATIVE

## 2013-08-28 MED ORDER — OXYCODONE-ACETAMINOPHEN 5-325 MG PO TABS
1.0000 | ORAL_TABLET | ORAL | Status: DC | PRN
  Administered 2013-08-28: 1 via ORAL
  Filled 2013-08-28: qty 1

## 2013-08-28 NOTE — Progress Notes (Signed)
Reviewed discharge instructions with patient, and patient signed copy for chart.  Patient dressed and sent home with instructions, belongings, and personnel items.  To follow up with her doctor tomorrow.  Laurence Crofford Roselie Awkward RN

## 2013-08-28 NOTE — Progress Notes (Signed)
Patient ID: Kristin [name], female   DOB: 04-08-1955, 59 y.o.   MRN: [id number] 1 Day Post-Op  Subjective: Kristin [name] required admission post ESWL for bradycardia and hypotension with dehydration.   She is better this morning and is normotensive and has NSR.   Her Hgb was 10.8 last night and 10.2 this morning but this is probably dilutional as she has no significant flank pain and only minimal hematuria.  She has passed a couple of fragments.  ROS:  Review of Systems  Constitutional: Negative for fever.  Gastrointestinal: Negative for nausea.    Anti-infectives: Anti-infectives   Start     Dose/Rate Route Frequency Ordered Stop   08/27/13 1537  ciprofloxacin (CIPRO) tablet 500 mg     500 mg Oral 60 min pre-op 08/27/13 1537 08/27/13 1639      Current Facility-Administered Medications  Medication Dose Route Frequency Provider Last Rate Last Dose  . 0.9 %  sodium chloride infusion   Intravenous Continuous Allyne Gee, MD 125 mL/hr at 08/28/13 0014    . acetaminophen (TYLENOL) tablet 650 mg  650 mg Oral Q6H PRN Allyne Gee, MD       Or  . acetaminophen (TYLENOL) suppository 650 mg  650 mg Rectal Q6H PRN Allyne Gee, MD      . alum & mag hydroxide-simeth (MAALOX/MYLANTA) 200-200-20 MG/5ML suspension 30 mL  30 mL Oral Q6H PRN Allyne Gee, MD      . bisacodyl (DULCOLAX) suppository 10 mg  10 mg Rectal Daily PRN Allyne Gee, MD      . cholecalciferol (VITAMIN D) tablet 2,000 Units  2,000 Units Oral BID Allyne Gee, MD   2,000 Units at 08/28/13 0013  . clotrimazole (LOTRIMIN) 1 % cream   Topical BID Allyne Gee, MD      . docusate sodium (COLACE) capsule 100 mg  100 mg Oral BID Allyne Gee, MD   100 mg at 08/28/13 0013  . escitalopram (LEXAPRO) tablet 20 mg  20 mg Oral Daily Allyne Gee, MD      . exenatide (BYETTA) injection SOPN 10 mcg  10 mcg Subcutaneous BID WC Allyne Gee, MD      . famotidine (PEPCID) tablet 20 mg  20 mg Oral QHS Allyne Gee, MD   20 mg at 08/28/13 0013   . folic acid (FOLVITE) tablet 1 mg  1 mg Oral Daily Allyne Gee, MD      . insulin aspart (novoLOG) injection 0-20 Units  0-20 Units Subcutaneous TID WC Allyne Gee, MD      . LORazepam (ATIVAN) tablet 1 mg  1 mg Oral Daily PRN Allyne Gee, MD      . metFORMIN (GLUCOPHAGE) tablet 1,000 mg  1,000 mg Oral BID WC Allyne Gee, MD      . morphine 2 MG/ML injection 2 mg  2 mg Intravenous Q4H PRN Allyne Gee, MD      . multivitamin with minerals tablet 1 tablet  1 tablet Oral Daily Allyne Gee, MD      . nitroGLYCERIN (NITROSTAT) SL tablet 0.4 mg  0.4 mg Sublingual Q5 Min x 3 PRN Allyne Gee, MD      . ondansetron [facility]) tablet 4 mg  4 mg Oral Q6H PRN Allyne Gee, MD       Or  . ondansetron (ZOFRAN) injection 4 mg  4 mg Intravenous Q6H PRN Allyne Gee, MD      .  oxyCODONE-acetaminophen (PERCOCET/ROXICET) 5-325 MG per tablet 1 tablet  1 tablet Oral Q4H PRN Jeryl Columbia, NP   1 tablet at 08/28/13 0548  . pantoprazole (PROTONIX) EC tablet 40 mg  40 mg Oral Daily Allyne Gee, MD      . Derrill Memo ON 08/29/2013] rivaroxaban (XARELTO) tablet 20 mg  20 mg Oral Q supper Allyne Gee, MD      . senna (SENOKOT) tablet 8.6 mg  1 tablet Oral BID Allyne Gee, MD   8.6 mg at 08/28/13 0013  . simvastatin (ZOCOR) tablet 10 mg  10 mg Oral q1800 Allyne Gee, MD      . thiamine (VITAMIN B-1) tablet 100 mg  100 mg Oral Daily Allyne Gee, MD         Objective: Vital signs in last 24 hours: Temp:  [98 F (36.7 C)-98.4 F (36.9 C)] 98.1 F (36.7 C) (06/02 0400) Pulse Rate:  [47-79] 72 (06/02 0431) Resp:  [18-25] 19 (06/02 0431) BP: (55-132)/(34-80) 126/53 mmHg (06/02 0431) SpO2:  [87 %-99 %] 98 % (06/02 0431) Weight:  [120.657 kg (266 lb)-123 kg (271 lb 2.7 oz)] 123 kg (271 lb 2.7 oz) (06/02 0400)  Intake/Output from previous day: 06/01 0701 - 06/02 0700 In: 1805.8 [P.O.:360; I.V.:1445.8] Out: 575 [Urine:575] Intake/Output this shift:     Physical Exam  Constitutional: She is  well-developed, well-nourished, and in no distress. No distress.  Abdominal: Soft. She exhibits no mass. There is no tenderness.    Lab Results:   Recent Labs  08/27/13 2103 08/28/13 0337  WBC 8.7 9.3  HGB 10.8* 10.2*  HCT 34.3* 33.8*  PLT 332 310   BMET  Recent Labs  08/27/13 2103 08/28/13 0337  NA 139 138  K 4.1 4.2  CL 102 102  CO2 27 27  GLUCOSE 153* 189*  BUN 14 14  CREATININE 0.88 0.90  CALCIUM 8.9 8.8   PT/INR  Recent Labs  08/28/13 0337  LABPROT 14.1  INR 1.11   ABG No results found for this basename: PHART, PCO2, PO2, HCO3,  in the last 72 hours  Studies/Results: Dg Abd 1 View  08/27/2013   CLINICAL DATA:  Preop for right-sided lithotripsy. Right-sided flank pain.  EXAM: ABDOMEN - 1 VIEW  COMPARISON:  CT 08/15/2013  FINDINGS: 9 mm lower pole left renal collecting system calculus. Inter/upper pole right renal 9 mm calculus.  Non-obstructive bowel gas pattern. Bilateral pelvic calcifications which are likely phleboliths. Soft tissue calcifications project over the right iliac.  IMPRESSION: Bilateral renal calculi.   Electronically Signed   By: Abigail Miyamoto M.D.   On: 08/27/2013 15:59     Assessment: s/p Procedure(s): RIGHT EXTRACORPOREAL SHOCK WAVE LITHOTRIPSY (ESWL)  Dehydration with post op hypotension and bradycardia. She has no pain or excess hematuria to suggest a hematoma or bleed.   Plan: She is to see me in the office tomorrow for a KUB and renal US to r/o hematoma prior to starting Xareltol     LOS: 1 day    Malka So 08/28/2013

## 2013-08-28 NOTE — Progress Notes (Signed)
RT helped with setting up patients home CPAP. Home setting is 12 cmH2O. Patient does not use humidity until winter time. Patient is tolerating well. RT will continue to monitor as needed.

## 2013-08-28 NOTE — Discharge Summary (Signed)
Physician Discharge Summary  Kristin [name] BDZ:[id number] DOB: Jun 27, 1955 DOA: 08/27/2013  PCP: Vidal Schwalbe, MD  Admit date: 08/27/2013 Discharge date: 08/28/2013  Time spent: 40 minutes  Recommendations for Outpatient Follow-up:  1. Followup with primary care physician in one to 2 weeks.  Discharge Diagnoses:  Principal Problem:   Right nephrolithiasis Active Problems:   Type 2 diabetes mellitus   OSA on CPAP   Left nephrolithiasis   Bradycardia   Atrial fibrillation   Discharge Condition: Stable  Diet recommendation: Carb modified/heart healthy diet.  Filed Weights   08/23/13 1809 08/27/13 1537 08/28/13 0400  Weight: 120.203 kg (265 lb) 120.657 kg (266 lb) 123 kg (271 lb 2.7 oz)    History of present illness:  Kristin [name] is a 58 y.o. female with prior history of OSA DM TypeII presented to the hospital for a lithotripsy. The procedure has gone well without any issues however she subsequently developed bradycardia in recovery. Patient was also noted to be hypotensive. Initially she was asymptomatic. She then started to have nausea and became a little clammy and pale appearing. At the time she was given fluids and had a good response to the fluids with an improvement in her blood pressure and also her symptoms. At the time she was seen she was awake and alert in no distress. She denies having any CP. She is not SOB. She states that this is not the first time this has happened to her. Every time she has surgery she has the same symptoms with bradycardia and hypotension  Hospital Course:   1. Bradycardia -It could be secondary to medication as well as anesthesia.  -on further questioning she states that she has these events every time she has surgery  -Patient was placed in the step down overnight for monitoring, blood pressure and heart rate were normal overnight. -TSH is normal. -Patient is on 100 mg of Toprol-XL, that was restarted on discharge. -Patient to followup with  her primary care physician, if she continues to have issues with bradycardia beta blockers should be decreased and/or referred to the cardiologist. 2. Hypotension -Likely related to above  -pressure seems to be responding to fluids  -will check cortisol level  3. Diabetes Mellitus Type II -will continue with home medications  -monitor CBGs  4. OSA -will continue with CPAP  -she is normally on a pressure of 10CWP  5. Atrial fibrillation -Heart rate is controlled, telemetry reviewed.   Procedures:  None, had ESWL prior to admission  Consultations:  Urology  Discharge Exam: Filed Vitals:   08/28/13 0431  BP: 126/53  Pulse: 72  Temp:   Resp: 19   General: Alert and awake, oriented x3, not in any acute distress. HEENT: anicteric sclera, pupils reactive to light and accommodation, EOMI CVS: S1-S2 clear, no murmur rubs or gallops Chest: clear to auscultation bilaterally, no wheezing, rales or rhonchi Abdomen: soft nontender, nondistended, normal bowel sounds, no organomegaly Extremities: no cyanosis, clubbing or edema noted bilaterally Neuro: Cranial nerves II-XII intact, no focal neurological deficits  Discharge Instructions You were cared for by a hospitalist during your hospital stay. If you have any questions about your discharge medications or the care you received while you were in the hospital after you are discharged, you can call the unit and asked to speak with the hospitalist on call if the hospitalist that took care of you is not available. Once you are discharged, your primary care physician will handle any further medical issues. Please note  that NO REFILLS for any discharge medications will be authorized once you are discharged, as it is imperative that you return to your primary care physician (or establish a relationship with a primary care physician if you do not have one) for your aftercare needs so that they can reassess your need for medications and monitor your  lab values.  Discharge Instructions   Diet - low sodium heart healthy    Complete by:  As directed      Diet Carb Modified    Complete by:  As directed      Increase activity slowly    Complete by:  As directed             Medication List         chlorpheniramine 4 MG tablet  Commonly known as:  CHLOR-TRIMETON  Take 4 mg by mouth 2 (two) times daily.     clotrimazole-betamethasone cream  Commonly known as:  LOTRISONE  As needed as directed     escitalopram 20 MG tablet  Commonly known as:  LEXAPRO  Take 20 mg by mouth daily.     exenatide 10 MCG/0.04ML Sopn injection  Commonly known as:  BYETTA  Inject 10 mcg into the skin 2 (two) times daily with a meal.     famotidine 20 MG tablet  Commonly known as:  PEPCID  Take 20 mg by mouth at bedtime.     irbesartan 300 MG tablet  Commonly known as:  AVAPRO  Take 300 mg by mouth daily.     LORazepam 1 MG tablet  Commonly known as:  ATIVAN  Take 1 mg by mouth daily as needed for anxiety.     metFORMIN 1000 MG tablet  Commonly known as:  GLUCOPHAGE  Take 1,000 mg by mouth 2 (two) times daily with a meal.     methylphenidate 10 MG tablet  Commonly known as:  RITALIN  Take 10 mg by mouth 2 (two) times daily as needed (narcoleptic episodes when driving long distances.).     metoprolol succinate 100 MG 24 hr tablet  Commonly known as:  TOPROL-XL  Take 100 mg by mouth daily. Take with or immediately following a meal.     metroNIDAZOLE 0.75 % cream  Commonly known as:  METROCREAM  As directed     nitroGLYCERIN 0.4 MG SL tablet  Commonly known as:  NITROSTAT  Place 1 tablet (0.4 mg total) under the tongue every 5 (five) minutes x 3 doses as needed for chest pain.     omeprazole 20 MG capsule  Commonly known as:  PRILOSEC  Take 20 mg by mouth 2 (two) times daily before a meal.     oxyCODONE-acetaminophen 5-325 MG per tablet  Commonly known as:  ROXICET  Take 1 tablet by mouth every 4 (four) hours as needed for  severe pain.     pravastatin 40 MG tablet  Commonly known as:  PRAVACHOL  Take 40 mg by mouth at bedtime.     rivaroxaban 20 MG Tabs tablet  Commonly known as:  XARELTO  Take 1 tablet (20 mg total) by mouth daily with supper.     Vitamin D3 2000 UNITS capsule  Take 2,000 Units by mouth 2 (two) times daily.       Allergies  Allergen Reactions  . Clinoril [Sulindac]     Yeast infection/itching  . Dilaudid [Hydromorphone Hcl]     Changed respirations  . Keflex [Cephalexin]     Yeast infection/itching  .  Sulfa Antibiotics     Yeast infection/itching  . Tramadol     itching       Follow-up Information   Follow up with Malka So, MD. (As scheduled.)    Specialty:  Urology   Contact information:   [place] [address] [phone]        The results of significant diagnostics from this hospitalization (including imaging, microbiology, ancillary and laboratory) are listed below for reference.    Significant Diagnostic Studies: Dg Abd 1 View  08/27/2013   CLINICAL DATA:  Preop for right-sided lithotripsy. Right-sided flank pain.  EXAM: ABDOMEN - 1 VIEW  COMPARISON:  CT 08/15/2013  FINDINGS: 9 mm lower pole left renal collecting system calculus. Inter/upper pole right renal 9 mm calculus.  Non-obstructive bowel gas pattern. Bilateral pelvic calcifications which are likely phleboliths. Soft tissue calcifications project over the right iliac.  IMPRESSION: Bilateral renal calculi.   Electronically Signed   By: Abigail Miyamoto M.D.   On: 08/27/2013 15:59    Microbiology: Recent Results (from the past 240 hour(s))  MRSA PCR SCREENING     Status: None   Collection Time    08/27/13 11:09 PM      Result Value Ref Range Status   MRSA by PCR NEGATIVE  NEGATIVE Final   Comment:            The GeneXpert MRSA Assay (FDA     approved for NASAL specimens     only), is one component of a     comprehensive MRSA colonization     surveillance program. It is not      intended to diagnose MRSA     infection nor to guide or     monitor treatment for     MRSA infections.     Labs: Basic Metabolic Panel:  Recent Labs Lab 08/27/13 2103 08/28/13 0337  NA 139 138  K 4.1 4.2  CL 102 102  CO2 27 27  GLUCOSE 153* 189*  BUN 14 14  CREATININE 0.88 0.90  CALCIUM 8.9 8.8   Liver Function Tests:  Recent Labs Lab 08/28/13 0337  AST 23  ALT 28  ALKPHOS 72  BILITOT 0.5  PROT 5.8*  ALBUMIN 3.0*   No results found for this basename: LIPASE, AMYLASE,  in the last 168 hours No results found for this basename: AMMONIA,  in the last 168 hours CBC:  Recent Labs Lab 08/27/13 2103 08/28/13 0337  WBC 8.7 9.3  HGB 10.8* 10.2*  HCT 34.3* 33.8*  MCV 75.7* 76.3*  PLT 332 310   Cardiac Enzymes: No results found for this basename: CKTOTAL, CKMB, CKMBINDEX, TROPONINI,  in the last 168 hours BNP: BNP (last 3 results) No results found for this basename: PROBNP,  in the last 8760 hours CBG:  Recent Labs Lab 08/27/13 1608 08/27/13 2241  GLUCAP 117* 125*       Signed:  Rowin Bayron  [facility] 08/28/2013, 7:17 AM

## 2013-08-28 NOTE — Progress Notes (Signed)
RT called Service response for patients home machine to be checked per hospital protocol

## 2013-08-29 DIAGNOSIS — N200 Calculus of kidney: Secondary | ICD-10-CM | POA: Diagnosis not present

## 2013-08-29 LAB — CORTISOL-AM, BLOOD: Cortisol - AM: 3.1 ug/dL — ABNORMAL LOW (ref 4.3–22.4)

## 2013-09-06 DIAGNOSIS — N83209 Unspecified ovarian cyst, unspecified side: Secondary | ICD-10-CM | POA: Diagnosis not present

## 2013-09-06 DIAGNOSIS — Z01419 Encounter for gynecological examination (general) (routine) without abnormal findings: Secondary | ICD-10-CM | POA: Diagnosis not present

## 2013-10-11 DIAGNOSIS — F329 Major depressive disorder, single episode, unspecified: Secondary | ICD-10-CM | POA: Diagnosis not present

## 2013-10-11 DIAGNOSIS — D649 Anemia, unspecified: Secondary | ICD-10-CM | POA: Diagnosis not present

## 2013-10-11 DIAGNOSIS — R197 Diarrhea, unspecified: Secondary | ICD-10-CM | POA: Diagnosis not present

## 2013-10-23 DIAGNOSIS — N83209 Unspecified ovarian cyst, unspecified side: Secondary | ICD-10-CM | POA: Diagnosis not present

## 2013-10-23 DIAGNOSIS — Z124 Encounter for screening for malignant neoplasm of cervix: Secondary | ICD-10-CM | POA: Diagnosis not present

## 2013-10-25 DIAGNOSIS — N200 Calculus of kidney: Secondary | ICD-10-CM | POA: Diagnosis not present

## 2013-10-30 DIAGNOSIS — D251 Intramural leiomyoma of uterus: Secondary | ICD-10-CM | POA: Diagnosis not present

## 2013-10-30 DIAGNOSIS — N83209 Unspecified ovarian cyst, unspecified side: Secondary | ICD-10-CM | POA: Diagnosis not present

## 2013-11-01 DIAGNOSIS — N200 Calculus of kidney: Secondary | ICD-10-CM | POA: Diagnosis not present

## 2013-11-06 DIAGNOSIS — N83209 Unspecified ovarian cyst, unspecified side: Secondary | ICD-10-CM | POA: Diagnosis not present

## 2013-11-06 DIAGNOSIS — R310 Gross hematuria: Secondary | ICD-10-CM | POA: Diagnosis not present

## 2013-11-06 DIAGNOSIS — N200 Calculus of kidney: Secondary | ICD-10-CM | POA: Diagnosis not present

## 2013-11-28 ENCOUNTER — Ambulatory Visit (INDEPENDENT_AMBULATORY_CARE_PROVIDER_SITE_OTHER): Payer: Medicare Other | Admitting: Cardiology

## 2013-11-28 ENCOUNTER — Encounter: Payer: Self-pay | Admitting: Cardiology

## 2013-11-28 VITALS — BP 143/79 | HR 75 | Ht 63.0 in | Wt 266.0 lb

## 2013-11-28 DIAGNOSIS — I119 Hypertensive heart disease without heart failure: Secondary | ICD-10-CM

## 2013-11-28 DIAGNOSIS — E1149 Type 2 diabetes mellitus with other diabetic neurological complication: Secondary | ICD-10-CM | POA: Diagnosis not present

## 2013-11-28 DIAGNOSIS — I447 Left bundle-branch block, unspecified: Secondary | ICD-10-CM

## 2013-11-28 DIAGNOSIS — I4891 Unspecified atrial fibrillation: Secondary | ICD-10-CM | POA: Diagnosis not present

## 2013-11-28 DIAGNOSIS — I480 Paroxysmal atrial fibrillation: Secondary | ICD-10-CM

## 2013-11-28 DIAGNOSIS — N200 Calculus of kidney: Secondary | ICD-10-CM

## 2013-11-28 DIAGNOSIS — E1140 Type 2 diabetes mellitus with diabetic neuropathy, unspecified: Secondary | ICD-10-CM

## 2013-11-28 DIAGNOSIS — E1142 Type 2 diabetes mellitus with diabetic polyneuropathy: Secondary | ICD-10-CM

## 2013-11-28 NOTE — Assessment & Plan Note (Signed)
She remains on Xarelto for her paroxysmal atrial fibrillation.  She has not been aware of any racing of her heart.  She has not been experiencing any easy bruising or epistaxis.  She has had some hematuria associated with the kidney stones.

## 2013-11-28 NOTE — Progress Notes (Signed)
Kristin [name] Date of Birth:  [birth year] [place] [address] [place], [place]  [address] [phone]        Fax   [phone]   History of Present Illness: This pleasant 58 year old woman is seen for a scheduled followup office visit. The patient was hospitalized from 12/02/2012 until 12/03/2012. She presented with new-onset rapid atrial fibrillation. Her EKG with rapid atrial fibrillation showed left bundle branch block. There was no prior history of bundle branch block. She converted in the emergency room back to sinus rhythm prior to starting IV amiodarone. Her followup EKG with normal sinus rhythm showed resolution of left bundle branch block. Her chadds-vasc score is 3 and she was discharged on Xarelto. She has a history of hypertension and diabetes mellitus and morbid obesity and hypercholesterolemia. She also has a history of obstructive sleep apnea and uses a CPAP machine at home. Since last visit she has done well. She had a adenosine Myoview stress test on 01/17/13 which showed no ischemia and her ejection fraction was 59%.  She has not been experiencing any chest discomfort.  She has had several noncardiac problems recently.  She has been having some kidney stones and hematuria and Dr. Irine Seal is her urologist.  She has been having some pain in the right side of her jaw.  She has been having some numbness of her hands and feet when she is inactive.  The numbness goes away when she moves her hands around.  She recently came back from a cruise to the [place].  While there she had some increased swelling of her hands and feet.  Current Outpatient Prescriptions  Medication Sig Dispense Refill  . chlorpheniramine (CHLOR-TRIMETON) 4 MG tablet Take 4 mg by mouth 2 (two) times daily.       . Cholecalciferol (VITAMIN D3) 2000 UNITS capsule Take 2,000 Units by mouth 2 (two) times daily.      . clotrimazole-betamethasone (LOTRISONE) cream As needed as directed        . escitalopram (LEXAPRO) 20 MG tablet Take 20 mg by mouth daily.      [name] Kitchen exenatide (BYETTA) 10 MCG/0.04ML SOPN injection Inject 10 mcg into the skin 2 (two) times daily with a meal.      . famotidine (PEPCID) 20 MG tablet Take 20 mg by mouth at bedtime.      . irbesartan (AVAPRO) 300 MG tablet Take 300 mg by mouth daily.      [name] Kitchen LORazepam (ATIVAN) 1 MG tablet Take 1 mg by mouth daily as needed for anxiety.       . metFORMIN (GLUCOPHAGE) 1000 MG tablet Take 1,000 mg by mouth 2 (two) times daily with a meal.      . methylphenidate (RITALIN) 10 MG tablet Take 10 mg by mouth 2 (two) times daily as needed (narcoleptic episodes when driving long distances.).       [name] Kitchen metoprolol succinate (TOPROL-XL) 100 MG 24 hr tablet Take 100 mg by mouth daily. Take with or immediately following a meal.      . metroNIDAZOLE (METROCREAM) 0.75 % cream As directed      . nitroGLYCERIN (NITROSTAT) 0.4 MG SL tablet Place 1 tablet (0.4 mg total) under the tongue every 5 (five) minutes x 3 doses as needed for chest pain.  25 tablet  3  . omeprazole (PRILOSEC) 20 MG capsule Take 20 mg by mouth 2 (two) times daily before a meal.      . oxyCODONE-acetaminophen (  ROXICET) 5-325 MG per tablet Take 1 tablet by mouth every 4 (four) hours as needed for severe pain.  30 tablet  0  . pravastatin (PRAVACHOL) 40 MG tablet Take 40 mg by mouth at bedtime.       . Rivaroxaban (XARELTO) 20 MG TABS tablet Take 1 tablet (20 mg total) by mouth daily with supper.  90 tablet  3   No current facility-administered medications for this visit.    Allergies  Allergen Reactions  . Clinoril [Sulindac]     Yeast infection/itching  . Dilaudid [Hydromorphone Hcl]     Changed respirations  . Keflex [Cephalexin]     Yeast infection/itching  . Sulfa Antibiotics     Yeast infection/itching  . Tramadol     itching    Patient Active Problem List   Diagnosis Date Noted  . Right nephrolithiasis 08/27/2013  . Left nephrolithiasis 08/27/2013  .  Bradycardia 08/27/2013  . Atrial fibrillation 08/27/2013  . Chest pain at rest 12/15/2012  . New onset atrial fibrillation 12/03/2012  . LBBB (left bundle branch block) 12/03/2012  . Type 2 diabetes mellitus 12/03/2012  . Morbid obesity 12/03/2012  . Hypercalcemia 12/03/2012  . Elevated LFTs 12/03/2012  . OSA on CPAP 12/03/2012  . Hypercholesterolemia 12/03/2012  . Normocytic anemia 12/03/2012  . Cough 01/17/2012    History  Smoking status  . Never Smoker   Smokeless tobacco  . Never Used    History  Alcohol Use No    Family History  Problem Relation Age of Onset  . Brain cancer Father   . Heart disease Brother     Born with unknown heart defect    Review of Systems: Constitutional: no fever chills diaphoresis or fatigue or change in weight.  Head and neck: no hearing loss, no epistaxis, no photophobia or visual disturbance. Respiratory: No cough, shortness of breath or wheezing. Cardiovascular: No chest pain peripheral edema, palpitations. Gastrointestinal: No abdominal distention, no abdominal pain, no change in bowel habits hematochezia or melena. Genitourinary: No dysuria, no frequency, no urgency, no nocturia. Musculoskeletal:No arthralgias, no back pain, no gait disturbance or myalgias. Neurological: No dizziness, no headaches, no numbness, no seizures, no syncope, no weakness, no tremors. Hematologic: No lymphadenopathy, no easy bruising. Psychiatric: No confusion, no hallucinations, no sleep disturbance.    Physical Exam: Filed Vitals:   11/28/13 1421  BP: 143/79  Pulse: 75  The patient appears to be in no distress.  Patient is obese.  Head and neck exam reveals that the pupils are equal and reactive.  The extraocular movements are full.  There is no scleral icterus.  Mouth and pharynx are benign.  No lymphadenopathy.  No carotid bruits.  The jugular venous pressure is normal.  Thyroid is not enlarged or tender.  She has some tenderness along the right  mandible area.  She will see her ENT physician concerning this.  Chest is clear to percussion and auscultation.  No rales or rhonchi.  Expansion of the chest is symmetrical.  Heart reveals no abnormal lift or heave.  First and second heart sounds are normal.  There is no murmur gallop rub or click.  The abdomen is soft and nontender.  Bowel sounds are normoactive.  There is no hepatosplenomegaly or mass.  There are no abdominal bruits.  Extremities reveal no phlebitis or edema.  Pedal pulses are good.  There is no cyanosis or clubbing.  Neurologic exam is normal strength and no lateralizing weakness.  No sensory deficits.  Integument  reveals no rash    Assessment / Plan: 1.  Paroxysmal atrial fibrillation.  Chaddsvasc score of 3 2. morbid obesity 3. essential hypertension without heart failure 4. rate related left bundle branch block 5. Hypercholesterolemia 6. diabetes mellitus 7. Nephrolithiasis 8. obstructive sleep apnea  Plan: Continue same cardiac meds.  Okay to hold Xarelto for up to 4 days if urology surgery planned. Recheck here in 6 months for office visit and EKG

## 2013-11-28 NOTE — Assessment & Plan Note (Signed)
The patient has not been having a hypoglycemic episodes.  Some of the hand and feet numbness may be related to diabetic neuropathy.

## 2013-11-28 NOTE — Patient Instructions (Addendum)
Your physician recommends that you continue on your current medications as directed. Please refer to the Current Medication list given to you today.  Your physician wants you to follow-up in: 6 month ov You will receive a reminder letter in the mail two months in advance. If you don't receive a letter, please call our office to schedule the follow-up appointment.   Follow up with ENT regarding jaw pain

## 2013-11-28 NOTE — Assessment & Plan Note (Signed)
The patient may need some urological surgical procedures.  It will be okay to leave off her xarelto for up to 4 days prior to the procedure if necessary

## 2013-12-12 ENCOUNTER — Ambulatory Visit: Payer: Medicare Other | Admitting: Cardiology

## 2013-12-13 DIAGNOSIS — N200 Calculus of kidney: Secondary | ICD-10-CM | POA: Diagnosis not present

## 2013-12-13 DIAGNOSIS — R310 Gross hematuria: Secondary | ICD-10-CM | POA: Diagnosis not present

## 2013-12-14 ENCOUNTER — Telehealth: Payer: Self-pay | Admitting: Cardiology

## 2013-12-14 ENCOUNTER — Other Ambulatory Visit: Payer: Self-pay | Admitting: Urology

## 2013-12-14 NOTE — Telephone Encounter (Signed)
Ok to hold xarelto 72 hours before procedure

## 2013-12-14 NOTE — Telephone Encounter (Signed)
Per Dr. Mare Ferrari, patient is OK to hold Xarelto for 72 hours prior to lithotripsy. These instructions sent to [facility].Erby Pian

## 2013-12-14 NOTE — Telephone Encounter (Signed)
New message     Pt having lithotripsy on 10-9.  Can she hold her xeralto for 72hrs prior to procedure?  Please fax answer to [phone].

## 2013-12-14 NOTE — Telephone Encounter (Signed)
Will forward to Dr. Vaughan Browner for review and recommendations.

## 2013-12-18 ENCOUNTER — Telehealth: Payer: Self-pay | Admitting: Cardiology

## 2013-12-18 ENCOUNTER — Encounter (HOSPITAL_COMMUNITY): Payer: Self-pay | Admitting: Pharmacy Technician

## 2013-12-18 NOTE — Telephone Encounter (Signed)
Spoke with patient and advised ok to hold Xarelto 2 days prior to procedure per  Dr. Mare Ferrari

## 2013-12-18 NOTE — Telephone Encounter (Signed)
New message    Patient calling need to discuss with nurse / MD regarding upcoming surgery.

## 2013-12-31 ENCOUNTER — Encounter (HOSPITAL_COMMUNITY): Payer: Self-pay | Admitting: *Deleted

## 2014-01-01 DIAGNOSIS — N8320 Unspecified ovarian cysts: Secondary | ICD-10-CM | POA: Diagnosis not present

## 2014-01-02 NOTE — H&P (Signed)
Active Problems Problems  1. Cysts of both ovaries (620.2) 2. Detrusor instability (596.59) 3. Gross hematuria (599.71) 4. Kidney stone on left side (592.0) 5. Urge and stress incontinence (788.33)  History of Present Illness Kristin [name] returns today in f/u. She had right ESWL on 6/1 and was having hematuria. She passed a fragment about a week after her last visit and the hematuria stopped. She has seen some recurrent hematuria this week but not for a couple of days. She is having no pain or voiding complaints other than stable incontinence.  She is back on Xarelto.   Past Medical History Problems  1. History of Anxiety (300.00) 2. History of Arthritis (V13.4) 3. History of Diabetic peripheral neuropathy (250.60,357.2) 4. History of anemia (V12.3) 5. History of atrial fibrillation (V12.59) 6. History of depression (V11.8) 7. History of diabetes mellitus (V12.29) 8. History of esophageal reflux (V12.79) 9. History of hypercholesterolemia (V12.29) 10. History of hypertension (V12.59) 11. History of kidney disease (V13.09) 12. History of sleep apnea (V13.89) 13. History of urinary tract infection (V13.02) 14. History of Kidney stone on right side (592.0)  Surgical History Problems  1. History of Ankle Repair 2. History of Cholecystectomy 3. History of Colostomy 4. History of Colostomy Revision 5. History of Knee Arthroscopy 6. History of Knee Surgery Right 7. History of Leg Incision  (Below Knee) 8. History of Leg Repair 9. History of Lithotripsy 10. History of Oral Surgery Tooth Extraction 11. History of Rectovag Fistula Repair - Abd Appr With Concomit Colostomy 12. History of Rectovaginal Fistula Repair - Vaginal Approach 13. History of Tonsillectomy  Current Meds 1. BuPROPion HCl ER (XL) 150 MG Oral Tablet Extended Release 24 Hour;  Therapy: 16Apr2015 to Recorded 2. Byetta 10 MCG Pen SOLN;  Therapy: (Recorded:19May2011) to Recorded 3. Excedrin Tension Headache TABS;   Therapy: (Recorded:19May2011) to Recorded 4. Irbesartan 300 MG Oral Tablet;  Therapy: 16Apr2015 to Recorded 5. Lexapro 20 MG Oral Tablet;  Therapy: (Recorded:19May2011) to Recorded 6. LORazepam 1 MG Oral Tablet;  Therapy: (Recorded:19May2011) to Recorded 7. MetFORMIN HCl ER 500 MG Oral Tablet Extended Release 24 Hour;  Therapy: (Recorded:19May2011) to Recorded 8. Metoprolol Succinate ER 100 MG Oral Tablet Extended Release 24 Hour;  Therapy: [id number] to Recorded 9. Metoprolol Succinate ER 25 MG Oral Tablet Extended Release 24 Hour;  Therapy: 07Sep2014 to Recorded 10. Metoprolol Succinate ER 50 MG Oral Tablet Extended Release 24 Hour;   Therapy: [id number] to Recorded 11. Pravastatin Sodium 40 MG Oral Tablet;   Therapy: (Recorded:19May2011) to Recorded 12. PriLOSEC 20 MG Oral Capsule Delayed Release;   Therapy: (Recorded:19May2011) to Recorded 13. Ritalin 10 MG Oral Tablet; as needed for focus;   Therapy: (Recorded:21May2015) to Recorded 14. Voltaren 1 % GEL;   Therapy: (Recorded:21May2015) to Recorded 15. Xarelto 20 MG Oral Tablet;   Therapy: [id number] to Recorded  Allergies Medication  1. Dilaudid TABS 2. tramadol 3. Clinoril TABS 4. Keflex CAPS 5. Sulfa Drugs  Family History Problems  1. Family history of Death In The Family Father   died age 11-Brain tumor 2. Family history of diabetes mellitus (V18.0) : Mother 3. Family history of kidney stones (V18.69) : Mother, Father 4. Family history of malignant neoplasm of brain (V16.8) : Father 5. Family history of Hematuria 6. Family history of Nephrolithiasis : Brother 7. Family history of Polymyalgia : Mother  Social History Problems  1. Caffeine Use   2-3 per day 2. Marital History - Single 3. Never a smoker 4. Occupation:   retired 52. Social alcohol  use 6. Denied: History of Tobacco Use  Past and social history reviewed and updated.   Review of Systems Genitourinary, constitutional, skin, eye, otolaryngeal,  hematologic/lymphatic, cardiovascular, pulmonary, endocrine, musculoskeletal, gastrointestinal, neurological and psychiatric system(s) were reviewed and pertinent findings if present are noted.  Genitourinary: incontinence and hematuria.  Gastrointestinal: no nausea and no flank pain.  Constitutional: no fever.    Vitals Vital Signs [Data Includes: Last 1 Day]  Recorded: 17Sep2015 03:05PM  Blood Pressure: 169 / 95 Temperature: 98.3 F Heart Rate: 75  Physical Exam Constitutional: Well nourished and well developed . No acute distress.  Pulmonary: No respiratory distress and normal respiratory rhythm and effort.  Cardiovascular: Heart rate and rhythm are normal . No peripheral edema.    Results/Data Urine [Data Includes: Last 1 Day]   17Sep2015  COLOR YELLOW   APPEARANCE CLOUDY   SPECIFIC GRAVITY 1.015   pH 6.0   GLUCOSE NEG mg/dL  BILIRUBIN NEG   KETONE NEG mg/dL  BLOOD LARGE   PROTEIN NEG mg/dL  UROBILINOGEN 0.2 mg/dL  NITRITE NEG   LEUKOCYTE ESTERASE NEG   SQUAMOUS EPITHELIAL/HPF FEW   WBC 0-2 WBC/hpf  RBC TNTC RBC/hpf  BACTERIA FEW   CRYSTALS NONE SEEN   CASTS NONE SEEN    The following images/tracing/specimen were independently visualized:  KUB today shows a stable LLP renal stone but no right renal or ureteral stones. She has stable vascular calfications in the pelvis. She has lumbar degenerative changes but no other abnormal findings.  The following clinical lab reports were reviewed:  UA reviewed.    Assessment Assessed  1. Gross hematuria (599.71) 2. Kidney stone on left side (592.0) 3. History of Kidney stone on right side (592.0)  She is stone free from the right ESWL but still has hematuria.   Plan Health Maintenance  1. UA With REFLEX; [Do Not Release]; Status:Resulted - Requires Verification;   Done:  [id number] 02:55PM Kidney stone on left side  2. Follow-up Schedule Surgery Office  Follow-up  Status: Hold For - Appointment   Requested for:  17Sep2015 3. KUB; Status:Resulted - Requires Verification;   Done: [id number] 12:00AM  I will get her set up for left ESWL as we discussed and will have her stop the Xarelto per Dr. Mare Ferrari.  She is aware of the risks of the procedure.

## 2014-01-03 ENCOUNTER — Ambulatory Visit (HOSPITAL_COMMUNITY)
Admission: RE | Admit: 2014-01-03 | Discharge: 2014-01-03 | Disposition: A | Discharge status: Home / Self Care | Payer: Medicare Other | Source: Ambulatory Visit | Attending: Urology | Admitting: Urology

## 2014-01-03 ENCOUNTER — Ambulatory Visit (HOSPITAL_COMMUNITY): Payer: Medicare Other

## 2014-01-03 ENCOUNTER — Encounter (HOSPITAL_COMMUNITY)
Admission: RE | Disposition: A | Discharge status: Home / Self Care | Payer: Self-pay | Source: Ambulatory Visit | Attending: Urology

## 2014-01-03 ENCOUNTER — Encounter (HOSPITAL_COMMUNITY): Payer: Self-pay | Admitting: *Deleted

## 2014-01-03 DIAGNOSIS — Z881 Allergy status to other antibiotic agents status: Secondary | ICD-10-CM | POA: Diagnosis not present

## 2014-01-03 DIAGNOSIS — Z885 Allergy status to narcotic agent status: Secondary | ICD-10-CM | POA: Insufficient documentation

## 2014-01-03 DIAGNOSIS — Z882 Allergy status to sulfonamides status: Secondary | ICD-10-CM | POA: Diagnosis not present

## 2014-01-03 DIAGNOSIS — N200 Calculus of kidney: Secondary | ICD-10-CM | POA: Diagnosis not present

## 2014-01-03 DIAGNOSIS — Z01818 Encounter for other preprocedural examination: Secondary | ICD-10-CM | POA: Diagnosis not present

## 2014-01-03 DIAGNOSIS — Z79899 Other long term (current) drug therapy: Secondary | ICD-10-CM | POA: Insufficient documentation

## 2014-01-03 LAB — GLUCOSE, CAPILLARY: Glucose-Capillary: 104 mg/dL — ABNORMAL HIGH (ref 70–99)

## 2014-01-03 SURGERY — LITHOTRIPSY, ESWL
Anesthesia: LOCAL | Laterality: Left

## 2014-01-03 MED ORDER — DEXTROSE-NACL 5-0.45 % IV SOLN
INTRAVENOUS | Status: DC
  Administered 2014-01-03: 11:00:00 via INTRAVENOUS

## 2014-01-03 MED ORDER — DIPHENHYDRAMINE HCL 25 MG PO CAPS
25.0000 mg | ORAL_CAPSULE | ORAL | Status: AC
  Administered 2014-01-03: 25 mg via ORAL
  Filled 2014-01-03: qty 1

## 2014-01-03 MED ORDER — DIAZEPAM 5 MG PO TABS
10.0000 mg | ORAL_TABLET | ORAL | Status: AC
  Administered 2014-01-03: 10 mg via ORAL
  Filled 2014-01-03: qty 2

## 2014-01-03 MED ORDER — CIPROFLOXACIN HCL 500 MG PO TABS
500.0000 mg | ORAL_TABLET | ORAL | Status: AC
  Administered 2014-01-03: 500 mg via ORAL
  Filled 2014-01-03: qty 1

## 2014-01-03 NOTE — Discharge Instructions (Signed)
Lithotripsy, Care After Refer to this sheet in the next few weeks. These instructions provide you with information on caring for yourself after your procedure. Your health care provider may also give you more specific instructions. Your treatment has been planned according to current medical practices, but problems sometimes occur. Call your health care provider if you have any problems or questions after your procedure. WHAT TO EXPECT AFTER THE PROCEDURE   Your urine may have a red tinge for a few days after treatment. Blood loss is usually minimal.  You may have soreness in the back or flank area. This usually goes away after a few days. The procedure can cause blotches or bruises on the back where the pressure wave enters the skin. These marks usually cause only minimal discomfort and should disappear in a short time.  Stone fragments should begin to pass within 24 hours of treatment. However, a delayed passage is not unusual.  You may have pain, discomfort, and feel sick to your stomach (nauseated) when the crushed fragments of stone are passed down the tube from the kidney to the bladder. Stone fragments can pass soon after the procedure and may last for up to 4-8 weeks.  A small number of patients may have severe pain when stone fragments are not able to pass, which leads to an obstruction.  If your stone is greater than 1 inch (2.5 cm) in diameter or if you have multiple stones that have a combined diameter greater than 1 inch (2.5 cm), you may require more than one treatment.  If you had a stent placed prior to your procedure, you may experience some discomfort, especially during urination. You may experience the pain or discomfort in your flank or back, or you may experience a sharp pain or discomfort at the base of your penis or in your lower abdomen. The discomfort usually lasts only a few minutes after urinating. HOME CARE INSTRUCTIONS   Rest at home until you feel your energy  improving.  Only take over-the-counter or prescription medicines for pain, discomfort, or fever as directed by your health care provider. Depending on the type of lithotripsy, you may need to take antibiotics and anti-inflammatory medicines for a few days.  Drink enough water and fluids to keep your urine clear or pale yellow. This helps "flush" your kidneys. It helps pass any remaining pieces of stone and prevents stones from coming back.  Most people can resume daily activities within 1-2 days after standard lithotripsy. It can take longer to recover from laser and percutaneous lithotripsy.  If the stones are in your urinary system, you may be asked to strain your urine at home to look for stones. Any stones that are found can be sent to a medical lab for examination.  Visit your health care provider for a follow-up appointment in a few weeks. Your doctor may remove your stent if you have one. Your health care provider will also check to see whether stone particles still remain. SEEK MEDICAL CARE IF:   Your pain is not relieved by medicine.  You have a lasting nauseous feeling.  You feel there is too much blood in the urine.  You develop persistent problems with frequent or painful urination that does not at least partially improve after 2 days following the procedure.  You have a congested cough.  You feel lightheaded.  You develop a rash or any other signs that might suggest an allergic problem.  You develop any reaction or side effects to  your medicine(s). SEEK IMMEDIATE MEDICAL CARE IF:   You experience severe back or flank pain or both.  You see nothing but blood when you urinate.  You cannot pass any urine at all.  You have a fever or shaking chills.  You develop shortness of breath, difficulty breathing, or chest pain.  You develop vomiting that will not stop after 6-8 hours.  You have a fainting episode. Document Released: 04/04/2007 Document Revised: 01/03/2013  Document Reviewed: 09/28/2012 [facility] Patient Information 2015 [place], [place]. This information is not intended to replace advice given to you by your health care provider. Make sure you discuss any questions you have with your health care provider.  Do not resume Xarelto until we give you clearance after the renal ultrasound on 10/12.

## 2014-01-03 NOTE — Interval H&P Note (Signed)
History and Physical Interval Note:  01/03/2014 12:57 PM  Kristin [name]  has presented today for surgery, with the diagnosis of left lower pole stone   The various methods of treatment have been discussed with the patient and family. After consideration of risks, benefits and other options for treatment, the patient has consented to  Procedure(s): LEFT EXTRACORPOREAL SHOCK WAVE LITHOTRIPSY (ESWL) (Left) as a surgical intervention .  The patient's history has been reviewed, patient examined, no change in status, stable for surgery.  I have reviewed the patient's chart and labs.  Questions were answered to the patient's satisfaction.     [name]

## 2014-01-07 DIAGNOSIS — N200 Calculus of kidney: Secondary | ICD-10-CM | POA: Diagnosis not present

## 2014-01-14 DIAGNOSIS — N181 Chronic kidney disease, stage 1: Secondary | ICD-10-CM | POA: Diagnosis not present

## 2014-01-14 DIAGNOSIS — I129 Hypertensive chronic kidney disease with stage 1 through stage 4 chronic kidney disease, or unspecified chronic kidney disease: Secondary | ICD-10-CM | POA: Diagnosis not present

## 2014-01-14 DIAGNOSIS — I480 Paroxysmal atrial fibrillation: Secondary | ICD-10-CM | POA: Diagnosis not present

## 2014-01-14 DIAGNOSIS — F322 Major depressive disorder, single episode, severe without psychotic features: Secondary | ICD-10-CM | POA: Diagnosis not present

## 2014-01-14 DIAGNOSIS — E1121 Type 2 diabetes mellitus with diabetic nephropathy: Secondary | ICD-10-CM | POA: Diagnosis not present

## 2014-01-14 DIAGNOSIS — E785 Hyperlipidemia, unspecified: Secondary | ICD-10-CM | POA: Diagnosis not present

## 2014-01-14 DIAGNOSIS — F900 Attention-deficit hyperactivity disorder, predominantly inattentive type: Secondary | ICD-10-CM | POA: Diagnosis not present

## 2014-01-25 ENCOUNTER — Emergency Department (HOSPITAL_COMMUNITY)
Admission: EM | Admit: 2014-01-25 | Discharge: 2014-01-25 | Disposition: A | Discharge status: Home / Self Care | Payer: Medicare Other | Attending: Emergency Medicine | Admitting: Emergency Medicine

## 2014-01-25 ENCOUNTER — Encounter (HOSPITAL_COMMUNITY): Payer: Self-pay | Admitting: Emergency Medicine

## 2014-01-25 ENCOUNTER — Emergency Department (HOSPITAL_COMMUNITY): Payer: Medicare Other

## 2014-01-25 DIAGNOSIS — I10 Essential (primary) hypertension: Secondary | ICD-10-CM | POA: Diagnosis not present

## 2014-01-25 DIAGNOSIS — R1012 Left upper quadrant pain: Secondary | ICD-10-CM | POA: Insufficient documentation

## 2014-01-25 DIAGNOSIS — G4733 Obstructive sleep apnea (adult) (pediatric): Secondary | ICD-10-CM | POA: Diagnosis not present

## 2014-01-25 DIAGNOSIS — R1032 Left lower quadrant pain: Secondary | ICD-10-CM | POA: Diagnosis not present

## 2014-01-25 DIAGNOSIS — R110 Nausea: Secondary | ICD-10-CM | POA: Insufficient documentation

## 2014-01-25 DIAGNOSIS — E669 Obesity, unspecified: Secondary | ICD-10-CM | POA: Diagnosis not present

## 2014-01-25 DIAGNOSIS — Z87828 Personal history of other (healed) physical injury and trauma: Secondary | ICD-10-CM | POA: Diagnosis not present

## 2014-01-25 DIAGNOSIS — G43909 Migraine, unspecified, not intractable, without status migrainosus: Secondary | ICD-10-CM | POA: Insufficient documentation

## 2014-01-25 DIAGNOSIS — I480 Paroxysmal atrial fibrillation: Secondary | ICD-10-CM | POA: Diagnosis not present

## 2014-01-25 DIAGNOSIS — E119 Type 2 diabetes mellitus without complications: Secondary | ICD-10-CM | POA: Insufficient documentation

## 2014-01-25 DIAGNOSIS — F418 Other specified anxiety disorders: Secondary | ICD-10-CM | POA: Insufficient documentation

## 2014-01-25 DIAGNOSIS — K219 Gastro-esophageal reflux disease without esophagitis: Secondary | ICD-10-CM | POA: Diagnosis not present

## 2014-01-25 DIAGNOSIS — R109 Unspecified abdominal pain: Secondary | ICD-10-CM | POA: Diagnosis present

## 2014-01-25 DIAGNOSIS — Z79899 Other long term (current) drug therapy: Secondary | ICD-10-CM | POA: Insufficient documentation

## 2014-01-25 DIAGNOSIS — Z792 Long term (current) use of antibiotics: Secondary | ICD-10-CM | POA: Insufficient documentation

## 2014-01-25 DIAGNOSIS — N200 Calculus of kidney: Secondary | ICD-10-CM | POA: Insufficient documentation

## 2014-01-25 DIAGNOSIS — E780 Pure hypercholesterolemia: Secondary | ICD-10-CM | POA: Insufficient documentation

## 2014-01-25 DIAGNOSIS — Z9981 Dependence on supplemental oxygen: Secondary | ICD-10-CM | POA: Insufficient documentation

## 2014-01-25 DIAGNOSIS — N202 Calculus of kidney with calculus of ureter: Secondary | ICD-10-CM | POA: Diagnosis not present

## 2014-01-25 DIAGNOSIS — N201 Calculus of ureter: Secondary | ICD-10-CM | POA: Diagnosis not present

## 2014-01-25 DIAGNOSIS — F909 Attention-deficit hyperactivity disorder, unspecified type: Secondary | ICD-10-CM | POA: Diagnosis not present

## 2014-01-25 LAB — CBC WITH DIFFERENTIAL/PLATELET
Basophils Absolute: 0.1 10*3/uL (ref 0.0–0.1)
Basophils Relative: 0 % (ref 0–1)
Eosinophils Absolute: 0.1 10*3/uL (ref 0.0–0.7)
Eosinophils Relative: 0 % (ref 0–5)
HCT: 40 % (ref 36.0–46.0)
Hemoglobin: 11.9 g/dL — ABNORMAL LOW (ref 12.0–15.0)
Lymphocytes Relative: 8 % — ABNORMAL LOW (ref 12–46)
Lymphs Abs: 1 10*3/uL (ref 0.7–4.0)
MCH: 22.6 pg — ABNORMAL LOW (ref 26.0–34.0)
MCHC: 29.8 g/dL — ABNORMAL LOW (ref 30.0–36.0)
MCV: 75.9 fL — ABNORMAL LOW (ref 78.0–100.0)
Monocytes Absolute: 0.5 10*3/uL (ref 0.1–1.0)
Monocytes Relative: 4 % (ref 3–12)
Neutro Abs: 10.9 10*3/uL — ABNORMAL HIGH (ref 1.7–7.7)
Neutrophils Relative %: 88 % — ABNORMAL HIGH (ref 43–77)
Platelets: 398 10*3/uL (ref 150–400)
RBC: 5.27 MIL/uL — ABNORMAL HIGH (ref 3.87–5.11)
RDW: 16.6 % — ABNORMAL HIGH (ref 11.5–15.5)
WBC: 12.5 10*3/uL — ABNORMAL HIGH (ref 4.0–10.5)

## 2014-01-25 LAB — URINALYSIS, ROUTINE W REFLEX MICROSCOPIC
Bilirubin Urine: NEGATIVE
Glucose, UA: NEGATIVE mg/dL
Hgb urine dipstick: NEGATIVE
Ketones, ur: NEGATIVE mg/dL
Leukocytes, UA: NEGATIVE
Nitrite: NEGATIVE
Protein, ur: NEGATIVE mg/dL
Specific Gravity, Urine: 1.011 (ref 1.005–1.030)
Urobilinogen, UA: 0.2 mg/dL (ref 0.0–1.0)
pH: 7.5 (ref 5.0–8.0)

## 2014-01-25 LAB — COMPREHENSIVE METABOLIC PANEL
ALT: 26 U/L (ref 0–35)
AST: 23 U/L (ref 0–37)
Albumin: 3.8 g/dL (ref 3.5–5.2)
Alkaline Phosphatase: 96 U/L (ref 39–117)
Anion gap: 16 — ABNORMAL HIGH (ref 5–15)
BUN: 14 mg/dL (ref 6–23)
CO2: 27 mEq/L (ref 19–32)
Calcium: 9.7 mg/dL (ref 8.4–10.5)
Chloride: 100 mEq/L (ref 96–112)
Creatinine, Ser: 1.09 mg/dL (ref 0.50–1.10)
GFR calc Af Amer: 64 mL/min — ABNORMAL LOW (ref >90)
GFR calc non Af Amer: 55 mL/min — ABNORMAL LOW (ref >90)
Glucose, Bld: 135 mg/dL — ABNORMAL HIGH (ref 70–99)
Potassium: 4.3 mEq/L (ref 3.7–5.3)
Sodium: 143 mEq/L (ref 137–147)
Total Bilirubin: 0.5 mg/dL (ref 0.3–1.2)
Total Protein: 7.5 g/dL (ref 6.0–8.3)

## 2014-01-25 LAB — LIPASE, BLOOD: Lipase: 79 U/L — ABNORMAL HIGH (ref 11–59)

## 2014-01-25 MED ORDER — SODIUM CHLORIDE 0.9 % IV SOLN
1000.0000 mL | INTRAVENOUS | Status: DC
  Administered 2014-01-25: 1000 mL via INTRAVENOUS

## 2014-01-25 MED ORDER — ONDANSETRON 8 MG PO TBDP: ORAL_TABLET | ORAL | Status: DC

## 2014-01-25 MED ORDER — ONDANSETRON HCL 4 MG/2ML IJ SOLN
4.0000 mg | Freq: Once | INTRAMUSCULAR | Status: AC
  Administered 2014-01-25: 4 mg via INTRAVENOUS
  Filled 2014-01-25: qty 2

## 2014-01-25 MED ORDER — MORPHINE SULFATE 4 MG/ML IJ SOLN
4.0000 mg | Freq: Once | INTRAMUSCULAR | Status: AC
  Administered 2014-01-25: 4 mg via INTRAVENOUS
  Filled 2014-01-25: qty 1

## 2014-01-25 MED ORDER — OXYCODONE-ACETAMINOPHEN 5-325 MG PO TABS
2.0000 | ORAL_TABLET | Freq: Once | ORAL | Status: AC
  Administered 2014-01-25: 2 via ORAL
  Filled 2014-01-25: qty 2

## 2014-01-25 MED ORDER — OXYCODONE-ACETAMINOPHEN 10-325 MG PO TABS: 1.0000 | ORAL_TABLET | ORAL | Status: DC | PRN

## 2014-01-25 NOTE — ED Notes (Signed)
Pt c/o left flank pain that started this afternoon. Pt states that she had lithotripsy couple weks ago and has only passed one small little piece.  Pt has been nauseated but denies fever.  Pt had tooth implant and is currently on amoxicillin.

## 2014-01-25 NOTE — Discharge Instructions (Signed)
1. Medications: percocet, zofran, usual home medications 2. Treatment: rest, drink plenty of fluids,  3. Follow Up: Please followup with your urology just on Monday for discussion of your diagnoses and further evaluation after today's visit; if you do not have a primary care doctor use the resource guide provided to find one; Please return to the ER for worsening pain, intractable vomiting, high fevers or other concerning symptoms.  Ureteral Colic (Kidney Stones) Ureteral colic is the result of a condition when kidney stones form inside the kidney. Once kidney stones are formed they may move into the tube that connects the kidney with the bladder (ureter). If this occurs, this condition may cause pain (colic) in the ureter.  CAUSES  Pain is caused by stone movement in the ureter and the obstruction caused by the stone. SYMPTOMS  The pain comes and goes as the ureter contracts around the stone. The pain is usually intense, sharp, and stabbing in character. The location of the pain may move as the stone moves through the ureter. When the stone is near the kidney the pain is usually located in the back and radiates to the belly (abdomen). When the stone is ready to pass into the bladder the pain is often located in the lower abdomen on the side the stone is located. At this location, the symptoms may mimic those of a urinary tract infection with urinary frequency. Once the stone is located here it often passes into the bladder and the pain disappears completely. TREATMENT   Your caregiver will provide you with medicine for pain relief.  You may require specialized follow-up X-rays.  The absence of pain does not always mean that the stone has passed. It may have just stopped moving. If the urine remains completely obstructed, it can cause loss of kidney function or even complete destruction of the involved kidney. It is your responsibility and in your interest that X-rays and follow-ups as suggested by  your caregiver are completed. Relief of pain without passage of the stone can be associated with severe damage to the kidney, including loss of kidney function on that side.  If your stone does not pass on its own, additional measures may be taken by your caregiver to ensure its removal. HOME CARE INSTRUCTIONS   Increase your fluid intake. Water is the preferred fluid since juices containing vitamin C may acidify the urine making it less likely for certain stones (uric acid stones) to pass.  Strain all urine. A strainer will be provided. Keep all particulate matter or stones for your caregiver to inspect.  Take your pain medicine as directed.  Make a follow-up appointment with your caregiver as directed.  Remember that the goal is passage of your stone. The absence of pain does not mean the stone is gone. Follow your caregiver's instructions.  Only take over-the-counter or prescription medicines for pain, discomfort, or fever as directed by your caregiver. SEEK MEDICAL CARE IF:   Pain cannot be controlled with the prescribed medicine.  You have a fever.  Pain continues for longer than your caregiver advises it should.  There is a change in the pain, and you develop chest discomfort or constant abdominal pain.  You feel faint or pass out. MAKE SURE YOU:   Understand these instructions.  Will watch your condition.  Will get help right away if you are not doing well or get worse. Document Released: 12/23/2004 Document Revised: 07/10/2012 Document Reviewed: 09/09/2010 [facility] Patient Information 2015 [place], [place]. This information is  not intended to replace advice given to you by your health care provider. Make sure you discuss any questions you have with your health care provider.

## 2014-01-25 NOTE — ED Notes (Signed)
Pt tried to give [place] urine sample but instead wet the bed. Pt stated she will try again in a little bit.
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
## 2014-01-25 NOTE — ED Notes (Signed)
Patient transported to CT 

## 2014-01-25 NOTE — ED Notes (Signed)
PA at bedside.

## 2014-01-26 NOTE — ED Provider Notes (Signed)
Medical screening examination/treatment/procedure(s) were performed by non-physician practitioner and as supervising physician I was immediately available for consultation/collaboration.   EKG Interpretation None        Ernestina Patches, MD 01/26/14 1337

## 2014-01-29 DIAGNOSIS — N200 Calculus of kidney: Secondary | ICD-10-CM | POA: Diagnosis not present

## 2014-01-30 ENCOUNTER — Other Ambulatory Visit: Payer: Self-pay | Admitting: Urology

## 2014-01-30 ENCOUNTER — Encounter (HOSPITAL_BASED_OUTPATIENT_CLINIC_OR_DEPARTMENT_OTHER)
Admission: RE | Disposition: A | Discharge status: Home / Self Care | Payer: Self-pay | Source: Ambulatory Visit | Attending: Urology

## 2014-01-30 ENCOUNTER — Ambulatory Visit (HOSPITAL_BASED_OUTPATIENT_CLINIC_OR_DEPARTMENT_OTHER): Payer: Medicare Other | Admitting: Anesthesiology

## 2014-01-30 ENCOUNTER — Encounter (HOSPITAL_BASED_OUTPATIENT_CLINIC_OR_DEPARTMENT_OTHER): Payer: Self-pay | Admitting: *Deleted

## 2014-01-30 ENCOUNTER — Ambulatory Visit (HOSPITAL_BASED_OUTPATIENT_CLINIC_OR_DEPARTMENT_OTHER)
Admission: RE | Admit: 2014-01-30 | Discharge: 2014-01-30 | Disposition: A | Discharge status: Home / Self Care | Payer: Medicare Other | Source: Ambulatory Visit | Attending: Urology | Admitting: Urology

## 2014-01-30 DIAGNOSIS — N201 Calculus of ureter: Secondary | ICD-10-CM | POA: Diagnosis not present

## 2014-01-30 DIAGNOSIS — G4730 Sleep apnea, unspecified: Secondary | ICD-10-CM | POA: Insufficient documentation

## 2014-01-30 DIAGNOSIS — I4891 Unspecified atrial fibrillation: Secondary | ICD-10-CM | POA: Diagnosis not present

## 2014-01-30 DIAGNOSIS — Z6841 Body Mass Index (BMI) 40.0 and over, adult: Secondary | ICD-10-CM | POA: Diagnosis not present

## 2014-01-30 DIAGNOSIS — K219 Gastro-esophageal reflux disease without esophagitis: Secondary | ICD-10-CM | POA: Diagnosis not present

## 2014-01-30 DIAGNOSIS — N3946 Mixed incontinence: Secondary | ICD-10-CM | POA: Insufficient documentation

## 2014-01-30 DIAGNOSIS — R310 Gross hematuria: Secondary | ICD-10-CM | POA: Diagnosis not present

## 2014-01-30 DIAGNOSIS — N319 Neuromuscular dysfunction of bladder, unspecified: Secondary | ICD-10-CM | POA: Diagnosis not present

## 2014-01-30 DIAGNOSIS — I10 Essential (primary) hypertension: Secondary | ICD-10-CM | POA: Diagnosis not present

## 2014-01-30 DIAGNOSIS — N202 Calculus of kidney with calculus of ureter: Secondary | ICD-10-CM | POA: Diagnosis not present

## 2014-01-30 DIAGNOSIS — E780 Pure hypercholesterolemia: Secondary | ICD-10-CM | POA: Insufficient documentation

## 2014-01-30 DIAGNOSIS — M1990 Unspecified osteoarthritis, unspecified site: Secondary | ICD-10-CM | POA: Insufficient documentation

## 2014-01-30 DIAGNOSIS — E1142 Type 2 diabetes mellitus with diabetic polyneuropathy: Secondary | ICD-10-CM | POA: Diagnosis not present

## 2014-01-30 DIAGNOSIS — E669 Obesity, unspecified: Secondary | ICD-10-CM | POA: Insufficient documentation

## 2014-01-30 DIAGNOSIS — F419 Anxiety disorder, unspecified: Secondary | ICD-10-CM | POA: Insufficient documentation

## 2014-01-30 DIAGNOSIS — F329 Major depressive disorder, single episode, unspecified: Secondary | ICD-10-CM | POA: Insufficient documentation

## 2014-01-30 HISTORY — DX: Unspecified abdominal hernia without obstruction or gangrene: K46.9

## 2014-01-30 HISTORY — DX: Hyperlipidemia, unspecified: E78.5

## 2014-01-30 HISTORY — DX: Other intervertebral disc displacement, lumbar region: M51.26

## 2014-01-30 HISTORY — DX: Type 2 diabetes mellitus with diabetic polyneuropathy: E11.42

## 2014-01-30 HISTORY — DX: Obstructive sleep apnea (adult) (pediatric): G47.33

## 2014-01-30 HISTORY — DX: Stress incontinence (female) (male): N39.3

## 2014-01-30 HISTORY — DX: Unspecified ovarian cyst, right side: N83.202

## 2014-01-30 HISTORY — DX: Personal history of urinary calculi: Z87.442

## 2014-01-30 HISTORY — DX: Personal history of traumatic brain injury: Z87.820

## 2014-01-30 HISTORY — PX: CYSTOSCOPY WITH STENT PLACEMENT: SHX5790

## 2014-01-30 HISTORY — DX: Personal history of other diseases of the nervous system and sense organs: Z86.69

## 2014-01-30 HISTORY — DX: Overactive bladder: N32.81

## 2014-01-30 HISTORY — DX: Left bundle-branch block, unspecified: I44.7

## 2014-01-30 HISTORY — DX: Calculus of ureter: N20.1

## 2014-01-30 HISTORY — DX: Unspecified ovarian cyst, right side: N83.201

## 2014-01-30 HISTORY — DX: Dependence on other enabling machines and devices: Z99.89

## 2014-01-30 LAB — GLUCOSE, CAPILLARY
Glucose-Capillary: 101 mg/dL — ABNORMAL HIGH (ref 70–99)
Glucose-Capillary: 144 mg/dL — ABNORMAL HIGH (ref 70–99)
Glucose-Capillary: 100 mg/dL — ABNORMAL HIGH (ref 70–99)

## 2014-01-30 SURGERY — CYSTOSCOPY, WITH STENT INSERTION
Anesthesia: General | Site: Ureter | Laterality: Left

## 2014-01-30 MED ORDER — PHENAZOPYRIDINE HCL 100 MG PO TABS
ORAL_TABLET | ORAL | Status: AC
  Filled 2014-01-30: qty 2

## 2014-01-30 MED ORDER — PROMETHAZINE HCL 25 MG/ML IJ SOLN
6.2500 mg | INTRAMUSCULAR | Status: DC | PRN
  Filled 2014-01-30: qty 1

## 2014-01-30 MED ORDER — FENTANYL CITRATE 0.05 MG/ML IJ SOLN
INTRAMUSCULAR | Status: AC
  Filled 2014-01-30: qty 2

## 2014-01-30 MED ORDER — ACETAMINOPHEN 650 MG RE SUPP
650.0000 mg | RECTAL | Status: DC | PRN
  Filled 2014-01-30: qty 1

## 2014-01-30 MED ORDER — LACTATED RINGERS IV SOLN
INTRAVENOUS | Status: DC
  Administered 2014-01-30: 10:00:00 via INTRAVENOUS
  Filled 2014-01-30: qty 1000

## 2014-01-30 MED ORDER — PROPOFOL 10 MG/ML IV BOLUS
INTRAVENOUS | Status: DC | PRN
  Administered 2014-01-30: 80 mg via INTRAVENOUS
  Administered 2014-01-30: 300 mg via INTRAVENOUS

## 2014-01-30 MED ORDER — FENTANYL CITRATE 0.05 MG/ML IJ SOLN
25.0000 ug | INTRAMUSCULAR | Status: DC | PRN
Start: 2014-01-30 — End: 2014-01-30
  Filled 2014-01-30: qty 1

## 2014-01-30 MED ORDER — CIPROFLOXACIN IN D5W 400 MG/200ML IV SOLN
INTRAVENOUS | Status: DC | PRN
  Administered 2014-01-30: 400 mg via INTRAVENOUS

## 2014-01-30 MED ORDER — FENTANYL CITRATE 0.05 MG/ML IJ SOLN
25.0000 ug | INTRAMUSCULAR | Status: DC | PRN
  Administered 2014-01-30: 25 ug via INTRAVENOUS
  Filled 2014-01-30: qty 1

## 2014-01-30 MED ORDER — CIPROFLOXACIN IN D5W 400 MG/200ML IV SOLN
400.0000 mg | INTRAVENOUS | Status: DC
  Filled 2014-01-30: qty 200

## 2014-01-30 MED ORDER — MIDAZOLAM HCL 2 MG/2ML IJ SOLN
INTRAMUSCULAR | Status: AC
  Filled 2014-01-30: qty 2

## 2014-01-30 MED ORDER — OXYBUTYNIN CHLORIDE 5 MG PO TABS
5.0000 mg | ORAL_TABLET | Freq: Once | ORAL | Status: AC
  Administered 2014-01-30: 5 mg via ORAL
  Filled 2014-01-30: qty 1

## 2014-01-30 MED ORDER — LIDOCAINE HCL (CARDIAC) 20 MG/ML IV SOLN
INTRAVENOUS | Status: DC | PRN
  Administered 2014-01-30: 70 mg via INTRAVENOUS

## 2014-01-30 MED ORDER — FENTANYL CITRATE 0.05 MG/ML IJ SOLN
INTRAMUSCULAR | Status: AC
  Filled 2014-01-30: qty 4

## 2014-01-30 MED ORDER — PHENAZOPYRIDINE HCL 200 MG PO TABS: 200.0000 mg | ORAL_TABLET | Freq: Three times a day (TID) | ORAL | Status: DC | PRN

## 2014-01-30 MED ORDER — OXYBUTYNIN CHLORIDE 5 MG PO TABS
ORAL_TABLET | ORAL | Status: AC
  Filled 2014-01-30: qty 1

## 2014-01-30 MED ORDER — ONDANSETRON HCL 4 MG/2ML IJ SOLN
INTRAMUSCULAR | Status: DC | PRN
  Administered 2014-01-30: 4 mg via INTRAVENOUS

## 2014-01-30 MED ORDER — PHENAZOPYRIDINE HCL 200 MG PO TABS
200.0000 mg | ORAL_TABLET | Freq: Three times a day (TID) | ORAL | Status: AC
  Administered 2014-01-30: 200 mg via ORAL
  Filled 2014-01-30: qty 1

## 2014-01-30 MED ORDER — FENTANYL CITRATE 0.05 MG/ML IJ SOLN
INTRAMUSCULAR | Status: DC | PRN
  Administered 2014-01-30: 50 ug via INTRAVENOUS

## 2014-01-30 MED ORDER — STERILE WATER FOR IRRIGATION IR SOLN
Status: DC | PRN
  Administered 2014-01-30: 3000 mL

## 2014-01-30 MED ORDER — CIPROFLOXACIN IN D5W 400 MG/200ML IV SOLN
INTRAVENOUS | Status: AC
  Filled 2014-01-30: qty 200

## 2014-01-30 MED ORDER — OXYCODONE HCL 5 MG PO TABS
5.0000 mg | ORAL_TABLET | ORAL | Status: DC | PRN
  Filled 2014-01-30: qty 2

## 2014-01-30 MED ORDER — SODIUM CHLORIDE 0.9 % IJ SOLN
3.0000 mL | Freq: Two times a day (BID) | INTRAMUSCULAR | Status: DC
  Filled 2014-01-30: qty 3

## 2014-01-30 MED ORDER — SODIUM CHLORIDE 0.9 % IV SOLN
250.0000 mL | INTRAVENOUS | Status: DC | PRN
  Filled 2014-01-30: qty 250

## 2014-01-30 MED ORDER — ACETAMINOPHEN 325 MG PO TABS
650.0000 mg | ORAL_TABLET | ORAL | Status: DC | PRN
  Filled 2014-01-30: qty 2

## 2014-01-30 MED ORDER — SODIUM CHLORIDE 0.9 % IJ SOLN
3.0000 mL | INTRAMUSCULAR | Status: DC | PRN
  Filled 2014-01-30: qty 3

## 2014-01-30 SURGICAL SUPPLY — 22 items
BAG DRAIN URO-CYSTO SKYTR STRL (DRAIN) ×2 IMPLANT
CANISTER SUCT LVC 12 LTR MEDI- (MISCELLANEOUS) ×2 IMPLANT
CATH URET 5FR 28IN CONE TIP (BALLOONS)
CATH URET 5FR 28IN OPEN ENDED (CATHETERS) ×2 IMPLANT
CATH URET 5FR 70CM CONE TIP (BALLOONS) IMPLANT
CLOTH BEACON ORANGE TIMEOUT ST (SAFETY) ×2 IMPLANT
DRAPE CAMERA CLOSED 9X96 (DRAPES) ×2 IMPLANT
GLOVE BIO SURGEON STRL SZ 6.5 (GLOVE) ×2 IMPLANT
GLOVE BIOGEL PI IND STRL 6.5 (GLOVE) ×1 IMPLANT
GLOVE BIOGEL PI INDICATOR 6.5 (GLOVE) ×1
GLOVE SURG SS PI 8.0 STRL IVOR (GLOVE) ×2 IMPLANT
GOWN STRL REUS W/ TWL LRG LVL3 (GOWN DISPOSABLE) ×1 IMPLANT
GOWN STRL REUS W/ TWL XL LVL3 (GOWN DISPOSABLE) ×1 IMPLANT
GOWN STRL REUS W/TWL LRG LVL3 (GOWN DISPOSABLE) ×1
GOWN STRL REUS W/TWL XL LVL3 (GOWN DISPOSABLE) ×1
GUIDEWIRE 0.038 PTFE COATED (WIRE) IMPLANT
GUIDEWIRE ANG ZIPWIRE 038X150 (WIRE) IMPLANT
GUIDEWIRE STR DUAL SENSOR (WIRE) ×2 IMPLANT
NS IRRIG 500ML POUR BTL (IV SOLUTION) IMPLANT
PACK CYSTO (CUSTOM PROCEDURE TRAY) ×2 IMPLANT
STENT URET 6FRX26 CONTOUR (STENTS) ×2 IMPLANT
WATER STERILE IRR 3000ML UROMA (IV SOLUTION) ×2 IMPLANT

## 2014-01-30 NOTE — Discharge Instructions (Addendum)
Ureteral Stent Implantation Ureteral stent implantation is the implantation of a soft plastic tube with multiple holes into the tube that drains urine from your kidney to your bladder (ureter). The stent helps drain your kidney when there is a blockage of the flow of urine in your ureter. The stent has a coil on each end to keep it from falling out. One end stays in the kidney. The other end stays in the bladder. It is most often taken out after any blockage has been removed or your ureter has healed. Short-term stents have a string attached to make removal quite easy. Removal of a short-term stent can be done in your health care provider's office or by you at home. Long-term stents need to be changed every few months. LET [facility] CARE PROVIDER KNOW ABOUT:  Any allergies you have.  All medicines you are taking, including vitamins, herbs, eye drops, creams, and over-the-counter medicines.  Previous problems you or members of your family have had with the use of anesthetics.  Any blood disorders you have.  Previous surgeries you have had.  Medical conditions you have. RISKS AND COMPLICATIONS Generally, ureteral stent implantation is a safe procedure. However, as with any procedure, complications can occur. Possible complications include:  Movement of the stent away from where it was originally placed (migration). This may affect the ability of the stent to properly drain your kidney. If migration of the stent occurs, the stent may need to be replaced or repositioned.  Perforation of the ureter.  Infection. BEFORE THE PROCEDURE  You may be asked to wash your genital area with sterile soap the morning of your procedure.  You may be given an oral antibiotic which you should take with a sip of water as prescribed by your health care provider.  You may be asked to not eat or drink for 8 hours before the surgery. PROCEDURE  First you will be given an anesthetic so you do not feel pain  during the procedure.  Your health care provider will insert a special lighted instrument called a cystoscope into your bladder. This allows your health care provider to see the opening to your ureter.  A thin wire is carefully threaded into your bladder and up the ureter. The stent is inserted over the wire and the wire is then removed.  Your bladder will be emptied of urine. AFTER THE PROCEDURE You will be taken to a recovery room until it is okay for you to go home. Document Released: 03/12/2000 Document Revised: 03/20/2013 Document Reviewed: 08/22/2012 [facility] Patient Information 2015 [place], [place]. This information is not intended to replace advice given to you by your health care provider. Make sure you discuss any questions you have with your health care provider.    Post Anesthesia Home Care Instructions  Activity: Get plenty of rest for the remainder of the day. A responsible adult should stay with you for 24 hours following the procedure.  For the next 24 hours, DO NOT: -Drive a car -Paediatric nurse -Drink alcoholic beverages -Take any medication unless instructed by your physician -Make any legal decisions or sign important papers.  Meals: Start with liquid foods such as gelatin or soup. Progress to regular foods as tolerated. Avoid greasy, spicy, heavy foods. If nausea and/or vomiting occur, drink only clear liquids until the nausea and/or vomiting subsides. Call your physician if vomiting continues.  Special Instructions/Symptoms: Your throat may feel dry or sore from the anesthesia or the breathing tube placed in your throat  during surgery. If this causes discomfort, gargle with warm salt water. The discomfort should disappear within 24 hours.   Call your surgeon if you experience:   1.  Fever over 101.0. 2.  Inability to urinate. 3.  Nausea and/or vomiting. 4.  Extreme swelling or bruising at the surgical site. 5.  Continued bleeding from the incision. 6.   Increased pain, redness or drainage from the incision. 7.  Problems related to your pain medication. 8. Any change in color, movement and/or sensation 9. Any problems and/or concerns

## 2014-01-30 NOTE — Anesthesia Procedure Notes (Signed)
Procedure Name: LMA Insertion Date/Time: 01/30/2014 11:58 AM Performed by: Bethena Roys T Pre-anesthesia Checklist: Patient identified, Emergency Drugs available, Suction available and Patient being monitored Patient Re-evaluated:Patient Re-evaluated prior to inductionOxygen Delivery Method: Circle System Utilized Preoxygenation: Pre-oxygenation with 100% oxygen Intubation Type: IV induction Ventilation: Mask ventilation without difficulty LMA: LMA with gastric port inserted LMA Size: 5.0 Number of attempts: 1 Placement Confirmation: positive ETCO2 Tube secured with: Tape Dental Injury: Teeth and Oropharynx as per pre-operative assessment

## 2014-01-30 NOTE — Interval H&P Note (Signed)
History and Physical Interval Note:  Kristin [name] was in the office yesterday after being seen in the ER over the weekend.  She was found to have 2 proximal ureteral stone fragments and a cluster of fragments in the LLP.   A KUB yesterday only showed one stone at L4 and a cluster of fragments in the LLP.   I discussed the treatment options and since she is on Xarelto, I am going to get her in for a stent placement today and will arrange ureteroscopy for next week with her off of the Xarelto.    01/30/2014 8:46 AM  Kristin [name]  has presented today for surgery, with the diagnosis of LEFT PROXIMAL STONE  The various methods of treatment have been discussed with the patient and family. After consideration of risks, benefits and other options for treatment, the patient has consented to  Procedure(s): CYSTO WITH LEFT STENT INSERTION (Left) as a surgical intervention .  The patient's history has been reviewed, patient examined, no change in status, stable for surgery.  I have reviewed the patient's chart and labs.  Questions were answered to the patient's satisfaction.     Ericson Nafziger J

## 2014-01-30 NOTE — Op Note (Signed)
NAMEMarland Kitchen  Kristin [name], Kristin [name] NO.:  [id number]  MEDICAL RECORD NO.:  [id number]  LOCATION:                                 FACILITY:  PHYSICIAN:  Marshall Cork. Jeffie Pollock, M.D.    DATE OF BIRTH:  1955-05-16  DATE OF PROCEDURE:  01/30/2014 DATE OF DISCHARGE:  01/30/2014                              OPERATIVE REPORT   PROCEDURE:  Cystoscopy with placement of left double-J stent.  PREOPERATIVE DIAGNOSIS:  Left proximal ureteral stone with obstruction.  POSTOPERATIVE DIAGNOSIS:  Left proximal ureteral stone with obstruction.  SURGEON:  Marshall Cork. Jeffie Pollock, M.D.  ANESTHESIA:  General.  SPECIMEN:  None.  DRAINS:  A 6-French 26-cm double-J stent.  COMPLICATIONS:  None.  INDICATIONS:  Kristin [name] is a 58 year old white female with a history of stones. She had lithotripsy in early October. The stone fragmented, but she was in the emergency room over the weekend with an obstructing fragment in the left proximal ureter and was seen again in the office with no movement in the stone and persistent symptoms.  After discussing the options, we elected to place a stent today.  She is on Xarelto, so I did not feel comfortable doing a formal ureteroscopy.  Once the stents in place, she will return in a week for ureteroscopic extraction of the residual stone fragments.  FINDINGS OF PROCEDURE:  She was given Cipro.  She was taken to the operating room where general anesthetic was induced.  She was placed in lithotomy position.  Her perineum and genitalia were prepped with Betadine solution.  She was draped in usual sterile fashion.  Cystoscopy was performed using a 22-French scope and 12-degree lens.  Examination revealed a normal urethra.  The external sphincter was intact.  A normal urethra and the ureteral orifices were unremarkable.  The bladder wall had mild trabeculation without tumor, stones, or inflammation.  A Sensor guidewire was then passed up the left ureter.  There was  some resistance at the level of the stone at L4, but it passed to the kidney where a coil formed. A 6-French 26-cm double-J stent was then inserted over the wire with again resistance at the level of stone, but it did the pass into the kidney.  The wire was removed leaving good coil in the kidney and a good coil in the bladder.  The bladder was drained. The patient was taken down from lithotomy position.  Her anesthetic was reversed.  She was moved to recovery room stable condition.  There were no complications.     Marshall Cork. Jeffie Pollock, M.D.     JJW/MEDQ  D:  01/30/2014  T:  01/30/2014  Job:  [id number]

## 2014-01-30 NOTE — Brief Op Note (Signed)
01/30/2014  12:15 PM  PATIENT:  Kristin [name]  58 y.o. female  PRE-OPERATIVE DIAGNOSIS:  LEFT PROXIMAL STONE  POST-OPERATIVE DIAGNOSIS:  LEFT PROXIMAL STONE  PROCEDURE:  Procedure(s): CYSTO WITH LEFT STENT INSERTION (Left)  SURGEON:  Surgeon(s) and Role:    * Malka So, MD - Primary  PHYSICIAN ASSISTANT:   ASSISTANTS: none   ANESTHESIA:   general  EBL:  Total I/O In: 600 [I.V.:600] Out: -   BLOOD ADMINISTERED:none  DRAINS: 6 x 26 left JJ stent   LOCAL MEDICATIONS USED:  NONE  SPECIMEN:  No Specimen  DISPOSITION OF SPECIMEN:  N/A  COUNTS:  YES  TOURNIQUET:  * No tourniquets in log *  DICTATION: .Other Dictation: Dictation Number [id number]  PLAN OF CARE: Discharge to home after PACU  PATIENT DISPOSITION:  PACU - hemodynamically stable.   Delay start of Pharmacological VTE agent (>24hrs) due to surgical blood loss or risk of bleeding: not applicable

## 2014-01-30 NOTE — H&P (View-Only) (Signed)
Active Problems Problems  1. Cysts of both ovaries (620.2) 2. Detrusor instability (596.59) 3. Gross hematuria (599.71) 4. Kidney stone on left side (592.0) 5. Urge and stress incontinence (788.33)  History of Present Illness Kristin [name] returns today in f/u. She had right ESWL on 6/1 and was having hematuria. She passed a fragment about a week after her last visit and the hematuria stopped. She has seen some recurrent hematuria this week but not for a couple of days. She is having no pain or voiding complaints other than stable incontinence.  She is back on Xarelto.   Past Medical History Problems  1. History of Anxiety (300.00) 2. History of Arthritis (V13.4) 3. History of Diabetic peripheral neuropathy (250.60,357.2) 4. History of anemia (V12.3) 5. History of atrial fibrillation (V12.59) 6. History of depression (V11.8) 7. History of diabetes mellitus (V12.29) 8. History of esophageal reflux (V12.79) 9. History of hypercholesterolemia (V12.29) 10. History of hypertension (V12.59) 11. History of kidney disease (V13.09) 12. History of sleep apnea (V13.89) 13. History of urinary tract infection (V13.02) 14. History of Kidney stone on right side (592.0)  Surgical History Problems  1. History of Ankle Repair 2. History of Cholecystectomy 3. History of Colostomy 4. History of Colostomy Revision 5. History of Knee Arthroscopy 6. History of Knee Surgery Right 7. History of Leg Incision  (Below Knee) 8. History of Leg Repair 9. History of Lithotripsy 10. History of Oral Surgery Tooth Extraction 11. History of Rectovag Fistula Repair - Abd Appr With Concomit Colostomy 12. History of Rectovaginal Fistula Repair - Vaginal Approach 13. History of Tonsillectomy  Current Meds 1. BuPROPion HCl ER (XL) 150 MG Oral Tablet Extended Release 24 Hour;  Therapy: 16Apr2015 to Recorded 2. Byetta 10 MCG Pen SOLN;  Therapy: (Recorded:19May2011) to Recorded 3. Excedrin Tension Headache TABS;   Therapy: (Recorded:19May2011) to Recorded 4. Irbesartan 300 MG Oral Tablet;  Therapy: 16Apr2015 to Recorded 5. Lexapro 20 MG Oral Tablet;  Therapy: (Recorded:19May2011) to Recorded 6. LORazepam 1 MG Oral Tablet;  Therapy: (Recorded:19May2011) to Recorded 7. MetFORMIN HCl ER 500 MG Oral Tablet Extended Release 24 Hour;  Therapy: (Recorded:19May2011) to Recorded 8. Metoprolol Succinate ER 100 MG Oral Tablet Extended Release 24 Hour;  Therapy: [id number] to Recorded 9. Metoprolol Succinate ER 25 MG Oral Tablet Extended Release 24 Hour;  Therapy: 07Sep2014 to Recorded 10. Metoprolol Succinate ER 50 MG Oral Tablet Extended Release 24 Hour;   Therapy: [id number] to Recorded 11. Pravastatin Sodium 40 MG Oral Tablet;   Therapy: (Recorded:19May2011) to Recorded 12. PriLOSEC 20 MG Oral Capsule Delayed Release;   Therapy: (Recorded:19May2011) to Recorded 13. Ritalin 10 MG Oral Tablet; as needed for focus;   Therapy: (Recorded:21May2015) to Recorded 14. Voltaren 1 % GEL;   Therapy: (Recorded:21May2015) to Recorded 15. Xarelto 20 MG Oral Tablet;   Therapy: [id number] to Recorded  Allergies Medication  1. Dilaudid TABS 2. tramadol 3. Clinoril TABS 4. Keflex CAPS 5. Sulfa Drugs  Family History Problems  1. Family history of Death In The Family Father   died age 77-Brain tumor 2. Family history of diabetes mellitus (V18.0) : Mother 3. Family history of kidney stones (V18.69) : Mother, Father 4. Family history of malignant neoplasm of brain (V16.8) : Father 5. Family history of Hematuria 6. Family history of Nephrolithiasis : Brother 7. Family history of Polymyalgia : Mother  Social History Problems  1. Caffeine Use   2-3 per day 2. Marital History - Single 3. Never a smoker 4. Occupation:   retired 35. Social alcohol  use 6. Denied: History of Tobacco Use  Past and social history reviewed and updated.   Review of Systems Genitourinary, constitutional, skin, eye, otolaryngeal,  hematologic/lymphatic, cardiovascular, pulmonary, endocrine, musculoskeletal, gastrointestinal, neurological and psychiatric system(s) were reviewed and pertinent findings if present are noted.  Genitourinary: incontinence and hematuria.  Gastrointestinal: no nausea and no flank pain.  Constitutional: no fever.    Vitals Vital Signs [Data Includes: Last 1 Day]  Recorded: 17Sep2015 03:05PM  Blood Pressure: 169 / 95 Temperature: 98.3 F Heart Rate: 75  Physical Exam Constitutional: Well nourished and well developed . No acute distress.  Pulmonary: No respiratory distress and normal respiratory rhythm and effort.  Cardiovascular: Heart rate and rhythm are normal . No peripheral edema.    Results/Data Urine [Data Includes: Last 1 Day]   17Sep2015  COLOR YELLOW   APPEARANCE CLOUDY   SPECIFIC GRAVITY 1.015   pH 6.0   GLUCOSE NEG mg/dL  BILIRUBIN NEG   KETONE NEG mg/dL  BLOOD LARGE   PROTEIN NEG mg/dL  UROBILINOGEN 0.2 mg/dL  NITRITE NEG   LEUKOCYTE ESTERASE NEG   SQUAMOUS EPITHELIAL/HPF FEW   WBC 0-2 WBC/hpf  RBC TNTC RBC/hpf  BACTERIA FEW   CRYSTALS NONE SEEN   CASTS NONE SEEN    The following images/tracing/specimen were independently visualized:  KUB today shows a stable LLP renal stone but no right renal or ureteral stones. She has stable vascular calfications in the pelvis. She has lumbar degenerative changes but no other abnormal findings.  The following clinical lab reports were reviewed:  UA reviewed.    Assessment Assessed  1. Gross hematuria (599.71) 2. Kidney stone on left side (592.0) 3. History of Kidney stone on right side (592.0)  She is stone free from the right ESWL but still has hematuria.   Plan Health Maintenance  1. UA With REFLEX; [Do Not Release]; Status:Resulted - Requires Verification;   Done:  [id number] 02:55PM Kidney stone on left side  2. Follow-up Schedule Surgery Office  Follow-up  Status: Hold For - Appointment   Requested for:  17Sep2015 3. KUB; Status:Resulted - Requires Verification;   Done: [id number] 12:00AM  I will get her set up for left ESWL as we discussed and will have her stop the Xarelto per Dr. Mare Ferrari.  She is aware of the risks of the procedure.

## 2014-01-30 NOTE — Anesthesia Preprocedure Evaluation (Signed)
Anesthesia Evaluation  Patient identified by MRN, date of birth, ID band Patient awake    Reviewed: Allergy & Precautions, H&P , NPO status , Patient's Chart, lab work & pertinent test results  Airway Mallampati: II  TM Distance: <3 FB Neck ROM: Full    Dental no notable dental hx.    Pulmonary sleep apnea and Continuous Positive Airway Pressure Ventilation ,  breath sounds clear to auscultation  + decreased breath sounds      Cardiovascular hypertension, Pt. on medications and Pt. on home beta blockers + dysrhythmias Atrial Fibrillation Rhythm:Regular Rate:Normal     Neuro/Psych negative neurological ROS  negative psych ROS   GI/Hepatic negative GI ROS, Neg liver ROS,   Endo/Other  negative endocrine ROSdiabetesMorbid obesity  Renal/GU negative Renal ROS  negative genitourinary   Musculoskeletal negative musculoskeletal ROS (+)   Abdominal (+) + obese,   Peds negative pediatric ROS (+)  Hematology negative hematology ROS (+)   Anesthesia Other Findings   Reproductive/Obstetrics negative OB ROS                             Anesthesia Physical Anesthesia Plan  ASA: III  Anesthesia Plan: General   Post-op Pain Management:    Induction: Intravenous  Airway Management Planned: LMA  Additional Equipment:   Intra-op Plan:   Post-operative Plan:   Informed Consent: I have reviewed the patients History and Physical, chart, labs and discussed the procedure including the risks, benefits and alternatives for the proposed anesthesia with the patient or authorized representative who has indicated his/her understanding and acceptance.   Dental advisory given  Plan Discussed with: CRNA and Surgeon  Anesthesia Plan Comments:         Anesthesia Quick Evaluation

## 2014-01-30 NOTE — Anesthesia Postprocedure Evaluation (Signed)
  Anesthesia Post-op Note  Patient: Kristin [name]  Procedure(s) Performed: Procedure(s) (LRB): CYSTO WITH LEFT STENT INSERTION (Left)  Patient Location: PACU  Anesthesia Type: General  Level of Consciousness: awake and alert   Airway and Oxygen Therapy: Patient Spontanous Breathing  Post-op Pain: mild  Post-op Assessment: Post-op Vital signs reviewed, Patient's Cardiovascular Status Stable, Respiratory Function Stable, Patent Airway and No signs of Nausea or vomiting  Last Vitals:  Filed Vitals:   01/30/14 1245  BP: 117/55  Pulse: 70  Temp:   Resp: 18    Post-op Vital Signs: stable   Complications: No apparent anesthesia complications

## 2014-01-30 NOTE — Transfer of Care (Signed)
Immediate Anesthesia Transfer of Care Note  Patient: Kristin [name]  Procedure(s) Performed: Procedure(s): CYSTO WITH LEFT STENT INSERTION (Left)  Patient Location: PACU  Anesthesia Type:General  Level of Consciousness: awake, alert  and oriented  Airway & Oxygen Therapy: Patient Spontanous Breathing and Patient connected to nasal cannula oxygen  Post-op Assessment: Report given to PACU RN  Post vital signs: Reviewed and stable  Complications: No apparent anesthesia complications

## 2014-01-31 ENCOUNTER — Other Ambulatory Visit: Payer: Self-pay | Admitting: Urology

## 2014-01-31 ENCOUNTER — Telehealth: Payer: Self-pay | Admitting: Cardiology

## 2014-01-31 ENCOUNTER — Encounter (HOSPITAL_BASED_OUTPATIENT_CLINIC_OR_DEPARTMENT_OTHER): Payer: Self-pay | Admitting: Urology

## 2014-01-31 NOTE — Telephone Encounter (Signed)
New message    Pam calling from [facility]    Need to stop xarelto due to upcoming procedure on 11/12.

## 2014-01-31 NOTE — Telephone Encounter (Signed)
Advised ok to hold according to last ov 11/28/13

## 2014-02-01 NOTE — Telephone Encounter (Signed)
Agree 

## 2014-02-05 ENCOUNTER — Encounter (HOSPITAL_BASED_OUTPATIENT_CLINIC_OR_DEPARTMENT_OTHER): Payer: Self-pay | Admitting: *Deleted

## 2014-02-05 NOTE — Progress Notes (Addendum)
NPO AFTER MN. ARRIVE AT 0715.  CURRENT LAB RESULTS AND EKG IN CHART AND EPIC. WILL TAKE METOPROLOL AND PRILOSEC AM DOS W/ SIPS OF WATER.

## 2014-02-06 NOTE — H&P (Signed)
Active Problems Problems  1. Cysts of both ovaries (620.2) 2. Detrusor instability (596.59) 3. Gross hematuria (599.71) 4. Kidney stone on left side (592.0) 5. Urge and stress incontinence (788.33)  History of Present Illness Kristin [name] returns today in f/u. She had right ESWL on 6/1 and was having hematuria. She passed a fragment about a week after her last visit and the hematuria stopped. She has seen some recurrent hematuria this week but not for a couple of days. She is having no pain or voiding complaints other than stable incontinence.  She is back on Xarelto.   Past Medical History Problems  1. History of Anxiety (300.00) 2. History of Arthritis (V13.4) 3. History of Diabetic peripheral neuropathy (250.60,357.2) 4. History of anemia (V12.3) 5. History of atrial fibrillation (V12.59) 6. History of depression (V11.8) 7. History of diabetes mellitus (V12.29) 8. History of esophageal reflux (V12.79) 9. History of hypercholesterolemia (V12.29) 10. History of hypertension (V12.59) 11. History of kidney disease (V13.09) 12. History of sleep apnea (V13.89) 13. History of urinary tract infection (V13.02) 14. History of Kidney stone on right side (592.0)  Surgical History Problems  1. History of Ankle Repair 2. History of Cholecystectomy 3. History of Colostomy 4. History of Colostomy Revision 5. History of Knee Arthroscopy 6. History of Knee Surgery Right 7. History of Leg Incision  (Below Knee) 8. History of Leg Repair 9. History of Lithotripsy 10. History of Oral Surgery Tooth Extraction 11. History of Rectovag Fistula Repair - Abd Appr With Concomit Colostomy 12. History of Rectovaginal Fistula Repair - Vaginal Approach 13. History of Tonsillectomy  Current Meds 1. BuPROPion HCl ER (XL) 150 MG Oral Tablet Extended Release 24 Hour;  Therapy: 16Apr2015 to Recorded 2. Byetta 10 MCG Pen SOLN;  Therapy: (Recorded:19May2011) to Recorded 3. Excedrin Tension Headache TABS;   Therapy: (Recorded:19May2011) to Recorded 4. Irbesartan 300 MG Oral Tablet;  Therapy: 16Apr2015 to Recorded 5. Lexapro 20 MG Oral Tablet;  Therapy: (Recorded:19May2011) to Recorded 6. LORazepam 1 MG Oral Tablet;  Therapy: (Recorded:19May2011) to Recorded 7. MetFORMIN HCl ER 500 MG Oral Tablet Extended Release 24 Hour;  Therapy: (Recorded:19May2011) to Recorded 8. Metoprolol Succinate ER 100 MG Oral Tablet Extended Release 24 Hour;  Therapy: [id number] to Recorded 9. Metoprolol Succinate ER 25 MG Oral Tablet Extended Release 24 Hour;  Therapy: 07Sep2014 to Recorded 10. Metoprolol Succinate ER 50 MG Oral Tablet Extended Release 24 Hour;   Therapy: [id number] to Recorded 11. Pravastatin Sodium 40 MG Oral Tablet;   Therapy: (Recorded:19May2011) to Recorded 12. PriLOSEC 20 MG Oral Capsule Delayed Release;   Therapy: (Recorded:19May2011) to Recorded 13. Ritalin 10 MG Oral Tablet; as needed for focus;   Therapy: (Recorded:21May2015) to Recorded 14. Voltaren 1 % GEL;   Therapy: (Recorded:21May2015) to Recorded 15. Xarelto 20 MG Oral Tablet;   Therapy: [id number] to Recorded  Allergies Medication  1. Dilaudid TABS 2. tramadol 3. Clinoril TABS 4. Keflex CAPS 5. Sulfa Drugs  Family History Problems  1. Family history of Death In The Family Father   died age 80-Brain tumor 2. Family history of diabetes mellitus (V18.0) : Mother 3. Family history of kidney stones (V18.69) : Mother, Father 4. Family history of malignant neoplasm of brain (V16.8) : Father 5. Family history of Hematuria 6. Family history of Nephrolithiasis : Brother 7. Family history of Polymyalgia : Mother  Social History Problems  1. Caffeine Use   2-3 per day 2. Marital History - Single 3. Never a smoker 4. Occupation:   retired 17. Social alcohol  use 6. Denied: History of Tobacco Use  Past and social history reviewed and updated.   Review of Systems Genitourinary, constitutional, skin, eye, otolaryngeal,  hematologic/lymphatic, cardiovascular, pulmonary, endocrine, musculoskeletal, gastrointestinal, neurological and psychiatric system(s) were reviewed and pertinent findings if present are noted.  Genitourinary: incontinence and hematuria.  Gastrointestinal: no nausea and no flank pain.  Constitutional: no fever.    Vitals Vital Signs [Data Includes: Last 1 Day]  Recorded: 17Sep2015 03:05PM  Blood Pressure: 169 / 95 Temperature: 98.3 F Heart Rate: 75  Physical Exam Constitutional: Well nourished and well developed . No acute distress.  Pulmonary: No respiratory distress and normal respiratory rhythm and effort.  Cardiovascular: Heart rate and rhythm are normal . No peripheral edema.    Results/Data Urine [Data Includes: Last 1 Day]   17Sep2015  COLOR YELLOW   APPEARANCE CLOUDY   SPECIFIC GRAVITY 1.015   pH 6.0   GLUCOSE NEG mg/dL  BILIRUBIN NEG   KETONE NEG mg/dL  BLOOD LARGE   PROTEIN NEG mg/dL  UROBILINOGEN 0.2 mg/dL  NITRITE NEG   LEUKOCYTE ESTERASE NEG   SQUAMOUS EPITHELIAL/HPF FEW   WBC 0-2 WBC/hpf  RBC TNTC RBC/hpf  BACTERIA FEW   CRYSTALS NONE SEEN   CASTS NONE SEEN    The following images/tracing/specimen were independently visualized:  KUB today shows a stable LLP renal stone but no right renal or ureteral stones. She has stable vascular calfications in the pelvis. She has lumbar degenerative changes but no other abnormal findings.  The following clinical lab reports were reviewed:  UA reviewed.    Assessment Assessed  1. Gross hematuria (599.71) 2. Kidney stone on left side (592.0) 3. History of Kidney stone on right side (592.0)  She is stone free from the right ESWL but still has hematuria.   Plan Health Maintenance  1. UA With REFLEX; [Do Not Release]; Status:Resulted - Requires Verification;   Done:  [id number] 02:55PM Kidney stone on left side  2. Follow-up Schedule Surgery Office  Follow-up  Status: Hold For - Appointment   Requested for:  17Sep2015 3. KUB; Status:Resulted - Requires Verification;   Done: [id number] 12:00AM  I will get her set up for left ESWL as we discussed and will have her stop the Xarelto per Dr. Mare Ferrari.  She is aware of the risks of the procedure.   Discussion/Summary CC: Dr. Warren Danes.    She had ESWL but got obstructive fragments and had a stent placed earlier this month.   She now is to have definitive ureteroscopy for the stones.

## 2014-02-06 NOTE — Anesthesia Preprocedure Evaluation (Addendum)
Anesthesia Evaluation  Patient identified by MRN, date of birth, ID band Patient awake    Reviewed: Allergy & Precautions, H&P , NPO status , Patient's Chart, lab work & pertinent test results, reviewed documented beta blocker date and time   History of Anesthesia Complications Negative for: history of anesthetic complications  Airway Mallampati: III  TM Distance: >3 FB Neck ROM: Full    Dental no notable dental hx. (+) Poor Dentition, Missing, Dental Advisory Given   Pulmonary sleep apnea and Continuous Positive Airway Pressure Ventilation ,  breath sounds clear to auscultation  Pulmonary exam normal       Cardiovascular hypertension, Pt. on medications and Pt. on home beta blockers + dysrhythmias Atrial Fibrillation Rhythm:Regular Rate:Normal  Patient did not take metoprolol this am as she reports that she has had severe hypotensive episodes after anesthesia in the past, will assess ability to give perioperative beta blocker    Neuro/Psych PSYCHIATRIC DISORDERS Anxiety Depression negative neurological ROS     GI/Hepatic negative GI ROS, Neg liver ROS, GERD-  Medicated and Controlled,  Endo/Other  negative endocrine ROSdiabetes, Type 2, Oral Hypoglycemic AgentsMorbid obesity  Renal/GU Renal disease  negative genitourinary   Musculoskeletal negative musculoskeletal ROS (+)   Abdominal (+) + obese,   Peds negative pediatric ROS (+)  Hematology negative hematology ROS (+)   Anesthesia Other Findings   Reproductive/Obstetrics negative OB ROS                            Anesthesia Physical Anesthesia Plan  ASA: III  Anesthesia Plan: General   Post-op Pain Management:    Induction: Intravenous  Airway Management Planned: LMA  Additional Equipment:   Intra-op Plan:   Post-operative Plan: Extubation in OR  Informed Consent: I have reviewed the patients History and Physical, chart,  labs and discussed the procedure including the risks, benefits and alternatives for the proposed anesthesia with the patient or authorized representative who has indicated his/her understanding and acceptance.   Dental advisory given  Plan Discussed with: CRNA  Anesthesia Plan Comments:         Anesthesia Quick Evaluation

## 2014-02-07 ENCOUNTER — Ambulatory Visit (HOSPITAL_BASED_OUTPATIENT_CLINIC_OR_DEPARTMENT_OTHER)
Admission: RE | Admit: 2014-02-07 | Discharge: 2014-02-07 | Disposition: A | Discharge status: Home / Self Care | Payer: Medicare Other | Source: Ambulatory Visit | Attending: Urology | Admitting: Urology

## 2014-02-07 ENCOUNTER — Encounter (HOSPITAL_BASED_OUTPATIENT_CLINIC_OR_DEPARTMENT_OTHER)
Admission: RE | Disposition: A | Discharge status: Home / Self Care | Payer: Self-pay | Source: Ambulatory Visit | Attending: Urology

## 2014-02-07 ENCOUNTER — Ambulatory Visit (HOSPITAL_BASED_OUTPATIENT_CLINIC_OR_DEPARTMENT_OTHER): Payer: Medicare Other | Admitting: Anesthesiology

## 2014-02-07 ENCOUNTER — Encounter (HOSPITAL_BASED_OUTPATIENT_CLINIC_OR_DEPARTMENT_OTHER): Payer: Self-pay | Admitting: *Deleted

## 2014-02-07 DIAGNOSIS — F329 Major depressive disorder, single episode, unspecified: Secondary | ICD-10-CM | POA: Insufficient documentation

## 2014-02-07 DIAGNOSIS — F419 Anxiety disorder, unspecified: Secondary | ICD-10-CM | POA: Insufficient documentation

## 2014-02-07 DIAGNOSIS — Z6841 Body Mass Index (BMI) 40.0 and over, adult: Secondary | ICD-10-CM | POA: Diagnosis not present

## 2014-02-07 DIAGNOSIS — N8320 Unspecified ovarian cysts: Secondary | ICD-10-CM | POA: Insufficient documentation

## 2014-02-07 DIAGNOSIS — E780 Pure hypercholesterolemia: Secondary | ICD-10-CM | POA: Insufficient documentation

## 2014-02-07 DIAGNOSIS — Z87440 Personal history of urinary (tract) infections: Secondary | ICD-10-CM | POA: Diagnosis not present

## 2014-02-07 DIAGNOSIS — M1990 Unspecified osteoarthritis, unspecified site: Secondary | ICD-10-CM | POA: Insufficient documentation

## 2014-02-07 DIAGNOSIS — Z466 Encounter for fitting and adjustment of urinary device: Secondary | ICD-10-CM | POA: Diagnosis not present

## 2014-02-07 DIAGNOSIS — I10 Essential (primary) hypertension: Secondary | ICD-10-CM | POA: Diagnosis not present

## 2014-02-07 DIAGNOSIS — Z882 Allergy status to sulfonamides status: Secondary | ICD-10-CM | POA: Diagnosis not present

## 2014-02-07 DIAGNOSIS — K219 Gastro-esophageal reflux disease without esophagitis: Secondary | ICD-10-CM | POA: Diagnosis not present

## 2014-02-07 DIAGNOSIS — E1142 Type 2 diabetes mellitus with diabetic polyneuropathy: Secondary | ICD-10-CM | POA: Diagnosis not present

## 2014-02-07 DIAGNOSIS — Z881 Allergy status to other antibiotic agents status: Secondary | ICD-10-CM | POA: Diagnosis not present

## 2014-02-07 DIAGNOSIS — N3946 Mixed incontinence: Secondary | ICD-10-CM | POA: Insufficient documentation

## 2014-02-07 DIAGNOSIS — N319 Neuromuscular dysfunction of bladder, unspecified: Secondary | ICD-10-CM | POA: Diagnosis not present

## 2014-02-07 DIAGNOSIS — N202 Calculus of kidney with calculus of ureter: Secondary | ICD-10-CM | POA: Diagnosis not present

## 2014-02-07 DIAGNOSIS — Z885 Allergy status to narcotic agent status: Secondary | ICD-10-CM | POA: Insufficient documentation

## 2014-02-07 DIAGNOSIS — G4730 Sleep apnea, unspecified: Secondary | ICD-10-CM | POA: Insufficient documentation

## 2014-02-07 DIAGNOSIS — N200 Calculus of kidney: Secondary | ICD-10-CM | POA: Diagnosis not present

## 2014-02-07 DIAGNOSIS — N201 Calculus of ureter: Secondary | ICD-10-CM | POA: Diagnosis not present

## 2014-02-07 DIAGNOSIS — I4891 Unspecified atrial fibrillation: Secondary | ICD-10-CM | POA: Insufficient documentation

## 2014-02-07 HISTORY — DX: Other specified disorders of bladder: N32.89

## 2014-02-07 HISTORY — PX: HOLMIUM LASER APPLICATION: SHX5852

## 2014-02-07 HISTORY — PX: CYSTOSCOPY WITH RETROGRADE PYELOGRAM, URETEROSCOPY AND STENT PLACEMENT: SHX5789

## 2014-02-07 LAB — GLUCOSE, CAPILLARY
Glucose-Capillary: 112 mg/dL — ABNORMAL HIGH (ref 70–99)
Glucose-Capillary: 125 mg/dL — ABNORMAL HIGH (ref 70–99)

## 2014-02-07 LAB — POCT I-STAT 4, (NA,K, GLUC, HGB,HCT)
Glucose, Bld: 100 mg/dL — ABNORMAL HIGH (ref 70–99)
HCT: 38 % (ref 36.0–46.0)
Hemoglobin: 12.9 g/dL (ref 12.0–15.0)
Potassium: 4.3 mEq/L (ref 3.7–5.3)
Sodium: 141 mEq/L (ref 137–147)

## 2014-02-07 SURGERY — CYSTOURETEROSCOPY, WITH RETROGRADE PYELOGRAM AND STENT INSERTION
Anesthesia: General | Site: Ureter

## 2014-02-07 MED ORDER — METOCLOPRAMIDE HCL 5 MG/ML IJ SOLN
INTRAMUSCULAR | Status: DC | PRN
  Administered 2014-02-07: 10 mg via INTRAVENOUS

## 2014-02-07 MED ORDER — SODIUM CHLORIDE 0.9 % IV SOLN
250.0000 mL | INTRAVENOUS | Status: DC | PRN
  Filled 2014-02-07: qty 250

## 2014-02-07 MED ORDER — LIDOCAINE HCL (CARDIAC) 20 MG/ML IV SOLN
INTRAVENOUS | Status: DC | PRN
  Administered 2014-02-07: 100 mg via INTRAVENOUS

## 2014-02-07 MED ORDER — SODIUM CHLORIDE 0.9 % IJ SOLN
3.0000 mL | Freq: Two times a day (BID) | INTRAMUSCULAR | Status: DC
  Filled 2014-02-07: qty 3

## 2014-02-07 MED ORDER — PHENAZOPYRIDINE HCL 200 MG PO TABS
200.0000 mg | ORAL_TABLET | Freq: Three times a day (TID) | ORAL | Status: DC | PRN
  Administered 2014-02-07: 200 mg via ORAL
  Filled 2014-02-07: qty 1

## 2014-02-07 MED ORDER — MIDAZOLAM HCL 5 MG/5ML IJ SOLN
INTRAMUSCULAR | Status: DC | PRN
  Administered 2014-02-07: 1 mg via INTRAVENOUS

## 2014-02-07 MED ORDER — FENTANYL CITRATE 0.05 MG/ML IJ SOLN
INTRAMUSCULAR | Status: AC
  Filled 2014-02-07: qty 4

## 2014-02-07 MED ORDER — LACTATED RINGERS IV SOLN
INTRAVENOUS | Status: DC
  Administered 2014-02-07: 09:00:00 via INTRAVENOUS
  Filled 2014-02-07: qty 1000

## 2014-02-07 MED ORDER — SODIUM CHLORIDE 0.9 % IJ SOLN
3.0000 mL | INTRAMUSCULAR | Status: DC | PRN
  Filled 2014-02-07: qty 3

## 2014-02-07 MED ORDER — ONDANSETRON HCL 4 MG/2ML IJ SOLN
INTRAMUSCULAR | Status: DC | PRN
Start: 2014-02-07 — End: 2014-02-07
  Administered 2014-02-07: 4 mg via INTRAVENOUS

## 2014-02-07 MED ORDER — ONDANSETRON HCL 4 MG/2ML IJ SOLN
4.0000 mg | Freq: Once | INTRAMUSCULAR | Status: DC | PRN
  Filled 2014-02-07: qty 2

## 2014-02-07 MED ORDER — PHENAZOPYRIDINE HCL 100 MG PO TABS
ORAL_TABLET | ORAL | Status: AC
  Filled 2014-02-07: qty 2

## 2014-02-07 MED ORDER — ACETAMINOPHEN 325 MG PO TABS
650.0000 mg | ORAL_TABLET | ORAL | Status: DC | PRN
  Filled 2014-02-07: qty 2

## 2014-02-07 MED ORDER — OXYCODONE HCL 5 MG PO TABS
ORAL_TABLET | ORAL | Status: AC
  Filled 2014-02-07: qty 1

## 2014-02-07 MED ORDER — DEXAMETHASONE SODIUM PHOSPHATE 4 MG/ML IJ SOLN
INTRAMUSCULAR | Status: DC | PRN
  Administered 2014-02-07: 10 mg via INTRAVENOUS

## 2014-02-07 MED ORDER — MIDAZOLAM HCL 2 MG/2ML IJ SOLN
INTRAMUSCULAR | Status: AC
  Filled 2014-02-07: qty 2

## 2014-02-07 MED ORDER — CIPROFLOXACIN IN D5W 400 MG/200ML IV SOLN
400.0000 mg | INTRAVENOUS | Status: AC
  Administered 2014-02-07: 400 mg via INTRAVENOUS
  Filled 2014-02-07: qty 200

## 2014-02-07 MED ORDER — PROPOFOL 10 MG/ML IV BOLUS
INTRAVENOUS | Status: DC | PRN
  Administered 2014-02-07: 50 mg via INTRAVENOUS
  Administered 2014-02-07: 150 mg via INTRAVENOUS

## 2014-02-07 MED ORDER — FENTANYL CITRATE 0.05 MG/ML IJ SOLN
25.0000 ug | INTRAMUSCULAR | Status: DC | PRN
  Filled 2014-02-07: qty 1

## 2014-02-07 MED ORDER — SODIUM CHLORIDE 0.9 % IR SOLN
Status: DC | PRN
  Administered 2014-02-07: 6000 mL

## 2014-02-07 MED ORDER — FENTANYL CITRATE 0.05 MG/ML IJ SOLN
INTRAMUSCULAR | Status: DC | PRN
  Administered 2014-02-07 (×2): 25 ug via INTRAVENOUS
  Administered 2014-02-07: 50 ug via INTRAVENOUS
  Administered 2014-02-07 (×2): 25 ug via INTRAVENOUS

## 2014-02-07 MED ORDER — ACETAMINOPHEN 650 MG RE SUPP
650.0000 mg | RECTAL | Status: DC | PRN
  Filled 2014-02-07: qty 1

## 2014-02-07 MED ORDER — EPHEDRINE SULFATE 50 MG/ML IJ SOLN
INTRAMUSCULAR | Status: DC | PRN
  Administered 2014-02-07: 10 mg via INTRAVENOUS

## 2014-02-07 MED ORDER — CIPROFLOXACIN IN D5W 400 MG/200ML IV SOLN
INTRAVENOUS | Status: AC
  Filled 2014-02-07: qty 200

## 2014-02-07 MED ORDER — OXYCODONE HCL 5 MG PO TABS
5.0000 mg | ORAL_TABLET | ORAL | Status: DC | PRN
  Administered 2014-02-07: 5 mg via ORAL
  Filled 2014-02-07: qty 2

## 2014-02-07 MED ORDER — ACETAMINOPHEN 10 MG/ML IV SOLN
INTRAVENOUS | Status: DC | PRN
  Administered 2014-02-07: 1000 mg via INTRAVENOUS

## 2014-02-07 SURGICAL SUPPLY — 39 items
BAG DRAIN URO-CYSTO SKYTR STRL (DRAIN) ×3 IMPLANT
BASKET LASER NITINOL 1.9FR (BASKET) IMPLANT
BASKET STONE 1.7 NGAGE (UROLOGICAL SUPPLIES) ×3 IMPLANT
BASKET ZERO TIP NITINOL 2.4FR (BASKET) IMPLANT
CANISTER SUCT LVC 12 LTR MEDI- (MISCELLANEOUS) ×3 IMPLANT
CATH URET 5FR 28IN CONE TIP (BALLOONS)
CATH URET 5FR 28IN OPEN ENDED (CATHETERS) ×3 IMPLANT
CATH URET 5FR 70CM CONE TIP (BALLOONS) IMPLANT
CLOTH BEACON ORANGE TIMEOUT ST (SAFETY) ×3 IMPLANT
DRAPE CAMERA CLOSED 9X96 (DRAPES) ×3 IMPLANT
ELECT REM PT RETURN 9FT ADLT (ELECTROSURGICAL)
ELECTRODE REM PT RTRN 9FT ADLT (ELECTROSURGICAL) IMPLANT
FIBER LASER FLEXIVA 1000 (UROLOGICAL SUPPLIES) IMPLANT
FIBER LASER FLEXIVA 200 (UROLOGICAL SUPPLIES) IMPLANT
FIBER LASER FLEXIVA 365 (UROLOGICAL SUPPLIES) IMPLANT
FIBER LASER FLEXIVA 550 (UROLOGICAL SUPPLIES) IMPLANT
FIBER LASER TRAC TIP (UROLOGICAL SUPPLIES) ×3 IMPLANT
GLOVE BIO SURGEON STRL SZ 6.5 (GLOVE) ×3 IMPLANT
GLOVE BIOGEL PI IND STRL 6.5 (GLOVE) ×4 IMPLANT
GLOVE BIOGEL PI INDICATOR 6.5 (GLOVE) ×2
GLOVE SURG SS PI 8.0 STRL IVOR (GLOVE) ×3 IMPLANT
GOWN PREVENTION PLUS LG XLONG (DISPOSABLE) IMPLANT
GOWN STRL REIN XL XLG (GOWN DISPOSABLE) IMPLANT
GOWN STRL REUS W/ TWL LRG LVL3 (GOWN DISPOSABLE) ×2 IMPLANT
GOWN STRL REUS W/ TWL XL LVL3 (GOWN DISPOSABLE) ×2 IMPLANT
GOWN STRL REUS W/TWL LRG LVL3 (GOWN DISPOSABLE) ×1
GOWN STRL REUS W/TWL XL LVL3 (GOWN DISPOSABLE) ×1
GUIDEWIRE 0.038 PTFE COATED (WIRE) IMPLANT
GUIDEWIRE ANG ZIPWIRE 038X150 (WIRE) IMPLANT
GUIDEWIRE STR DUAL SENSOR (WIRE) ×3 IMPLANT
IV NS IRRIG 3000ML ARTHROMATIC (IV SOLUTION) ×6 IMPLANT
KIT BALLIN UROMAX 15FX10 (LABEL) IMPLANT
KIT BALLN UROMAX 15FX4 (MISCELLANEOUS) IMPLANT
KIT BALLN UROMAX 26 75X4 (MISCELLANEOUS)
PACK CYSTO (CUSTOM PROCEDURE TRAY) ×3 IMPLANT
SET HIGH PRES BAL DIL (LABEL)
SHEATH ACCESS URETERAL 38CM (SHEATH) ×3 IMPLANT
SHEATH ACCESS URETERAL 54CM (SHEATH) IMPLANT
STENT URET 6FRX26 CONTOUR (STENTS) ×3 IMPLANT

## 2014-02-07 NOTE — Interval H&P Note (Signed)
History and Physical Interval Note:  02/07/2014 8:24 AM  Kristin [name]  has presented today for surgery, with the diagnosis of LEFT URETERAL/RENAL STONES  The various methods of treatment have been discussed with the patient and family. After consideration of risks, benefits and other options for treatment, the patient has consented to  Procedure(s): LEFT URETEROSCOPY AND STENT PLACEMENT (Left) HOLMIUM LASER APPLICATION (N/A) as a surgical intervention .  The patient's history has been reviewed, patient examined, no change in status, stable for surgery.  I have reviewed the patient's chart and labs.  Questions were answered to the patient's satisfaction.     Michae Grimley J

## 2014-02-07 NOTE — Op Note (Signed)
NAMEMarland Kitchen  Kristin [name], Kristin [name] NO.:  [id number]  MEDICAL RECORD NO.:  [id number]  LOCATION:                               FACILITY:  [facility]  PHYSICIAN:  Marshall Cork. Jeffie Pollock, M.D.    DATE OF BIRTH:  [birth year]  DATE OF PROCEDURE:  02/07/2014 DATE OF DISCHARGE:  02/07/2014                              OPERATIVE REPORT   PROCEDURE:  Cystoscopy with removal of left double-J stent, left ureteroscopy with holmium laser, and stone extraction with placement of double-J stent.  PREOPERATIVE DIAGNOSIS:  Left ureteral and renal stones.  POSTOPERATIVE DIAGNOSIS:  Left ureteral and renal stones.  SURGEON:  Marshall Cork. Jeffie Pollock, MD  ANESTHESIA:  General.  SPECIMEN:  Stone fragments.  DRAINS:  A 6-French 26-cm double-J stent with string.  COMPLICATIONS:  None.  INDICATIONS:  Kristin [name] is a 58 year old white female with history of stones and lithotripsy a few weeks ago.  The stone partially fragmented but she developed obstructing fragments requiring placement of a stent. She returns now for definitive management of her stone fragments.  FINDINGS OF PROCEDURE:  She was given Cipro and taken to the operating room where general anesthetic was induced.  She was placed in lithotomy position.  Her perineum and genitalia were prepped with Betadine solution.  She was draped in usual sterile fashion.  A cystoscopy was performed using a 22-French scope and 12-degree lens. The stent was identified at the left ureteral orifice.  It was partially encrusted and there was edema around the meatus.  No other abnormalities were noted in the bladder.  The stent was grasped with grasping forceps and pulled through the urethral meatus where a guidewire was passed through the kidney and the stent was removed.  A 12 x 14 x 38 cm digital access sheath was placed over the wire to the kidney.  The wire and the coil were removed.  Digital flexible scope was then passed to the kidney.  The stone fragments were  identified in the lower pole.  The bulk of them were removed with an NGage basket.  I then had 1 large residual fragment that would not come through intact.  This was engaged with a 200 micron holmium laser fiber set on 1 watt and 20 hertz.  The stone fragmented sufficiently, and the fragments were then removed with the NGage basket.  I then reinserted the scope and additional small stone was identified in the upper pole calyx, this was removed.  Finally, the sheath and scope removed in tandem, and a large residual stone was noted in the lower proximal ureter.  This was grasped with the NGage basket and removed intact.  I then reinserted a guidewire to the kidney.  As there was some bleeding and some ureteral irritation, I felt a stent was indicated.  A guidewire was reinserted to the kidney and a 6-French 26 cm double-J stent with string was placed without difficulty under fluoroscopic guidance.  The wire was removed leaving good coil in the kidney and good coil in the bladder.  The bladder was drained.  The cystoscope was removed.  The patient was taken down from lithotomy position.  Her anesthetic was reversed.  She was moved to recovery room in stable condition.  The stone fragments were given to the patient's family to bring to the office.     Marshall Cork. Jeffie Pollock, M.D.     JJW/MEDQ  D:  02/07/2014  T:  02/07/2014  Job:  [id number]

## 2014-02-07 NOTE — Brief Op Note (Signed)
02/07/2014  11:17 AM  PATIENT:  Kristin [name]  58 y.o. female  PRE-OPERATIVE DIAGNOSIS:  LEFT URETERAL/RENAL STONES  POST-OPERATIVE DIAGNOSIS:  LEFT URETERAL/RENAL STONES  PROCEDURE:  Procedure(s): CYSTOSCOPY, LEFT URETEROSCOPY, HOLMIUM LASER, STONE EXTRACTION, AND STENT PLACEMENT (Left) HOLMIUM LASER APPLICATION (N/A)  SURGEON:  Surgeon(s) and Role:    * Malka So, MD - Primary  PHYSICIAN ASSISTANT:   ASSISTANTS: none   ANESTHESIA:   general  EBL:  Total I/O In: 300 [I.V.:300] Out: -   BLOOD ADMINISTERED:none  DRAINS: 6 x 26 JJ stent   LOCAL MEDICATIONS USED:  NONE  SPECIMEN:  Source of Specimen:  stone fragments  DISPOSITION OF SPECIMEN:  to patient  COUNTS:  YES  TOURNIQUET:  * No tourniquets in log *  DICTATION: .Other Dictation: Dictation Number [id number]  PLAN OF CARE: Discharge to home after PACU  PATIENT DISPOSITION:  PACU - hemodynamically stable.   Delay start of Pharmacological VTE agent (>24hrs) due to surgical blood loss or risk of bleeding: not applicable

## 2014-02-07 NOTE — Anesthesia Procedure Notes (Signed)
Procedure Name: LMA Insertion Date/Time: 02/07/2014 12:32 PM Performed by: Mechele Claude Pre-anesthesia Checklist: Patient identified, Emergency Drugs available, Suction available and Patient being monitored Patient Re-evaluated:Patient Re-evaluated prior to inductionOxygen Delivery Method: Circle System Utilized Preoxygenation: Pre-oxygenation with 100% oxygen Intubation Type: IV induction Ventilation: Mask ventilation without difficulty LMA: LMA with gastric port inserted LMA Size: 5.0 Number of attempts: 1 Placement Confirmation: positive ETCO2 Tube secured with: Tape Dental Injury: Teeth and Oropharynx as per pre-operative assessment

## 2014-02-07 NOTE — Transfer of Care (Signed)
Immediate Anesthesia Transfer of Care Note  Patient: Kristin [name]  Procedure(s) Performed: Procedure(s) (LRB): CYSTOSCOPY, LEFT URETEROSCOPY, HOLMIUM LASER, STONE EXTRACTION, AND STENT PLACEMENT (Left) HOLMIUM LASER APPLICATION (N/A)  Patient Location: PACU  Anesthesia Type: General  Level of Consciousness: awake, alert  and oriented  Airway & Oxygen Therapy: Patient Spontanous Breathing and Patient connected to face mask oxygen  Post-op Assessment: Report given to PACU RN and Post -op Vital signs reviewed and stable  Post vital signs: Reviewed and stable  Complications: No apparent anesthesia complications

## 2014-02-07 NOTE — Discharge Instructions (Addendum)
Ureteral Stent Implantation Ureteral stent implantation is the implantation of a soft plastic tube with multiple holes into the tube that drains urine from your kidney to your bladder (ureter). The stent helps drain your kidney when there is a blockage of the flow of urine in your ureter. The stent has a coil on each end to keep it from falling out. One end stays in the kidney. The other end stays in the bladder. It is most often taken out after any blockage has been removed or your ureter has healed. Short-term stents have a string attached to make removal quite easy. Removal of a short-term stent can be done in your health care provider's office or by you at home. Long-term stents need to be changed every few months. LET [facility] CARE PROVIDER KNOW ABOUT: 1. Any allergies you have. 2. All medicines you are taking, including vitamins, herbs, eye drops, creams, and over-the-counter medicines. 3. Previous problems you or members of your family have had with the use of anesthetics. 4. Any blood disorders you have. 5. Previous surgeries you have had. 6. Medical conditions you have. RISKS AND COMPLICATIONS Generally, ureteral stent implantation is a safe procedure. However, as with any procedure, complications can occur. Possible complications include:  Movement of the stent away from where it was originally placed (migration). This may affect the ability of the stent to properly drain your kidney. If migration of the stent occurs, the stent may need to be replaced or repositioned.  Perforation of the ureter.  Infection. BEFORE THE PROCEDURE  You may be asked to wash your genital area with sterile soap the morning of your procedure.  You may be given an oral antibiotic which you should take with a sip of water as prescribed by your health care provider.  You may be asked to not eat or drink for 8 hours before the surgery. PROCEDURE  First you will be given an anesthetic so you do not feel  pain during the procedure.  Your health care provider will insert a special lighted instrument called a cystoscope into your bladder. This allows your health care provider to see the opening to your ureter.  A thin wire is carefully threaded into your bladder and up the ureter. The stent is inserted over the wire and the wire is then removed.  Your bladder will be emptied of urine. AFTER THE PROCEDURE You will be taken to a recovery room until it is okay for you to go home. Document Released: 03/12/2000 Document Revised: 03/20/2013 Document Reviewed: 08/22/2012 [facility] Patient Information 2015 [place], [place]. This information is not intended to replace advice given to you by your health care provider. Make sure you discuss any questions you have with your health care provider.   Please pull the stent by the attached string on Monday morning.   If you don't feel comfortable with this come to the office.  Post Anesthesia Home Care Instructions  Activity: Get plenty of rest for the remainder of the day. A responsible adult should stay with you for 24 hours following the procedure.  For the next 24 hours, DO NOT: -Drive a car -Paediatric nurse -Drink alcoholic beverages -Take any medication unless instructed by your physician -Make any legal decisions or sign important papers.  Meals: Start with liquid foods such as gelatin or soup. Progress to regular foods as tolerated. Avoid greasy, spicy, heavy foods. If nausea and/or vomiting occur, drink only clear liquids until the nausea and/or vomiting subsides. Call your physician  if vomiting continues.  Special Instructions/Symptoms: Your throat may feel dry or sore from the anesthesia or the breathing tube placed in your throat during surgery. If this causes discomfort, gargle with warm salt water. The discomfort should disappear within 24 hours. [facility] [phone] Post Ureteroscopy With or Without Stent  Instructions  Definitions:  Ureter: The duct that transports urine from the kidney to the bladder. Stent:   A plastic hollow tube that is placed into the ureter, from the kidney to the                 bladder to prevent the ureter from swelling shut.  GENERAL INSTRUCTIONS:  Despite the fact that no skin incisions were used, the area around the ureter and bladder is raw and irritated. The stent is a foreign body which will further irritate the bladder wall. This irritation is manifested by increased frequency of urination, both day and night, and by an increase in the urge to urinate. In some, the urge to urinate is present almost always. Sometimes the urge is strong enough that you may not be able to stop yourself from urinating. The only real cure is to remove the stent and then give time for the bladder wall to heal which can't be done until the danger of the ureter swelling shut has passed, which varies.  You may see some blood in your urine while the stent is in place and a few days afterwards. Do not be alarmed, even if the urine was clear for a while. Get off your feet and drink lots of fluids until clearing occurs. If you start to pass clots or don't improve, call us.  DIET: You may return to your normal diet immediately. Because of the raw surface of your bladder, alcohol, spicy foods, acid type foods and drinks with caffeine may cause irritation or frequency and should be used in moderation. To keep your urine flowing freely and to avoid constipation, drink plenty of fluids during the day ( 8-10 glasses ). Tip: Avoid cranberry juice because it is very acidic.  ACTIVITY: Your physical activity doesn't need to be restricted. However, if you are very active, you may see some blood in your urine. We suggest that you reduce your activity under these circumstances until the bleeding has stopped.  BOWELS: It is important to keep your bowels regular during the postoperative period. Straining  with bowel movements can cause bleeding. A bowel movement every other day is reasonable. Use a mild laxative if needed, such as Milk of Magnesia 2-3 tablespoons, or 2 Dulcolax tablets. Call if you continue to have problems. If you have been taking narcotics for pain, before, during or after your surgery, you may be constipated. Take a laxative if necessary.   MEDICATION: You should resume your pre-surgery medications unless told not to. In addition you will often be given an antibiotic to prevent infection. These should be taken as prescribed until the bottles are finished unless you are having an unusual reaction to one of the drugs.  PROBLEMS YOU SHOULD REPORT TO [place]:  Fevers over 100.5 Fahrenheit.  Heavy bleeding, or clots ( See above notes about blood in urine ).  Inability to urinate.  Drug reactions ( hives, rash, nausea, vomiting, diarrhea ).  Severe burning or pain with urination that is not improving.  FOLLOW-UP: You will need a follow-up appointment to monitor your progress. Call for this appointment at the number listed above. Usually the first appointment will be about  three to fourteen days after your surgery.

## 2014-02-08 NOTE — Anesthesia Postprocedure Evaluation (Signed)
  Anesthesia Post-op Note  Patient: Kristin [name]  Procedure(s) Performed: Procedure(s) (LRB): CYSTOSCOPY, LEFT URETEROSCOPY, HOLMIUM LASER, STONE EXTRACTION, AND STENT PLACEMENT (Left) HOLMIUM LASER APPLICATION (N/A)  Patient Location: PACU  Anesthesia Type: General  Level of Consciousness: awake and alert   Airway and Oxygen Therapy: Patient Spontanous Breathing  Post-op Pain: mild  Post-op Assessment: Post-op Vital signs reviewed, Patient's Cardiovascular Status Stable, Respiratory Function Stable, Patent Airway and No signs of Nausea or vomiting  Last Vitals:  Filed Vitals:   02/07/14 1230  BP: 131/76  Pulse: 70  Temp: 36.4 C  Resp: 14    Post-op Vital Signs: stable   Complications: No apparent anesthesia complications

## 2014-02-11 ENCOUNTER — Encounter (HOSPITAL_BASED_OUTPATIENT_CLINIC_OR_DEPARTMENT_OTHER): Payer: Self-pay | Admitting: Urology

## 2014-02-14 DIAGNOSIS — N200 Calculus of kidney: Secondary | ICD-10-CM | POA: Diagnosis not present

## 2014-04-08 ENCOUNTER — Other Ambulatory Visit: Payer: Self-pay | Admitting: Cardiology

## 2014-05-29 DIAGNOSIS — N3281 Overactive bladder: Secondary | ICD-10-CM | POA: Diagnosis not present

## 2014-05-29 DIAGNOSIS — N3946 Mixed incontinence: Secondary | ICD-10-CM | POA: Diagnosis not present

## 2014-05-29 DIAGNOSIS — N200 Calculus of kidney: Secondary | ICD-10-CM | POA: Diagnosis not present

## 2014-05-31 DIAGNOSIS — H43812 Vitreous degeneration, left eye: Secondary | ICD-10-CM | POA: Diagnosis not present

## 2014-07-01 DIAGNOSIS — H43812 Vitreous degeneration, left eye: Secondary | ICD-10-CM | POA: Diagnosis not present

## 2014-07-11 DIAGNOSIS — Z6841 Body Mass Index (BMI) 40.0 and over, adult: Secondary | ICD-10-CM | POA: Diagnosis not present

## 2014-07-11 DIAGNOSIS — F324 Major depressive disorder, single episode, in partial remission: Secondary | ICD-10-CM | POA: Diagnosis not present

## 2014-07-11 DIAGNOSIS — I480 Paroxysmal atrial fibrillation: Secondary | ICD-10-CM | POA: Diagnosis not present

## 2014-07-11 DIAGNOSIS — G4730 Sleep apnea, unspecified: Secondary | ICD-10-CM | POA: Diagnosis not present

## 2014-07-11 DIAGNOSIS — R29818 Other symptoms and signs involving the nervous system: Secondary | ICD-10-CM | POA: Diagnosis not present

## 2014-07-11 DIAGNOSIS — E785 Hyperlipidemia, unspecified: Secondary | ICD-10-CM | POA: Diagnosis not present

## 2014-07-11 DIAGNOSIS — M2550 Pain in unspecified joint: Secondary | ICD-10-CM | POA: Diagnosis not present

## 2014-07-11 DIAGNOSIS — E1121 Type 2 diabetes mellitus with diabetic nephropathy: Secondary | ICD-10-CM | POA: Diagnosis not present

## 2014-07-11 DIAGNOSIS — E6609 Other obesity due to excess calories: Secondary | ICD-10-CM | POA: Diagnosis not present

## 2014-07-11 DIAGNOSIS — D509 Iron deficiency anemia, unspecified: Secondary | ICD-10-CM | POA: Diagnosis not present

## 2014-07-11 DIAGNOSIS — N181 Chronic kidney disease, stage 1: Secondary | ICD-10-CM | POA: Diagnosis not present

## 2014-07-11 DIAGNOSIS — I129 Hypertensive chronic kidney disease with stage 1 through stage 4 chronic kidney disease, or unspecified chronic kidney disease: Secondary | ICD-10-CM | POA: Diagnosis not present

## 2014-07-23 DIAGNOSIS — N3281 Overactive bladder: Secondary | ICD-10-CM | POA: Diagnosis not present

## 2014-08-05 DIAGNOSIS — M79671 Pain in right foot: Secondary | ICD-10-CM | POA: Diagnosis not present

## 2014-08-05 DIAGNOSIS — M25571 Pain in right ankle and joints of right foot: Secondary | ICD-10-CM | POA: Diagnosis not present

## 2014-08-06 ENCOUNTER — Ambulatory Visit: Payer: Medicare Other | Attending: Family Medicine

## 2014-08-06 DIAGNOSIS — R29898 Other symptoms and signs involving the musculoskeletal system: Secondary | ICD-10-CM | POA: Diagnosis not present

## 2014-08-06 DIAGNOSIS — R262 Difficulty in walking, not elsewhere classified: Secondary | ICD-10-CM | POA: Insufficient documentation

## 2014-08-06 DIAGNOSIS — R279 Unspecified lack of coordination: Secondary | ICD-10-CM | POA: Diagnosis not present
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
## 2014-08-07 ENCOUNTER — Ambulatory Visit: Payer: Medicare Other

## 2014-08-07 DIAGNOSIS — R279 Unspecified lack of coordination: Secondary | ICD-10-CM

## 2014-08-07 DIAGNOSIS — R29898 Other symptoms and signs involving the musculoskeletal system: Secondary | ICD-10-CM | POA: Diagnosis not present

## 2014-08-07 DIAGNOSIS — R262 Difficulty in walking, not elsewhere classified: Secondary | ICD-10-CM | POA: Diagnosis not present

## 2014-08-07 NOTE — Patient Instructions (Signed)
   Feet Apart (Compliant Surface) Head Motion - Eyes Closed   Stand with your back to a corner on compliant surface: two stacked pillows with feet shoulder width apart. Close eyes and move head slowly, up and down 10x, then horizontal 10x, then diagonal each direction 10x. Hold onto a chair in front of you for support as needed, eventually progressing to not holding on.  Copyright  VHI. All rights reserved.    Alternating Step Up   Stand facing step. Hold rail. Step up leading with right leg. Step back down leading with left leg. Perform 10x, then switch legs. Copyright  VHI. All rights reserved.     Weight Shift: Anterior / Posterior (Limits of Stability)   Slowly shift weight backward until toes begin to rise off floor. Return to starting position. Shift weight slowly forward until heels begin to rise off floor. Hold each position 1-2 seconds. Perform within pain free range. If this becomes painful, decrease the amount of motion you are doing. Hold counter as needed for balance. Copyright  VHI. All rights reserved.    Backward Walk   Hold counter if needed. Step backward with one leg. Strike ground with front of foot. Rolling back onto foot, bring other leg backward. Session: do 3 laps at counter daily Copyright  VHI. All rights reserved.     "I love a Psychiatrist as needed. March forward along counter with two second holds. When you get to the end, march backward in the same manner. http://gt2.exer.us/345   Copyright  VHI. All rights reserved.    Functional Quadriceps: Sit to Stand   Scoot to edge of chair (one hip at a time--don't press back into chair), move feet so they are behind you knees. Lean forward and feel the weight shift to your toes. Stand upright, extending knees fully. Perform 10x daily without using your hands. http://orth.exer.us/735   Copyright  VHI. All rights reserved.
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
## 2014-08-08 DIAGNOSIS — D509 Iron deficiency anemia, unspecified: Secondary | ICD-10-CM | POA: Diagnosis not present

## 2014-08-08 DIAGNOSIS — K219 Gastro-esophageal reflux disease without esophagitis: Secondary | ICD-10-CM | POA: Diagnosis not present

## 2014-08-13 DIAGNOSIS — D509 Iron deficiency anemia, unspecified: Secondary | ICD-10-CM | POA: Diagnosis not present

## 2014-08-16 ENCOUNTER — Ambulatory Visit: Payer: Medicare Other

## 2014-08-16 DIAGNOSIS — R262 Difficulty in walking, not elsewhere classified: Secondary | ICD-10-CM

## 2014-08-16 DIAGNOSIS — R279 Unspecified lack of coordination: Secondary | ICD-10-CM | POA: Diagnosis not present

## 2014-08-16 DIAGNOSIS — R29898 Other symptoms and signs involving the musculoskeletal system: Secondary | ICD-10-CM | POA: Diagnosis not present
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
## 2014-08-16 NOTE — Patient Instructions (Signed)
Feet Apart (Compliant Surface) Head Motion - Eyes Closed   Stand with your back to a corner on compliant surface: two stacked pillows with feet shoulder width apart. Close eyes and move head slowly, up and down 10x, then horizontal 10x, then diagonal each direction 10x. Hold onto a chair in front of you for support as needed, eventually progressing to not holding on.  Copyright  VHI. All rights reserved.    Alternating Step Up   Stand facing step. Hold rail. Step up leading with right leg. Step back down leading with left leg. Perform 10x, then switch legs. Copyright  VHI. All rights reserved.     Weight Shift: Anterior / Posterior (Limits of Stability)   Slowly shift weight backward until toes begin to rise off floor. Return to starting position. Shift weight slowly forward until heels begin to rise off floor. Hold each position 1-2 seconds. Perform within pain free range. If this becomes painful, decrease the amount of motion you are doing. Hold counter as needed for balance. Copyright  VHI. All rights reserved.    Backward Walk   Hold counter if needed. Step backward with one leg. Strike ground with front of foot. Rolling back onto foot, bring other leg backward. Session: do 3 laps at counter daily Copyright  VHI. All rights reserved.     "I love a Psychiatrist as needed. March forward along counter with two second holds. When you get to the end, march backward in the same manner. http://gt2.exer.us/345   Copyright  VHI. All rights reserved.    Functional Quadriceps: Sit to Stand   Scoot to edge of chair (one hip at a time--don't press back into chair), move feet so they are behind you knees. Lean forward and feel the weight shift to your toes. Stand upright, extending knees fully. Perform 10x daily without using your hands. http://orth.exer.us/735   Copyright  VHI. All rights reserved.             Posture: Ideal Posture   Use with  list on 3 (1 of 2) Ideal Posture Use with figures on 3 (2 of 2): 1.Head erect 2.Chin in 3.Chest and navel aligned 4.Spinal curves maintained 5.Knees relaxed 6.Shoulders and hips aligned 7.Feet slightly apart 8.Toes and arches active 9.Abdomen taut (breathe with diaphragm) 10.Arms at sides Ideal posture is: -pain free. -achieved with practice, mindful interest, and body awareness.  Copyright  VHI. All rights reserved.     Copyright  VHI. All rights reserved.

## 2014-08-19 ENCOUNTER — Telehealth: Payer: Self-pay | Admitting: Neurology

## 2014-08-19 NOTE — Telephone Encounter (Signed)
Spoke with Kristin [name] at Dr. Lorie Apley office. Informed her that we have not seen the pt since 2013. We do not have a recent AHI score, but her records show from 2013 that her AHI was 1.9 on a cpap compliance report. Kristin [name] verbalized understanding.

## 2014-08-19 NOTE — Telephone Encounter (Signed)
Rec'd records on [place], but needs the AHI score from her sleep study.  Caryl Pina from Dr. Youlanda Mighty office.  [phone]

## 2014-08-20 ENCOUNTER — Ambulatory Visit: Payer: Medicare Other

## 2014-08-20 DIAGNOSIS — R262 Difficulty in walking, not elsewhere classified: Secondary | ICD-10-CM | POA: Diagnosis not present

## 2014-08-20 DIAGNOSIS — R29898 Other symptoms and signs involving the musculoskeletal system: Secondary | ICD-10-CM | POA: Diagnosis not present

## 2014-08-20 DIAGNOSIS — R279 Unspecified lack of coordination: Secondary | ICD-10-CM | POA: Diagnosis not present
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
## 2014-08-22 ENCOUNTER — Ambulatory Visit: Payer: Medicare Other

## 2014-08-22 DIAGNOSIS — R262 Difficulty in walking, not elsewhere classified: Secondary | ICD-10-CM | POA: Diagnosis not present

## 2014-08-22 DIAGNOSIS — R29898 Other symptoms and signs involving the musculoskeletal system: Secondary | ICD-10-CM

## 2014-08-22 DIAGNOSIS — R279 Unspecified lack of coordination: Secondary | ICD-10-CM | POA: Diagnosis not present
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
## 2014-08-28 ENCOUNTER — Ambulatory Visit: Payer: Medicare Other | Attending: Family Medicine

## 2014-08-28 DIAGNOSIS — R279 Unspecified lack of coordination: Secondary | ICD-10-CM | POA: Diagnosis not present

## 2014-08-28 DIAGNOSIS — R29898 Other symptoms and signs involving the musculoskeletal system: Secondary | ICD-10-CM

## 2014-08-28 DIAGNOSIS — R262 Difficulty in walking, not elsewhere classified: Secondary | ICD-10-CM | POA: Insufficient documentation
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
## 2014-09-02 DIAGNOSIS — K219 Gastro-esophageal reflux disease without esophagitis: Secondary | ICD-10-CM | POA: Diagnosis not present

## 2014-09-02 DIAGNOSIS — Z1211 Encounter for screening for malignant neoplasm of colon: Secondary | ICD-10-CM | POA: Diagnosis not present

## 2014-09-02 DIAGNOSIS — D509 Iron deficiency anemia, unspecified: Secondary | ICD-10-CM | POA: Diagnosis not present

## 2014-09-02 DIAGNOSIS — K317 Polyp of stomach and duodenum: Secondary | ICD-10-CM | POA: Diagnosis not present

## 2014-09-03 ENCOUNTER — Ambulatory Visit: Payer: Medicare Other

## 2014-09-06 ENCOUNTER — Ambulatory Visit: Payer: Medicare Other

## 2014-09-06 DIAGNOSIS — R262 Difficulty in walking, not elsewhere classified: Secondary | ICD-10-CM | POA: Diagnosis not present

## 2014-09-06 DIAGNOSIS — R279 Unspecified lack of coordination: Secondary | ICD-10-CM

## 2014-09-06 DIAGNOSIS — R29898 Other symptoms and signs involving the musculoskeletal system: Secondary | ICD-10-CM | POA: Diagnosis not present
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
## 2014-09-10 ENCOUNTER — Ambulatory Visit: Payer: Medicare Other

## 2014-09-13 ENCOUNTER — Ambulatory Visit: Payer: Medicare Other

## 2014-09-13 DIAGNOSIS — R29898 Other symptoms and signs involving the musculoskeletal system: Secondary | ICD-10-CM

## 2014-09-13 DIAGNOSIS — R279 Unspecified lack of coordination: Secondary | ICD-10-CM | POA: Diagnosis not present

## 2014-09-13 DIAGNOSIS — R262 Difficulty in walking, not elsewhere classified: Secondary | ICD-10-CM | POA: Diagnosis not present
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
## 2014-09-13 NOTE — Patient Instructions (Addendum)
Feet Apart (Compliant Surface) Head Motion - Eyes Closed   Stand with your back to a corner on compliant surface: two stacked pillows with feet shoulder width apart. Close eyes and move head slowly, up and down 10x, then horizontal 10x, then diagonal each direction 10x. Hold onto a chair in front of you for support as needed, eventually progressing to not holding on.  Step on couch cushion (place towel or pillow case to protect pillows/cushion). Copyright  VHI. All rights reserved.    Alternating Step Up   Stand facing step. Hold rail. Step up leading with right leg. Step back down leading with left leg. Perform 10x, then switch legs. Increase to 2 sets of 10 reps. Copyright  VHI. All rights reserved.     Backward Walk   Hold counter if needed. Step backward with one leg. Strike ground with front of foot. Rolling back onto foot, bring other leg backward. Session: do 3 laps at counter daily Copyright  VHI. All rights reserved.     "I love a Psychiatrist as needed. March forward along counter with two second holds. When you get to the end, march backward in the same manner. http://gt2.exer.CH/885  Hold onto counter as needed (to reduce knee pain). Also, make sure to slowly lower your leg in order to reduce knee pain.  Copyright  VHI. All rights reserved.    Functional Quadriceps: Sit to Stand   Scoot to edge of chair (one hip at a time--don't press back into chair), move feet so they are behind you knees. Lean forward and feel the weight shift to your toes. Stand upright, extending knees fully. Perform 10x daily without using your hands. Try sit<>stand transfer from a lower surface (and/or compliant-squishy) to progress exercise. http://orth.exer.us/735   Copyright  VHI. All rights reserved.   Posture: Ideal Posture   Use with list on 3 (1 of 2) Ideal Posture Use with figures on 3 (2 of 2): 1.Head erect 2.Chin in 3.Chest and navel aligned 4.Spinal  curves maintained 5.Knees relaxed 6.Shoulders and hips aligned 7.Feet slightly apart 8.Toes and arches active 9.Abdomen taut (breathe with diaphragm) 10.Arms at sides Ideal posture is: -pain free. -achieved with practice, mindful interest, and body awareness.  Copyright  VHI. All rights reserved.     Achilles / Gastroc, Standing   Stand and hold onto counter, left foot behind, heel on floor and turned slightly out, leg straight, forward leg bent. Move hips forward. Hold _30__ seconds. Repeat _3__ times per session. Do __2-3_ sessions per day.  Copyright  VHI. All rights reserved.

## 2014-09-25 DIAGNOSIS — R51 Headache: Secondary | ICD-10-CM | POA: Diagnosis not present

## 2014-09-25 DIAGNOSIS — I10 Essential (primary) hypertension: Secondary | ICD-10-CM | POA: Diagnosis not present

## 2014-09-25 DIAGNOSIS — D509 Iron deficiency anemia, unspecified: Secondary | ICD-10-CM | POA: Diagnosis not present

## 2014-09-25 DIAGNOSIS — R079 Chest pain, unspecified: Secondary | ICD-10-CM | POA: Diagnosis not present

## 2014-10-10 NOTE — Therapy (Signed)
[facility] [address] [place], [place], [address] Phone: [phone]   Fax:  [phone]  Patient Details  Name: Kristin [name] MRN: [id number] Date of Birth: 10/03/1955 Referring Provider:  No ref. provider found  Encounter Date: 10/10/2014  PHYSICAL THERAPY DISCHARGE SUMMARY  Visits from Start of Care: 8  Current functional level related to goals / functional outcomes:     PT Short Term Goals - 09/13/14 1617    PT SHORT TERM GOAL #1   Title Demonstrate correct performance of HEP to address balance impairment. Target: 09/06/14   Status Achieved   PT SHORT TERM GOAL #2   Title Increase DGI score to 19/24 for improving dynamic balance. Target: 09/06/14   Status Achieved   PT SHORT TERM GOAL #3   Title Decrease time for 5x sit to stand without retropulsion (with chair away from wall) to 12.5 seconds for decrasing debility. Target: 09/06/14   Status Achieved   PT SHORT TERM GOAL #4   Title Demonstrate ability to turn to look over shoulder (as in [place]) without loss of balance. Target: 09/06/14   Status Achieved   PT SHORT TERM GOAL #5   Title [place] ability to negotiate stairs with step over step pattern without loss of balance for increased community ambulation. Target: 09/06/14   Status Achieved         PT Long Term Goals - 08/16/14 1636    PT LONG TERM GOAL #1   Title Pt will verbalize understanding of fall prevention education in home environment. Target: 10/05/2014   Status On-going   PT LONG TERM GOAL #2   Title Decrase 5x sit to stand without retropulsion without UE push off to <11.5 seconds for decreaed debility. Target: 10/05/2014   Status On-going   PT LONG TERM GOAL #3   Title Demonstrate ability to negotiate curb independently for increased community access. Target: 10/05/2014   Status On-going   PT LONG TERM GOAL #4   Title Increase DGI to 21/24 for decreased fall risk. Target: 10/05/2014   Status  On-going   PT LONG TERM GOAL #5   Title Increase FOTO activities balance confidence score to 30% for incrased balance confidence. Target: 10/05/2014   Status On-going        Remaining deficits: Unknown, as pt cancelled next appointment and then never returned to PT. [facility] desk tried to call pt multiple times over the last month and was unable to speak with pt or leave a message. Therefore, PT is discharging pt from PT.   Education / Equipment: HEP  Plan: Patient agrees to discharge.  Patient goals were partially met. Patient is being discharged due to not returning since the last visit.  ?????       [place] Chelf L 10/10/2014, 12:05 PM  [facility] [address] [place], [place], [address] Phone: [phone]   Fax:  [phone]   Geoffry Paradise, PT,DPT 10/10/2014 12:06 PM Phone: [phone] Fax: [phone]

## 2014-10-15 ENCOUNTER — Other Ambulatory Visit: Payer: Self-pay | Admitting: Cardiology

## 2014-10-22 DIAGNOSIS — N3281 Overactive bladder: Secondary | ICD-10-CM | POA: Diagnosis not present

## 2014-10-22 DIAGNOSIS — N3946 Mixed incontinence: Secondary | ICD-10-CM | POA: Diagnosis not present

## 2014-10-24 DIAGNOSIS — D509 Iron deficiency anemia, unspecified: Secondary | ICD-10-CM | POA: Diagnosis not present

## 2014-10-24 DIAGNOSIS — K219 Gastro-esophageal reflux disease without esophagitis: Secondary | ICD-10-CM | POA: Diagnosis not present

## 2014-11-04 ENCOUNTER — Encounter: Payer: Self-pay | Admitting: Physician Assistant

## 2014-11-04 ENCOUNTER — Ambulatory Visit (INDEPENDENT_AMBULATORY_CARE_PROVIDER_SITE_OTHER): Payer: Medicare Other | Admitting: Physician Assistant

## 2014-11-04 VITALS — BP 140/85 | HR 76 | Ht 63.0 in | Wt 270.0 lb

## 2014-11-04 DIAGNOSIS — E119 Type 2 diabetes mellitus without complications: Secondary | ICD-10-CM | POA: Insufficient documentation

## 2014-11-04 DIAGNOSIS — I10 Essential (primary) hypertension: Secondary | ICD-10-CM | POA: Diagnosis not present

## 2014-11-04 DIAGNOSIS — I480 Paroxysmal atrial fibrillation: Secondary | ICD-10-CM

## 2014-11-04 NOTE — Assessment & Plan Note (Signed)
Blood pressure stable. 2 g sodium diet. On Avapro 300 mg daily.
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
## 2014-11-04 NOTE — Patient Instructions (Signed)
Medication Instructions:  Your physician recommends that you continue on your current medications as directed. Please refer to the Current Medication list given to you today.   Labwork: None ordered  Testing/Procedures: None ordered  Follow-Up: Your physician wants you to follow-up in: 12 months with Dr Valere Dross will receive a reminder letter in the mail two months in advance. If you don't receive a letter, please call our office to schedule the follow-up appointment.   Any Other Special Instructions Will Be Listed Below (If Applicable).

## 2014-11-04 NOTE — Assessment & Plan Note (Signed)
Patient is maintaining normal sinus rhythm. Continue Xarelto and metoprolol. Follow-up with Dr. Mare Ferrari in one year.

## 2014-11-04 NOTE — Assessment & Plan Note (Signed)
Exercise and weight loss recommended.

## 2014-11-06 DIAGNOSIS — N3946 Mixed incontinence: Secondary | ICD-10-CM | POA: Diagnosis not present

## 2014-11-11 DIAGNOSIS — D509 Iron deficiency anemia, unspecified: Secondary | ICD-10-CM | POA: Diagnosis not present

## 2014-11-20 DIAGNOSIS — N3946 Mixed incontinence: Secondary | ICD-10-CM | POA: Diagnosis not present

## 2014-11-20 DIAGNOSIS — R3912 Poor urinary stream: Secondary | ICD-10-CM | POA: Diagnosis not present

## 2014-12-20 DIAGNOSIS — D509 Iron deficiency anemia, unspecified: Secondary | ICD-10-CM | POA: Diagnosis not present

## 2015-01-13 DIAGNOSIS — R202 Paresthesia of skin: Secondary | ICD-10-CM | POA: Diagnosis not present

## 2015-01-13 DIAGNOSIS — I480 Paroxysmal atrial fibrillation: Secondary | ICD-10-CM | POA: Diagnosis not present

## 2015-01-13 DIAGNOSIS — E785 Hyperlipidemia, unspecified: Secondary | ICD-10-CM | POA: Diagnosis not present

## 2015-01-13 DIAGNOSIS — E611 Iron deficiency: Secondary | ICD-10-CM | POA: Diagnosis not present

## 2015-01-13 DIAGNOSIS — Z23 Encounter for immunization: Secondary | ICD-10-CM | POA: Diagnosis not present

## 2015-01-13 DIAGNOSIS — K219 Gastro-esophageal reflux disease without esophagitis: Secondary | ICD-10-CM | POA: Diagnosis not present

## 2015-01-13 DIAGNOSIS — E1121 Type 2 diabetes mellitus with diabetic nephropathy: Secondary | ICD-10-CM | POA: Diagnosis not present

## 2015-01-13 DIAGNOSIS — Z794 Long term (current) use of insulin: Secondary | ICD-10-CM | POA: Diagnosis not present

## 2015-01-13 DIAGNOSIS — F324 Major depressive disorder, single episode, in partial remission: Secondary | ICD-10-CM | POA: Diagnosis not present

## 2015-01-13 DIAGNOSIS — I129 Hypertensive chronic kidney disease with stage 1 through stage 4 chronic kidney disease, or unspecified chronic kidney disease: Secondary | ICD-10-CM | POA: Diagnosis not present

## 2015-01-13 DIAGNOSIS — G2581 Restless legs syndrome: Secondary | ICD-10-CM | POA: Diagnosis not present

## 2015-01-13 DIAGNOSIS — N181 Chronic kidney disease, stage 1: Secondary | ICD-10-CM | POA: Diagnosis not present

## 2015-01-27 DIAGNOSIS — N3281 Overactive bladder: Secondary | ICD-10-CM | POA: Diagnosis not present

## 2015-01-27 DIAGNOSIS — N3946 Mixed incontinence: Secondary | ICD-10-CM | POA: Diagnosis not present

## 2015-01-27 DIAGNOSIS — R3912 Poor urinary stream: Secondary | ICD-10-CM | POA: Diagnosis not present

## 2015-02-03 ENCOUNTER — Other Ambulatory Visit: Payer: Self-pay | Admitting: *Deleted

## 2015-02-03 DIAGNOSIS — R200 Anesthesia of skin: Secondary | ICD-10-CM

## 2015-02-04 DIAGNOSIS — Z803 Family history of malignant neoplasm of breast: Secondary | ICD-10-CM | POA: Diagnosis not present

## 2015-02-04 DIAGNOSIS — Z1231 Encounter for screening mammogram for malignant neoplasm of breast: Secondary | ICD-10-CM | POA: Diagnosis not present

## 2015-02-11 ENCOUNTER — Ambulatory Visit (INDEPENDENT_AMBULATORY_CARE_PROVIDER_SITE_OTHER): Payer: Medicare Other | Admitting: Neurology

## 2015-02-11 DIAGNOSIS — R200 Anesthesia of skin: Secondary | ICD-10-CM

## 2015-02-11 DIAGNOSIS — G5603 Carpal tunnel syndrome, bilateral upper limbs: Secondary | ICD-10-CM

## 2015-02-11 NOTE — Procedures (Signed)
[facility]  [address]  [place], [place] [address] Tel: [phone] Fax:  [phone] Test Date:  02/11/2015  Patient: Kristin [name] DOB: 11/21/1955 Physician: Narda Amber, DO  Sex: Female Height: 5' 4"  Ref Phys: C. Dema Severin, M.D.  ID#: [id number] Temp: 34.3C Technician: Jerilynn Mages. Dean   Patient Complaints: This is a 59 year old female referred for evaluation of bilateral hand numbness and tingling.  NCV & EMG Findings: Extensive electrodiagnostic testing of the right upper extremity and additional studies of the left shows: 1. Bilateral median sensory responses show prolonged distal peak latency (R5.4, L4.4 ms) and reduced amplitude (R11.9, L12.7V).  Bilateral ulnar sensory responses are within normal limits. 2. Bilateral median motor responses show prolonged latency (R5.1, L4.73m)with preserved amplitude. Bilateral ulnar motor responses are within normal limits. 3. There is no evidence of active or chronic motor axon loss changes affecting any of the tested muscles. Motor unit configuration and recruitment pattern is within normal limits.  Impression: 1. Bilateral median neuropathy at or distal to the wrist, consistent with the clinical diagnosis of carpal tunnel syndrome. Overall, these findings are moderate-to-severe in degree electrically and worse on the right. 2. There is no evidence of a cervical radiculopathy.   ___________________________ DNarda Amber DO    Nerve Conduction Studies Anti Sensory Summary Table   Site NR Peak (ms) Norm Peak (ms) P-T Amp (V) Norm P-T Amp  Left Median Anti Sensory (2nd Digit)  34.3C  Wrist    4.4 <3.6 12.7 >15  Right Median Anti Sensory (2nd Digit)  34.3C  Wrist    5.4 <3.6 11.9 >15  Left Ulnar Anti Sensory (5th Digit)  34.3C  Wrist    2.9 <3.1 38.8 >10  Right Ulnar Anti Sensory (5th Digit)  34.3C  Wrist    2.4 <3.1 29.1 >10   Motor Summary Table   Site NR Onset (ms) Norm Onset (ms) O-P Amp (mV) Norm O-P  Amp Site1 Site2 Delta-0 (ms) Dist (cm) Vel (m/s) Norm Vel (m/s)  Left Median Motor (Abd Poll Brev)  34.3C  Wrist    4.9 <4.0 10.5 >6 Elbow Wrist 4.0 22.0 55 >50  Elbow    8.9  8.9         Right Median Motor (Abd Poll Brev)  34.3C  Wrist    5.1 <4.0 6.3 >6 Elbow Wrist 3.9 23.0 59 >50  Elbow    9.0  6.3         Left Ulnar Motor (Abd Dig Minimi)  34.3C  Wrist    2.5 <3.1 10.2 >7 B Elbow Wrist 2.8 16.0 57 >50  B Elbow    5.3  9.4  A Elbow B Elbow 1.6 10.0 62 >50  A Elbow    6.9  9.3         Right Ulnar Motor (Abd Dig Minimi)  34.3C  Wrist    2.4 <3.1 11.7 >7 B Elbow Wrist 2.9 17.5 60 >50  B Elbow    5.3  11.3  A Elbow B Elbow 1.5 10.0 67 >50  A Elbow    6.8  11.1          EMG   Side Muscle Ins Act Fibs Psw Fasc Number Recrt Dur Dur. Amp Amp. Poly Poly. Comment  Right 1stDorInt Nml Nml Nml Nml Nml Nml Nml Nml Nml Nml Nml Nml N/A  Right Abd Poll Brev Nml Nml Nml Nml Nml Nml Nml Nml Nml Nml Nml Nml N/A  Right PronatorTeres Nml  Nml Nml Nml Nml Nml Nml Nml Nml Nml Nml Nml N/A  Right Ext Indicis Nml Nml Nml Nml Nml Nml Nml Nml Nml Nml Nml Nml N/A  Right Biceps Nml Nml Nml Nml Nml Nml Nml Nml Nml Nml Nml Nml N/A  Right Triceps Nml Nml Nml Nml Nml Nml Nml Nml Nml Nml Nml Nml N/A  Right Deltoid Nml Nml Nml Nml Nml Nml Nml Nml Nml Nml Nml Nml N/A  Left Abd Poll Brev Nml Nml Nml Nml Nml Nml Nml Nml Nml Nml Nml Nml N/A  Left PronatorTeres Nml Nml Nml Nml Nml Nml Nml Nml Nml Nml Nml Nml N/A      Waveforms:

## 2015-02-25 DIAGNOSIS — D509 Iron deficiency anemia, unspecified: Secondary | ICD-10-CM | POA: Diagnosis not present

## 2015-02-26 DIAGNOSIS — H25042 Posterior subcapsular polar age-related cataract, left eye: Secondary | ICD-10-CM | POA: Diagnosis not present

## 2015-02-26 DIAGNOSIS — E119 Type 2 diabetes mellitus without complications: Secondary | ICD-10-CM | POA: Diagnosis not present

## 2015-02-26 DIAGNOSIS — H2513 Age-related nuclear cataract, bilateral: Secondary | ICD-10-CM | POA: Diagnosis not present

## 2015-03-12 DIAGNOSIS — M1612 Unilateral primary osteoarthritis, left hip: Secondary | ICD-10-CM | POA: Diagnosis not present

## 2015-03-26 DIAGNOSIS — M1612 Unilateral primary osteoarthritis, left hip: Secondary | ICD-10-CM | POA: Diagnosis not present

## 2015-04-02 DIAGNOSIS — G5601 Carpal tunnel syndrome, right upper limb: Secondary | ICD-10-CM | POA: Diagnosis not present

## 2015-04-02 DIAGNOSIS — G5602 Carpal tunnel syndrome, left upper limb: Secondary | ICD-10-CM | POA: Diagnosis not present

## 2015-04-14 DIAGNOSIS — M1612 Unilateral primary osteoarthritis, left hip: Secondary | ICD-10-CM | POA: Diagnosis not present

## 2015-07-23 DIAGNOSIS — G5603 Carpal tunnel syndrome, bilateral upper limbs: Secondary | ICD-10-CM | POA: Diagnosis not present

## 2015-07-23 DIAGNOSIS — G2581 Restless legs syndrome: Secondary | ICD-10-CM | POA: Diagnosis not present

## 2015-07-23 DIAGNOSIS — I129 Hypertensive chronic kidney disease with stage 1 through stage 4 chronic kidney disease, or unspecified chronic kidney disease: Secondary | ICD-10-CM | POA: Diagnosis not present

## 2015-07-23 DIAGNOSIS — R197 Diarrhea, unspecified: Secondary | ICD-10-CM | POA: Diagnosis not present

## 2015-07-23 DIAGNOSIS — D509 Iron deficiency anemia, unspecified: Secondary | ICD-10-CM | POA: Diagnosis not present

## 2015-07-23 DIAGNOSIS — E1121 Type 2 diabetes mellitus with diabetic nephropathy: Secondary | ICD-10-CM | POA: Diagnosis not present

## 2015-07-23 DIAGNOSIS — Z7984 Long term (current) use of oral hypoglycemic drugs: Secondary | ICD-10-CM | POA: Diagnosis not present

## 2015-07-23 DIAGNOSIS — E785 Hyperlipidemia, unspecified: Secondary | ICD-10-CM | POA: Diagnosis not present

## 2015-07-23 DIAGNOSIS — F3341 Major depressive disorder, recurrent, in partial remission: Secondary | ICD-10-CM | POA: Diagnosis not present

## 2015-07-23 DIAGNOSIS — N181 Chronic kidney disease, stage 1: Secondary | ICD-10-CM | POA: Diagnosis not present

## 2015-07-23 DIAGNOSIS — I480 Paroxysmal atrial fibrillation: Secondary | ICD-10-CM | POA: Diagnosis not present

## 2015-09-25 DIAGNOSIS — M19031 Primary osteoarthritis, right wrist: Secondary | ICD-10-CM | POA: Diagnosis not present

## 2015-09-25 DIAGNOSIS — M170 Bilateral primary osteoarthritis of knee: Secondary | ICD-10-CM | POA: Diagnosis not present

## 2015-09-25 DIAGNOSIS — M160 Bilateral primary osteoarthritis of hip: Secondary | ICD-10-CM | POA: Diagnosis not present

## 2015-09-25 DIAGNOSIS — M25571 Pain in right ankle and joints of right foot: Secondary | ICD-10-CM | POA: Diagnosis not present

## 2015-12-03 ENCOUNTER — Telehealth: Payer: Self-pay | Admitting: Physician Assistant

## 2015-12-03 NOTE — Telephone Encounter (Signed)
Patient c/o Palpitations:  High priority if patient c/o lightheadedness and shortness of breath.  1. How long have you been having palpitations? last week  2. Are you currently experiencing lightheadedness and shortness of breath?no  3. Have you checked your BP and heart rate? (document readings) no  4. Are you experiencing any other symptoms? No

## 2015-12-03 NOTE — Telephone Encounter (Signed)
Spoke with pt. She reports palpitations for the past week. Occur 1-2 times daily.  Previously saw Dr. Mare Ferrari and last saw Kristin Barrios, PA on 11/04/14 with one year follow up planned. Pt would like appt on 12/17/15 if possible.  Appt made for her to see Kristin Copa, PA on 12/17/15 at 3:00.  I told her it would be determined at this appt which cardiologist would follow her long term.

## 2015-12-17 ENCOUNTER — Ambulatory Visit (INDEPENDENT_AMBULATORY_CARE_PROVIDER_SITE_OTHER): Payer: Medicare Other | Admitting: Physician Assistant

## 2015-12-17 ENCOUNTER — Encounter (INDEPENDENT_AMBULATORY_CARE_PROVIDER_SITE_OTHER): Payer: Self-pay

## 2015-12-17 ENCOUNTER — Encounter: Payer: Self-pay | Admitting: Physician Assistant

## 2015-12-17 VITALS — BP 166/82 | HR 66 | Ht 63.0 in | Wt 266.8 lb

## 2015-12-17 DIAGNOSIS — I447 Left bundle-branch block, unspecified: Secondary | ICD-10-CM

## 2015-12-17 DIAGNOSIS — D509 Iron deficiency anemia, unspecified: Secondary | ICD-10-CM | POA: Insufficient documentation

## 2015-12-17 DIAGNOSIS — I10 Essential (primary) hypertension: Secondary | ICD-10-CM

## 2015-12-17 DIAGNOSIS — I480 Paroxysmal atrial fibrillation: Secondary | ICD-10-CM | POA: Diagnosis not present

## 2015-12-17 DIAGNOSIS — R002 Palpitations: Secondary | ICD-10-CM | POA: Diagnosis not present

## 2015-12-17 LAB — CBC WITH DIFFERENTIAL/PLATELET
Basophils Absolute: 92 cells/uL (ref 0–200)
Basophils Relative: 1 %
Eosinophils Absolute: 368 cells/uL (ref 15–500)
Eosinophils Relative: 4 %
HCT: 44.9 % (ref 35.0–45.0)
Hemoglobin: 14.7 g/dL (ref 11.7–15.5)
Lymphocytes Relative: 25 %
Lymphs Abs: 2300 cells/uL (ref 850–3900)
MCH: 28.4 pg (ref 27.0–33.0)
MCHC: 32.7 g/dL (ref 32.0–36.0)
MCV: 86.7 fL (ref 80.0–100.0)
MPV: 9.6 fL (ref 7.5–12.5)
Monocytes Absolute: 644 cells/uL (ref 200–950)
Monocytes Relative: 7 %
Neutro Abs: 5796 cells/uL (ref 1500–7800)
Neutrophils Relative %: 63 %
Platelets: 311 10*3/uL (ref 140–400)
RBC: 5.18 MIL/uL — ABNORMAL HIGH (ref 3.80–5.10)
RDW: 14.9 % (ref 11.0–15.0)
WBC: 9.2 10*3/uL (ref 3.8–10.8)

## 2015-12-17 LAB — TSH: TSH: 1.43 mIU/L
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
## 2015-12-17 NOTE — Addendum Note (Signed)
Addended by: Eulis Foster on: 12/17/2015 04:11 PM   Modules accepted: Orders

## 2015-12-17 NOTE — Patient Instructions (Signed)
Medication Instructions: Continue same medications   Labwork:Today ( bmet,magnesium,cbc,tsh )   Testing/Procedures:   Follow-Up:4 weeks with Sharrell Ku PA   Any Other Special Instructions Will Be Listed Below Check blood pressure daily at home call office this Fri 9/22 or Mon 9/25     If you need a refill on your cardiac medications before your next appointment, please call your pharmacy.

## 2015-12-18 LAB — BASIC METABOLIC PANEL
BUN: 15 mg/dL (ref 7–25)
CO2: 26 mmol/L (ref 20–31)
Calcium: 10 mg/dL (ref 8.6–10.4)
Chloride: 102 mmol/L (ref 98–110)
Creat: 0.78 mg/dL (ref 0.50–1.05)
Glucose, Bld: 90 mg/dL (ref 65–99)
Potassium: 4.4 mmol/L (ref 3.5–5.3)
Sodium: 140 mmol/L (ref 135–146)

## 2015-12-18 LAB — MAGNESIUM: Magnesium: 1.5 mg/dL (ref 1.5–2.5)

## 2015-12-24 ENCOUNTER — Telehealth: Payer: Self-pay | Admitting: Physician Assistant

## 2015-12-24 NOTE — Telephone Encounter (Signed)
New message      Returning a call to the nurse to get lab results

## 2015-12-25 NOTE — Telephone Encounter (Signed)
Returned pts call and left a message for her to return my call.

## 2015-12-30 ENCOUNTER — Telehealth: Payer: Self-pay | Admitting: Physician Assistant

## 2015-12-30 MED ORDER — MAGNESIUM OXIDE -MG SUPPLEMENT 400 (240 MG) MG PO TABS: 1.0000 | ORAL_TABLET | Freq: Every day | ORAL | 3 refills | Status: DC

## 2015-12-30 NOTE — Telephone Encounter (Signed)
New message ° ° °Patient returning call back to nurse.  °

## 2015-12-30 NOTE — Telephone Encounter (Signed)
-----   Message from Charlie Pitter, [place] sent at 12/18/2015  6:54 AM EDT ----- Please call patient. Labs are stable except Mg level is on the lower end. Start MagOx 464m daily. Please increase dietary intake of healthy sources of magnesium including leafy greens, nuts, seeds, fish, beans, whole grains, avocados, yogurt, and bananas. Thanks. [name] Dunn PA-C

## 2015-12-30 NOTE — Telephone Encounter (Signed)
Pt aware of lab results and is agreeable with taking Mag Ox 400 mg daily and increasing dietary foods. rx called into [facility], per pt request.

## 2016-01-20 DIAGNOSIS — E1121 Type 2 diabetes mellitus with diabetic nephropathy: Secondary | ICD-10-CM | POA: Diagnosis not present

## 2016-01-20 DIAGNOSIS — G4733 Obstructive sleep apnea (adult) (pediatric): Secondary | ICD-10-CM | POA: Diagnosis not present

## 2016-01-20 DIAGNOSIS — D509 Iron deficiency anemia, unspecified: Secondary | ICD-10-CM | POA: Diagnosis not present

## 2016-01-20 DIAGNOSIS — N181 Chronic kidney disease, stage 1: Secondary | ICD-10-CM | POA: Diagnosis not present

## 2016-01-20 DIAGNOSIS — Z23 Encounter for immunization: Secondary | ICD-10-CM | POA: Diagnosis not present

## 2016-01-20 DIAGNOSIS — E785 Hyperlipidemia, unspecified: Secondary | ICD-10-CM | POA: Diagnosis not present

## 2016-01-20 DIAGNOSIS — I129 Hypertensive chronic kidney disease with stage 1 through stage 4 chronic kidney disease, or unspecified chronic kidney disease: Secondary | ICD-10-CM | POA: Diagnosis not present

## 2016-01-20 DIAGNOSIS — F322 Major depressive disorder, single episode, severe without psychotic features: Secondary | ICD-10-CM | POA: Diagnosis not present

## 2016-01-21 ENCOUNTER — Ambulatory Visit: Payer: Medicare Other | Admitting: Physician Assistant

## 2016-01-26 ENCOUNTER — Encounter: Payer: Self-pay | Admitting: Physician Assistant

## 2016-01-26 ENCOUNTER — Ambulatory Visit (INDEPENDENT_AMBULATORY_CARE_PROVIDER_SITE_OTHER): Payer: Medicare Other | Admitting: Physician Assistant

## 2016-01-26 VITALS — BP 130/80 | HR 70 | Ht 63.0 in | Wt 274.1 lb

## 2016-01-26 DIAGNOSIS — R002 Palpitations: Secondary | ICD-10-CM | POA: Diagnosis not present

## 2016-01-26 DIAGNOSIS — I10 Essential (primary) hypertension: Secondary | ICD-10-CM | POA: Diagnosis not present

## 2016-01-26 DIAGNOSIS — I480 Paroxysmal atrial fibrillation: Secondary | ICD-10-CM

## 2016-01-26 MED ORDER — MAGNESIUM OXIDE -MG SUPPLEMENT 400 (240 MG) MG PO TABS: 1.0000 | ORAL_TABLET | Freq: Two times a day (BID) | ORAL | 3 refills | Status: DC
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
## 2016-01-26 NOTE — Patient Instructions (Signed)
Medication Instructions:  Your physician has recommended you make the following change in your medication:  1.  INCREASE the Mag Ox to 400 mg taking 1 tablet twice a day   Labwork: None ordered  Testing/Procedures: None ordered  Follow-Up: Your physician wants you to follow-up in: 4 MONTHS WITH DR. Marlou Porch You will receive a reminder letter in the mail two months in advance. If you don't receive a letter, please call our office to schedule the follow-up appointment.   Any Other Special Instructions Will Be Listed Below (If Applicable).     If you need a refill on your cardiac medications before your next appointment, please call your pharmacy.

## 2016-01-28 ENCOUNTER — Encounter: Payer: Self-pay | Admitting: Physician Assistant

## 2016-03-09 ENCOUNTER — Ambulatory Visit (INDEPENDENT_AMBULATORY_CARE_PROVIDER_SITE_OTHER): Payer: Medicare Other | Admitting: Neurology

## 2016-03-09 ENCOUNTER — Encounter: Payer: Self-pay | Admitting: Neurology

## 2016-03-09 VITALS — Resp 20 | Ht 63.0 in | Wt 275.0 lb

## 2016-03-09 DIAGNOSIS — D508 Other iron deficiency anemias: Secondary | ICD-10-CM | POA: Diagnosis not present

## 2016-03-09 DIAGNOSIS — N823 Fistula of vagina to large intestine: Secondary | ICD-10-CM

## 2016-03-09 DIAGNOSIS — G4733 Obstructive sleep apnea (adult) (pediatric): Secondary | ICD-10-CM

## 2016-03-09 DIAGNOSIS — Z933 Colostomy status: Secondary | ICD-10-CM

## 2016-03-09 DIAGNOSIS — G4721 Circadian rhythm sleep disorder, delayed sleep phase type: Secondary | ICD-10-CM

## 2016-03-09 DIAGNOSIS — G2581 Restless legs syndrome: Secondary | ICD-10-CM | POA: Diagnosis not present

## 2016-03-09 DIAGNOSIS — Z9989 Dependence on other enabling machines and devices: Secondary | ICD-10-CM

## 2016-03-09 DIAGNOSIS — E669 Obesity, unspecified: Secondary | ICD-10-CM
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
## 2016-03-09 NOTE — Addendum Note (Signed)
Addended by: Larey Seat on: 03/09/2016 04:14 PM   Modules accepted: Orders

## 2016-03-09 NOTE — Patient Instructions (Addendum)
I will order an iron infusion to bring up the ferritin level. My hope is that this will make a big difference in her restless legs or restless limbs. He usually uses a medication called Venifer. I will try to see if he can give it in office as Medicare and Medicaid often do not contract with in office infusion therapies. Otherwise I will send an infusion order to [name] or [name] at your choice.  I agree that he should be on chronic anticoagulation given your consider remarkable comorbidity due to paroxysmal atrial fibrillation. Since he also suffered from hypertension, diabetes mellitus you at higher risk for renal disease , as one who has developed microalbuminuria. Polyneuropathy due to diabetes is also sometimes contributing to a restless leg picture.  As to your apnea it is very well controlled with the needs to be no changes made.  I like to see you again after you have received at least 2 infusions of iron, usually you received a test dose first. I would like for you to keep sleep diary until we meet again. Please take melatonin half an hour before intended bedtime, you intended bedtime should not be later that midnight. Try to reset your inner clock by sleep deprivation for 24 - 36 hours.   Kristin Williamson, MD  Please remember to try to maintain good sleep hygiene, which means: Keep a regular sleep and wake schedule, try not to exercise or have a meal within 2 hours of your bedtime, try to keep your bedroom conducive for sleep, that is, cool and dark, without light distractors such as an illuminated alarm clock, and refrain from watching TV right before sleep or in the middle of the night and do not keep the TV or radio on during the night. Also, try not to use or play on electronic devices at bedtime, such as your cell phone, tablet PC or laptop. If you like to read at bedtime on an electronic device, try to dim the background light as much as possible. Do not eat in the middle  of the night.   For chronic insomnia, you are best followed by a psychiatrist and/or sleep psychologist.

## 2016-03-20 IMAGING — CT CT RENAL STONE PROTOCOL
1 series · 15 of 30 positions shown, 19 images · non-contrast
Comparison: 11/01/2013

CLINICAL DATA: Left flank pain.

EXAM:
CT ABDOMEN AND PELVIS WITHOUT CONTRAST
TECHNIQUE: Multidetector CT imaging of the abdomen and pelvis was performed
following the standard protocol without IV contrast.

[Series 6: lung · axial · 0.94mm/px · z∈[+2,+132]mm · 15 of 30 slices shown, 19 images]
[im 3/30  soft-tissue]
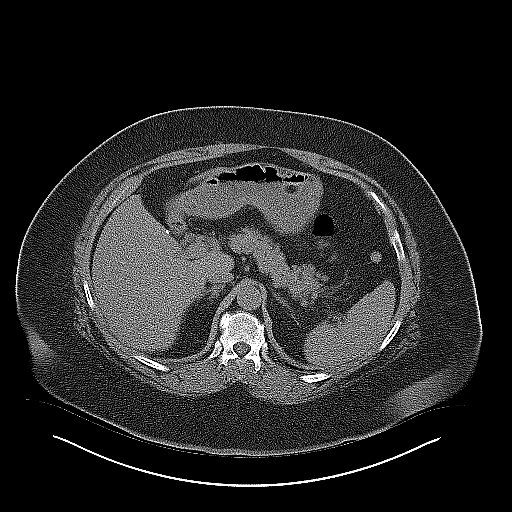
[im 3/30  bone]
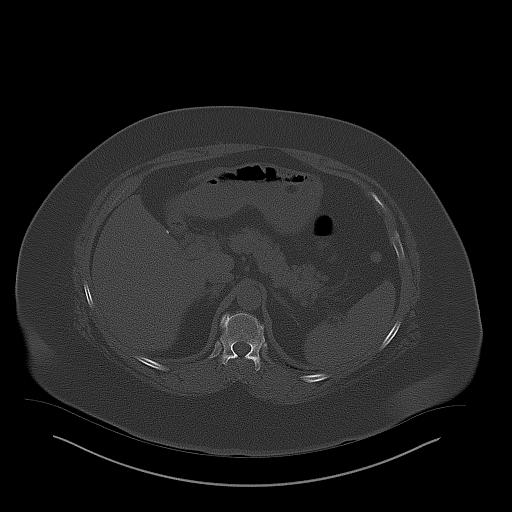
[im 5/30  soft-tissue]
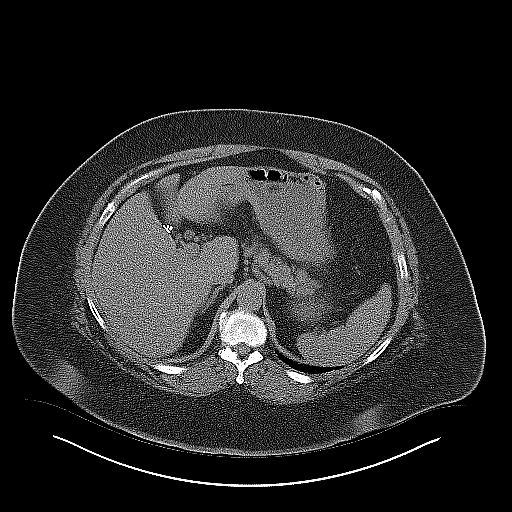
[im 7/30  soft-tissue]
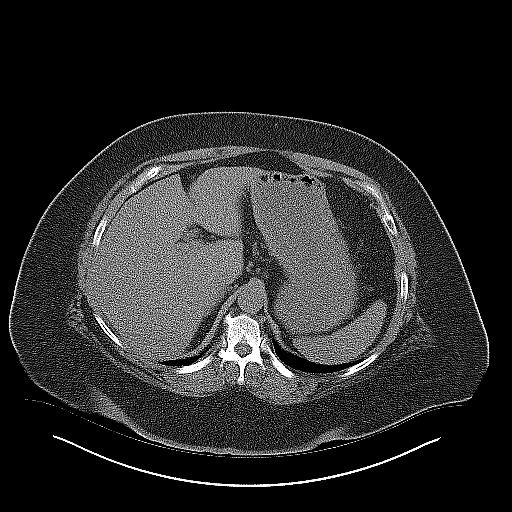
[im 9/30  soft-tissue]
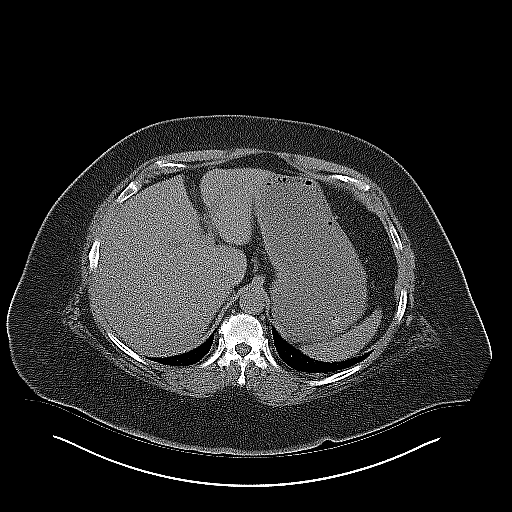
[im 11/30  soft-tissue]
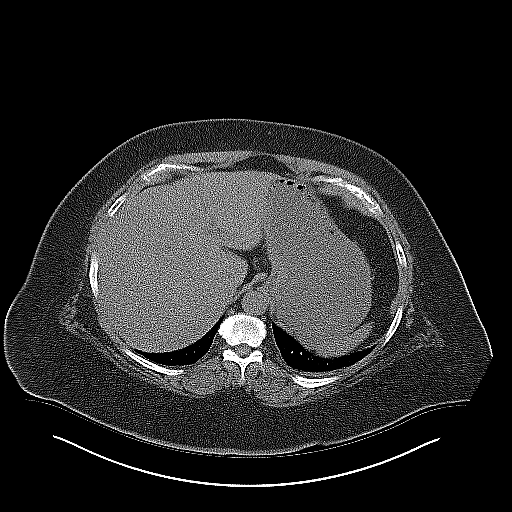
[im 13/30  soft-tissue]
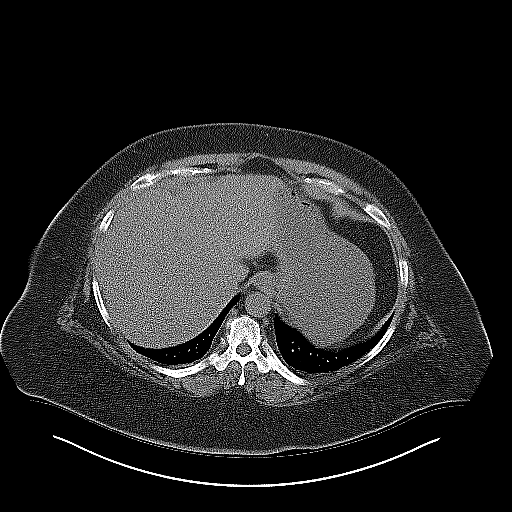
[im 16/30  soft-tissue]
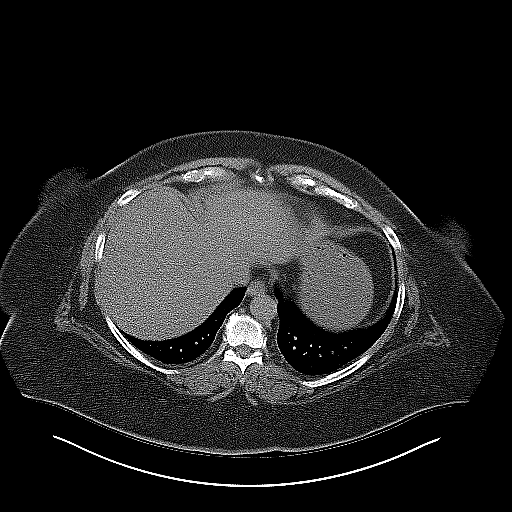
[im 18/30  soft-tissue]
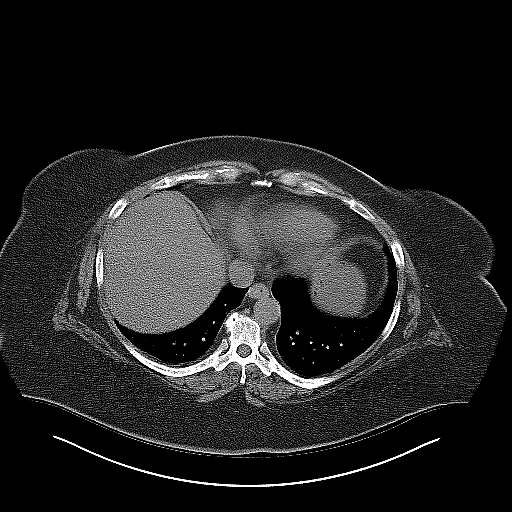
[im 20/30  soft-tissue]
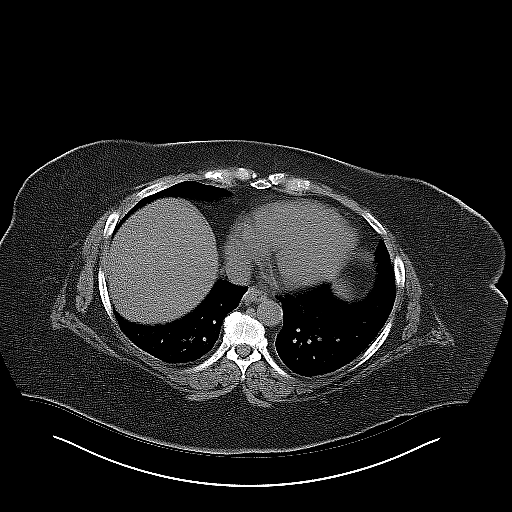
[im 20/30  bone]
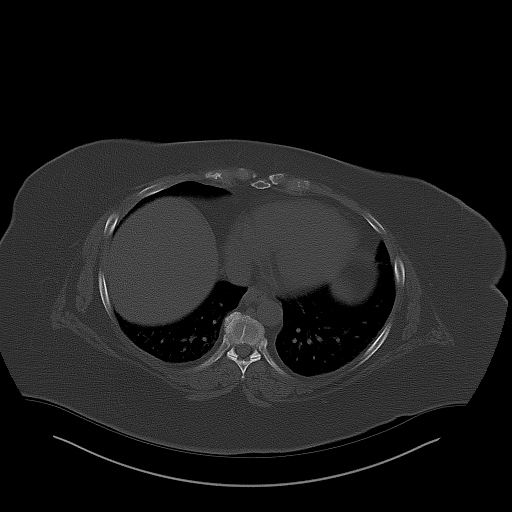
[im 22/30  soft-tissue]
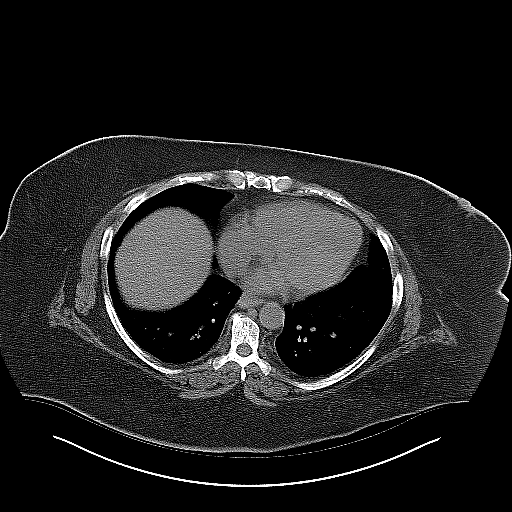
[im 24/30  soft-tissue]
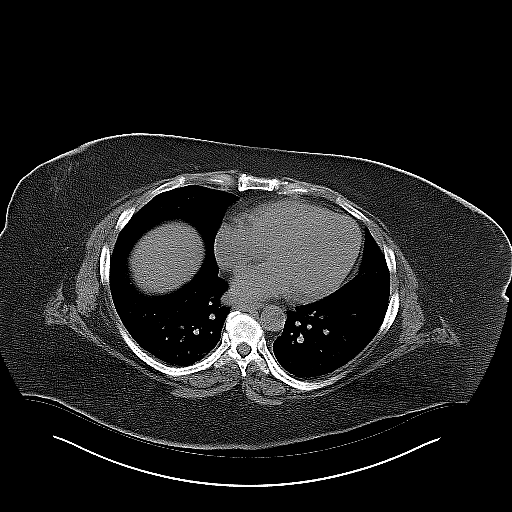
[im 26/30  soft-tissue]
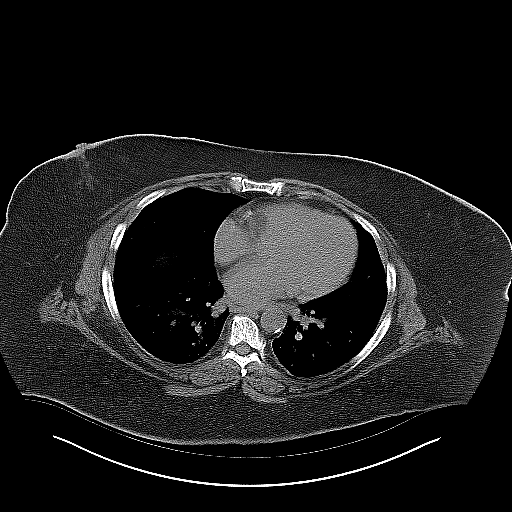
[im 26/30  lung]
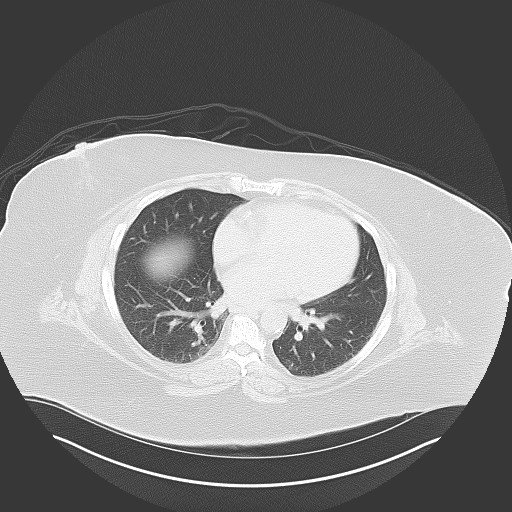
[im 27/30  lung]
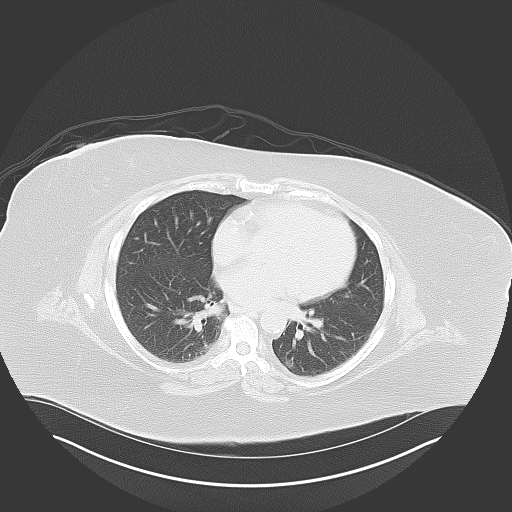
[im 28/30  soft-tissue]
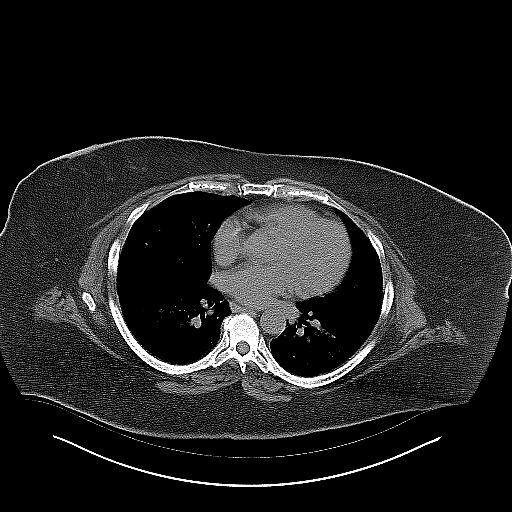
[im 28/30  lung]
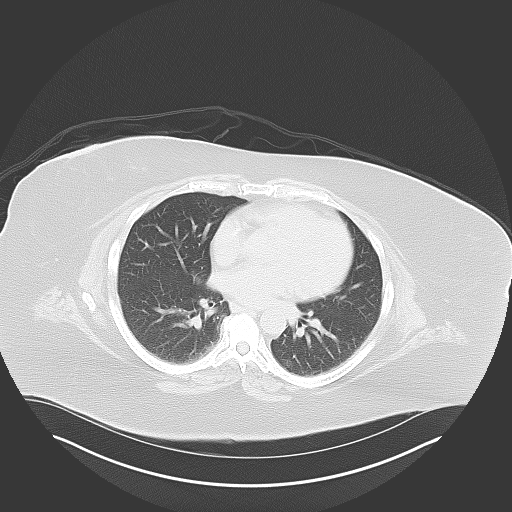
[im 29/30  lung]
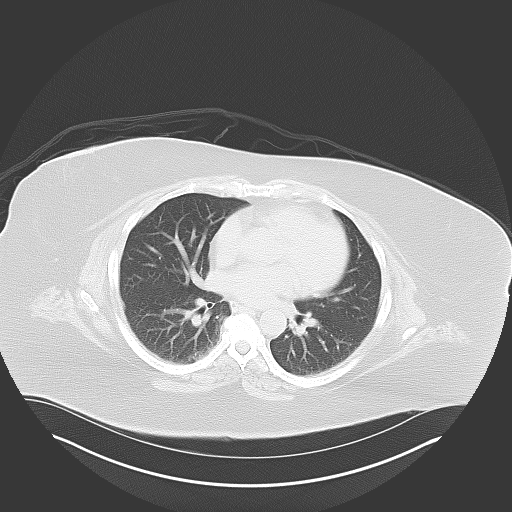

[15 of 30 positions shown; findings below may reference images not displayed]

FINDINGS: Moderate left perinephric stranding and hydronephrosis, secondary
findings of ureteral obstruction, are associated with to
ureteropelvic junction calculi, measuring 8 and 5 mm. 6 mm calculus
is present in the lower pole of the left kidney.

No right ureteral calculus.  No right hydronephrosis.

Stable left lower quadrant anterior abdominal hernia containing
colon. No evidence of colon dilatation or obstruction.

Stable bilateral adnexal cystic lesions.

Liver, spleen, pancreas, adrenal glands are within normal limits

Postcholecystectomy

Left L4-5 paracentral disc herniation is suspected.

Prior instrumentation in the proximal right femur.
IMPRESSION: There are 2 left ureteropelvic junction calculi associated with
secondary findings of left ureteral obstruction.

Left nephrolithiasis.

Stable chronic changes.

## 2016-03-31 DIAGNOSIS — E119 Type 2 diabetes mellitus without complications: Secondary | ICD-10-CM | POA: Diagnosis not present

## 2016-03-31 DIAGNOSIS — H2513 Age-related nuclear cataract, bilateral: Secondary | ICD-10-CM | POA: Diagnosis not present

## 2016-03-31 DIAGNOSIS — Z0100 Encounter for examination of eyes and vision without abnormal findings: Secondary | ICD-10-CM | POA: Diagnosis not present

## 2016-04-08 ENCOUNTER — Ambulatory Visit (INDEPENDENT_AMBULATORY_CARE_PROVIDER_SITE_OTHER): Payer: Medicare Other | Admitting: Orthopedic Surgery

## 2016-04-08 ENCOUNTER — Encounter (INDEPENDENT_AMBULATORY_CARE_PROVIDER_SITE_OTHER): Payer: Self-pay | Admitting: Orthopedic Surgery

## 2016-04-08 DIAGNOSIS — M7541 Impingement syndrome of right shoulder: Secondary | ICD-10-CM | POA: Diagnosis not present

## 2016-04-08 DIAGNOSIS — M545 Low back pain, unspecified: Secondary | ICD-10-CM | POA: Insufficient documentation

## 2016-04-08 DIAGNOSIS — M67432 Ganglion, left wrist: Secondary | ICD-10-CM

## 2016-04-08 DIAGNOSIS — G8929 Other chronic pain: Secondary | ICD-10-CM

## 2016-04-08 MED ORDER — LIDOCAINE HCL 1 % IJ SOLN
5.0000 mL | INTRAMUSCULAR | Status: AC | PRN
  Administered 2016-04-08: 5 mL

## 2016-04-08 MED ORDER — METHYLPREDNISOLONE ACETATE 40 MG/ML IJ SUSP
40.0000 mg | INTRAMUSCULAR | Status: AC | PRN
  Administered 2016-04-08: 40 mg via INTRA_ARTICULAR
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
## 2016-04-14 ENCOUNTER — Encounter (INDEPENDENT_AMBULATORY_CARE_PROVIDER_SITE_OTHER): Payer: Self-pay | Admitting: Orthopedic Surgery

## 2016-04-17 ENCOUNTER — Other Ambulatory Visit (INDEPENDENT_AMBULATORY_CARE_PROVIDER_SITE_OTHER): Payer: Self-pay | Admitting: Orthopedic Surgery

## 2016-04-17 MED ORDER — METHOCARBAMOL 500 MG PO TABS: 500.0000 mg | ORAL_TABLET | Freq: Three times a day (TID) | ORAL | 0 refills | Status: DC

## 2016-06-10 ENCOUNTER — Ambulatory Visit: Payer: Medicare Other | Admitting: Neurology

## 2016-06-28 ENCOUNTER — Encounter: Payer: Self-pay | Admitting: Cardiology

## 2016-06-28 ENCOUNTER — Ambulatory Visit (INDEPENDENT_AMBULATORY_CARE_PROVIDER_SITE_OTHER): Payer: Medicare Other | Admitting: Cardiology

## 2016-06-28 VITALS — BP 140/86 | HR 70 | Ht 63.0 in | Wt 280.6 lb

## 2016-06-28 DIAGNOSIS — I480 Paroxysmal atrial fibrillation: Secondary | ICD-10-CM

## 2016-06-28 DIAGNOSIS — D509 Iron deficiency anemia, unspecified: Secondary | ICD-10-CM

## 2016-06-28 DIAGNOSIS — R079 Chest pain, unspecified: Secondary | ICD-10-CM

## 2016-06-28 DIAGNOSIS — R002 Palpitations: Secondary | ICD-10-CM

## 2016-06-28 DIAGNOSIS — I10 Essential (primary) hypertension: Secondary | ICD-10-CM | POA: Diagnosis not present

## 2016-06-28 NOTE — Patient Instructions (Signed)
Medication Instructions:  Your physician recommends that you continue on your current medications as directed. Please refer to the Current Medication list given to you today.   Labwork: NONE ORDERED  Testing/Procedures: 1. Your physician has requested that you have a lexiscan myoview. For further information please visit [email or web address]. Please follow instruction sheet, as given.   Follow-Up: Your physician wants you to follow-up in: DR. SKAINS 6 MONTHS. THIS APPT WILL NEED TO BE WITH DR. Marlou Porch.   You will receive a reminder letter in the mail two months in advance. If you don't receive a letter, please call our office to schedule the follow-up appointment.  Any Other Special Instructions Will Be Listed Below (If Applicable).     If you need a refill on your cardiac medications before your next appointment, please call your pharmacy.
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
## 2016-07-01 ENCOUNTER — Telehealth (HOSPITAL_COMMUNITY): Payer: Self-pay | Admitting: *Deleted

## 2016-07-01 NOTE — Telephone Encounter (Signed)
Patient given detailed instructions per Myocardial Perfusion Study Information Sheet for the test on  07/06/16. Patient notified to arrive 15 minutes early and that it is imperative to arrive on time for appointment to keep from having the test rescheduled.  If you need to cancel or reschedule your appointment, please call the office within 24 hours of your appointment. Failure to do so may result in a cancellation of your appointment, and a $50 no show fee. Patient verbalized understanding. Kirstie Peri

## 2016-07-06 ENCOUNTER — Ambulatory Visit (HOSPITAL_COMMUNITY): Payer: Medicare Other | Attending: Internal Medicine

## 2016-07-06 DIAGNOSIS — R079 Chest pain, unspecified: Secondary | ICD-10-CM | POA: Diagnosis not present

## 2016-07-06 MED ORDER — TECHNETIUM TC 99M TETROFOSMIN IV KIT
32.4000 | PACK | Freq: Once | INTRAVENOUS | Status: AC | PRN
  Administered 2016-07-06: 32.4 via INTRAVENOUS
  Filled 2016-07-06: qty 33

## 2016-07-06 MED ORDER — REGADENOSON 0.4 MG/5ML IV SOLN
0.4000 mg | Freq: Once | INTRAVENOUS | Status: AC
  Administered 2016-07-06: 0.4 mg via INTRAVENOUS

## 2016-07-07 ENCOUNTER — Ambulatory Visit (HOSPITAL_COMMUNITY): Payer: Medicare Other | Attending: Cardiology

## 2016-07-07 LAB — MYOCARDIAL PERFUSION IMAGING
LV dias vol: 77 mL (ref 46–106)
LV sys vol: 32 mL
Peak HR: 82 {beats}/min
RATE: 0.25
Rest HR: 67 {beats}/min
SDS: 3
SRS: 15
SSS: 18
TID: 0.79

## 2016-07-07 MED ORDER — TECHNETIUM TC 99M TETROFOSMIN IV KIT
31.6000 | PACK | Freq: Once | INTRAVENOUS | Status: AC | PRN
  Administered 2016-07-07: 31.6 via INTRAVENOUS
  Filled 2016-07-07: qty 32

## 2016-07-08 ENCOUNTER — Telehealth: Payer: Self-pay | Admitting: Physician Assistant

## 2016-07-08 NOTE — Telephone Encounter (Signed)
New Message     Pt returning [place] call about test results

## 2016-07-08 NOTE — Telephone Encounter (Signed)
-----   Message from Isaiah Serge, NP sent at 07/07/2016  3:35 PM EDT ----- Stress test is normal.  No ischemia, pump action of the heart is normal.  Most likely muscular skeletal pain, if pain continues have her come back in a week or so if not then with Dr. Marlou Porch in 4-6 months.

## 2016-08-26 DIAGNOSIS — E785 Hyperlipidemia, unspecified: Secondary | ICD-10-CM | POA: Diagnosis not present

## 2016-08-26 DIAGNOSIS — G4730 Sleep apnea, unspecified: Secondary | ICD-10-CM | POA: Diagnosis not present

## 2016-08-26 DIAGNOSIS — Z7984 Long term (current) use of oral hypoglycemic drugs: Secondary | ICD-10-CM | POA: Diagnosis not present

## 2016-08-26 DIAGNOSIS — E1121 Type 2 diabetes mellitus with diabetic nephropathy: Secondary | ICD-10-CM | POA: Diagnosis not present

## 2016-08-26 DIAGNOSIS — N181 Chronic kidney disease, stage 1: Secondary | ICD-10-CM | POA: Diagnosis not present

## 2016-08-26 DIAGNOSIS — G2581 Restless legs syndrome: Secondary | ICD-10-CM | POA: Diagnosis not present

## 2016-08-26 DIAGNOSIS — I129 Hypertensive chronic kidney disease with stage 1 through stage 4 chronic kidney disease, or unspecified chronic kidney disease: Secondary | ICD-10-CM | POA: Diagnosis not present

## 2016-08-26 DIAGNOSIS — F322 Major depressive disorder, single episode, severe without psychotic features: Secondary | ICD-10-CM | POA: Diagnosis not present

## 2016-08-26 DIAGNOSIS — D509 Iron deficiency anemia, unspecified: Secondary | ICD-10-CM | POA: Diagnosis not present

## 2016-11-25 DIAGNOSIS — Z1231 Encounter for screening mammogram for malignant neoplasm of breast: Secondary | ICD-10-CM | POA: Diagnosis not present

## 2016-11-25 DIAGNOSIS — M85851 Other specified disorders of bone density and structure, right thigh: Secondary | ICD-10-CM | POA: Diagnosis not present

## 2017-01-13 DIAGNOSIS — I10 Essential (primary) hypertension: Secondary | ICD-10-CM | POA: Diagnosis not present

## 2017-01-13 DIAGNOSIS — K219 Gastro-esophageal reflux disease without esophagitis: Secondary | ICD-10-CM | POA: Diagnosis not present

## 2017-01-13 DIAGNOSIS — Z0000 Encounter for general adult medical examination without abnormal findings: Secondary | ICD-10-CM | POA: Diagnosis not present

## 2017-01-13 DIAGNOSIS — Z23 Encounter for immunization: Secondary | ICD-10-CM | POA: Diagnosis not present

## 2017-01-13 DIAGNOSIS — G2581 Restless legs syndrome: Secondary | ICD-10-CM | POA: Diagnosis not present

## 2017-01-13 DIAGNOSIS — F324 Major depressive disorder, single episode, in partial remission: Secondary | ICD-10-CM | POA: Diagnosis not present

## 2017-01-13 DIAGNOSIS — E119 Type 2 diabetes mellitus without complications: Secondary | ICD-10-CM | POA: Diagnosis not present

## 2017-01-13 DIAGNOSIS — D509 Iron deficiency anemia, unspecified: Secondary | ICD-10-CM | POA: Diagnosis not present

## 2017-01-13 DIAGNOSIS — E785 Hyperlipidemia, unspecified: Secondary | ICD-10-CM | POA: Diagnosis not present

## 2017-01-13 DIAGNOSIS — Z7984 Long term (current) use of oral hypoglycemic drugs: Secondary | ICD-10-CM | POA: Diagnosis not present

## 2017-01-13 DIAGNOSIS — Z6841 Body Mass Index (BMI) 40.0 and over, adult: Secondary | ICD-10-CM | POA: Diagnosis not present

## 2017-01-19 DIAGNOSIS — Z1389 Encounter for screening for other disorder: Secondary | ICD-10-CM | POA: Diagnosis not present

## 2017-01-19 DIAGNOSIS — Z01419 Encounter for gynecological examination (general) (routine) without abnormal findings: Secondary | ICD-10-CM | POA: Diagnosis not present

## 2017-01-19 DIAGNOSIS — N83209 Unspecified ovarian cyst, unspecified side: Secondary | ICD-10-CM | POA: Diagnosis not present

## 2017-01-19 DIAGNOSIS — Z130 Encounter for screening for diseases of the blood and blood-forming organs and certain disorders involving the immune mechanism: Secondary | ICD-10-CM | POA: Diagnosis not present

## 2017-01-20 DIAGNOSIS — Z124 Encounter for screening for malignant neoplasm of cervix: Secondary | ICD-10-CM | POA: Diagnosis not present

## 2017-02-02 DIAGNOSIS — N83201 Unspecified ovarian cyst, right side: Secondary | ICD-10-CM | POA: Diagnosis not present

## 2017-02-02 DIAGNOSIS — D251 Intramural leiomyoma of uterus: Secondary | ICD-10-CM | POA: Diagnosis not present

## 2017-02-02 DIAGNOSIS — N83202 Unspecified ovarian cyst, left side: Secondary | ICD-10-CM | POA: Diagnosis not present

## 2017-03-29 HISTORY — PX: CATARACT EXTRACTION, BILATERAL: SHX1313

## 2017-04-04 ENCOUNTER — Other Ambulatory Visit: Payer: Self-pay | Admitting: Physician Assistant

## 2017-05-17 DIAGNOSIS — D251 Intramural leiomyoma of uterus: Secondary | ICD-10-CM | POA: Diagnosis not present

## 2017-05-17 DIAGNOSIS — N83201 Unspecified ovarian cyst, right side: Secondary | ICD-10-CM | POA: Diagnosis not present

## 2017-05-17 DIAGNOSIS — N83202 Unspecified ovarian cyst, left side: Secondary | ICD-10-CM | POA: Diagnosis not present

## 2017-06-06 ENCOUNTER — Ambulatory Visit (INDEPENDENT_AMBULATORY_CARE_PROVIDER_SITE_OTHER): Payer: Medicare Other

## 2017-06-06 ENCOUNTER — Encounter (INDEPENDENT_AMBULATORY_CARE_PROVIDER_SITE_OTHER): Payer: Self-pay | Admitting: Orthopedic Surgery

## 2017-06-06 ENCOUNTER — Ambulatory Visit (INDEPENDENT_AMBULATORY_CARE_PROVIDER_SITE_OTHER): Payer: Medicare Other | Admitting: Orthopedic Surgery

## 2017-06-06 VITALS — Ht 63.0 in | Wt 280.0 lb

## 2017-06-06 DIAGNOSIS — M25512 Pain in left shoulder: Secondary | ICD-10-CM

## 2017-06-06 DIAGNOSIS — Z981 Arthrodesis status: Secondary | ICD-10-CM | POA: Diagnosis not present

## 2017-06-06 MED ORDER — LIDOCAINE HCL 1 % IJ SOLN
5.0000 mL | INTRAMUSCULAR | Status: AC | PRN
  Administered 2017-06-06: 5 mL

## 2017-06-06 MED ORDER — METHYLPREDNISOLONE ACETATE 40 MG/ML IJ SUSP
40.0000 mg | INTRAMUSCULAR | Status: AC | PRN
  Administered 2017-06-06: 40 mg via INTRA_ARTICULAR
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
## 2017-07-04 ENCOUNTER — Ambulatory Visit (INDEPENDENT_AMBULATORY_CARE_PROVIDER_SITE_OTHER): Payer: Medicare Other | Admitting: Orthopedic Surgery

## 2017-07-04 ENCOUNTER — Encounter (INDEPENDENT_AMBULATORY_CARE_PROVIDER_SITE_OTHER): Payer: Self-pay | Admitting: Orthopedic Surgery

## 2017-07-04 VITALS — Ht 63.0 in | Wt 280.0 lb

## 2017-07-04 DIAGNOSIS — M25512 Pain in left shoulder: Secondary | ICD-10-CM | POA: Diagnosis not present

## 2017-07-04 DIAGNOSIS — G8929 Other chronic pain: Secondary | ICD-10-CM | POA: Diagnosis not present
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
## 2017-07-12 ENCOUNTER — Ambulatory Visit
Admission: RE | Admit: 2017-07-12 | Discharge: 2017-07-12 | Disposition: A | Discharge status: Home / Self Care | Payer: Medicare Other | Source: Ambulatory Visit | Attending: Orthopedic Surgery | Admitting: Orthopedic Surgery

## 2017-07-12 DIAGNOSIS — M25512 Pain in left shoulder: Principal | ICD-10-CM

## 2017-07-12 DIAGNOSIS — M75122 Complete rotator cuff tear or rupture of left shoulder, not specified as traumatic: Secondary | ICD-10-CM | POA: Diagnosis not present

## 2017-07-12 DIAGNOSIS — G8929 Other chronic pain: Secondary | ICD-10-CM

## 2017-07-14 DIAGNOSIS — Z7984 Long term (current) use of oral hypoglycemic drugs: Secondary | ICD-10-CM | POA: Diagnosis not present

## 2017-07-14 DIAGNOSIS — E785 Hyperlipidemia, unspecified: Secondary | ICD-10-CM | POA: Diagnosis not present

## 2017-07-14 DIAGNOSIS — I480 Paroxysmal atrial fibrillation: Secondary | ICD-10-CM | POA: Diagnosis not present

## 2017-07-14 DIAGNOSIS — E1169 Type 2 diabetes mellitus with other specified complication: Secondary | ICD-10-CM | POA: Diagnosis not present

## 2017-07-14 DIAGNOSIS — E1165 Type 2 diabetes mellitus with hyperglycemia: Secondary | ICD-10-CM | POA: Diagnosis not present

## 2017-07-14 DIAGNOSIS — I10 Essential (primary) hypertension: Secondary | ICD-10-CM | POA: Diagnosis not present

## 2017-07-14 DIAGNOSIS — F324 Major depressive disorder, single episode, in partial remission: Secondary | ICD-10-CM | POA: Diagnosis not present

## 2017-07-19 ENCOUNTER — Ambulatory Visit (INDEPENDENT_AMBULATORY_CARE_PROVIDER_SITE_OTHER): Payer: Medicare Other | Admitting: Orthopedic Surgery

## 2017-07-19 ENCOUNTER — Encounter (INDEPENDENT_AMBULATORY_CARE_PROVIDER_SITE_OTHER): Payer: Self-pay | Admitting: Orthopedic Surgery

## 2017-07-19 VITALS — Ht 63.0 in | Wt 280.0 lb

## 2017-07-19 DIAGNOSIS — M75102 Unspecified rotator cuff tear or rupture of left shoulder, not specified as traumatic: Secondary | ICD-10-CM

## 2017-07-19 DIAGNOSIS — M12812 Other specific arthropathies, not elsewhere classified, left shoulder: Secondary | ICD-10-CM | POA: Insufficient documentation
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
## 2017-10-13 DIAGNOSIS — H524 Presbyopia: Secondary | ICD-10-CM | POA: Diagnosis not present

## 2017-10-13 DIAGNOSIS — E109 Type 1 diabetes mellitus without complications: Secondary | ICD-10-CM | POA: Diagnosis not present

## 2017-10-13 DIAGNOSIS — H2513 Age-related nuclear cataract, bilateral: Secondary | ICD-10-CM | POA: Diagnosis not present

## 2017-10-25 DIAGNOSIS — H2511 Age-related nuclear cataract, right eye: Secondary | ICD-10-CM | POA: Diagnosis not present

## 2017-10-25 DIAGNOSIS — H25811 Combined forms of age-related cataract, right eye: Secondary | ICD-10-CM | POA: Diagnosis not present

## 2017-12-06 DIAGNOSIS — H25812 Combined forms of age-related cataract, left eye: Secondary | ICD-10-CM | POA: Diagnosis not present

## 2017-12-06 DIAGNOSIS — H2512 Age-related nuclear cataract, left eye: Secondary | ICD-10-CM | POA: Diagnosis not present

## 2017-12-06 DIAGNOSIS — H25042 Posterior subcapsular polar age-related cataract, left eye: Secondary | ICD-10-CM | POA: Diagnosis not present

## 2018-01-17 DIAGNOSIS — I480 Paroxysmal atrial fibrillation: Secondary | ICD-10-CM | POA: Diagnosis not present

## 2018-01-17 DIAGNOSIS — F5081 Binge eating disorder: Secondary | ICD-10-CM | POA: Diagnosis not present

## 2018-01-17 DIAGNOSIS — Z23 Encounter for immunization: Secondary | ICD-10-CM | POA: Diagnosis not present

## 2018-01-17 DIAGNOSIS — Z0000 Encounter for general adult medical examination without abnormal findings: Secondary | ICD-10-CM | POA: Diagnosis not present

## 2018-01-17 DIAGNOSIS — I10 Essential (primary) hypertension: Secondary | ICD-10-CM | POA: Diagnosis not present

## 2018-01-17 DIAGNOSIS — F322 Major depressive disorder, single episode, severe without psychotic features: Secondary | ICD-10-CM | POA: Diagnosis not present

## 2018-01-17 DIAGNOSIS — E785 Hyperlipidemia, unspecified: Secondary | ICD-10-CM | POA: Diagnosis not present

## 2018-01-17 DIAGNOSIS — E1169 Type 2 diabetes mellitus with other specified complication: Secondary | ICD-10-CM | POA: Diagnosis not present

## 2018-01-17 DIAGNOSIS — Z1211 Encounter for screening for malignant neoplasm of colon: Secondary | ICD-10-CM | POA: Diagnosis not present

## 2018-02-20 DIAGNOSIS — J069 Acute upper respiratory infection, unspecified: Secondary | ICD-10-CM | POA: Diagnosis not present

## 2018-03-16 ENCOUNTER — Encounter (HOSPITAL_COMMUNITY): Payer: Self-pay

## 2018-03-16 ENCOUNTER — Emergency Department (HOSPITAL_COMMUNITY): Payer: Medicare Other

## 2018-03-16 ENCOUNTER — Observation Stay (HOSPITAL_COMMUNITY)
Admission: EM | Admit: 2018-03-16 | Discharge: 2018-03-17 | Disposition: A | Discharge status: Home / Self Care | Payer: Medicare Other | Attending: Internal Medicine | Admitting: Internal Medicine

## 2018-03-16 ENCOUNTER — Other Ambulatory Visit: Payer: Self-pay

## 2018-03-16 DIAGNOSIS — E785 Hyperlipidemia, unspecified: Secondary | ICD-10-CM | POA: Diagnosis not present

## 2018-03-16 DIAGNOSIS — I10 Essential (primary) hypertension: Secondary | ICD-10-CM | POA: Insufficient documentation

## 2018-03-16 DIAGNOSIS — R0902 Hypoxemia: Secondary | ICD-10-CM | POA: Diagnosis not present

## 2018-03-16 DIAGNOSIS — R0602 Shortness of breath: Secondary | ICD-10-CM | POA: Diagnosis not present

## 2018-03-16 DIAGNOSIS — R739 Hyperglycemia, unspecified: Secondary | ICD-10-CM

## 2018-03-16 DIAGNOSIS — Z6841 Body Mass Index (BMI) 40.0 and over, adult: Secondary | ICD-10-CM | POA: Diagnosis not present

## 2018-03-16 DIAGNOSIS — R2689 Other abnormalities of gait and mobility: Secondary | ICD-10-CM | POA: Insufficient documentation

## 2018-03-16 DIAGNOSIS — F909 Attention-deficit hyperactivity disorder, unspecified type: Secondary | ICD-10-CM | POA: Insufficient documentation

## 2018-03-16 DIAGNOSIS — F418 Other specified anxiety disorders: Secondary | ICD-10-CM | POA: Insufficient documentation

## 2018-03-16 DIAGNOSIS — Z9114 Patient's other noncompliance with medication regimen: Secondary | ICD-10-CM | POA: Insufficient documentation

## 2018-03-16 DIAGNOSIS — R000 Tachycardia, unspecified: Secondary | ICD-10-CM | POA: Diagnosis not present

## 2018-03-16 DIAGNOSIS — K219 Gastro-esophageal reflux disease without esophagitis: Secondary | ICD-10-CM | POA: Insufficient documentation

## 2018-03-16 DIAGNOSIS — E1165 Type 2 diabetes mellitus with hyperglycemia: Secondary | ICD-10-CM | POA: Insufficient documentation

## 2018-03-16 DIAGNOSIS — E119 Type 2 diabetes mellitus without complications: Secondary | ICD-10-CM | POA: Diagnosis not present

## 2018-03-16 DIAGNOSIS — Z9989 Dependence on other enabling machines and devices: Secondary | ICD-10-CM

## 2018-03-16 DIAGNOSIS — G4733 Obstructive sleep apnea (adult) (pediatric): Secondary | ICD-10-CM

## 2018-03-16 DIAGNOSIS — R7989 Other specified abnormal findings of blood chemistry: Secondary | ICD-10-CM

## 2018-03-16 DIAGNOSIS — R52 Pain, unspecified: Secondary | ICD-10-CM | POA: Diagnosis not present

## 2018-03-16 DIAGNOSIS — R0789 Other chest pain: Secondary | ICD-10-CM | POA: Diagnosis present

## 2018-03-16 DIAGNOSIS — E7800 Pure hypercholesterolemia, unspecified: Secondary | ICD-10-CM | POA: Insufficient documentation

## 2018-03-16 DIAGNOSIS — I480 Paroxysmal atrial fibrillation: Secondary | ICD-10-CM | POA: Diagnosis not present

## 2018-03-16 DIAGNOSIS — I447 Left bundle-branch block, unspecified: Secondary | ICD-10-CM | POA: Diagnosis not present

## 2018-03-16 DIAGNOSIS — Z79899 Other long term (current) drug therapy: Secondary | ICD-10-CM | POA: Diagnosis not present

## 2018-03-16 DIAGNOSIS — R778 Other specified abnormalities of plasma proteins: Secondary | ICD-10-CM

## 2018-03-16 DIAGNOSIS — R070 Pain in throat: Secondary | ICD-10-CM | POA: Insufficient documentation

## 2018-03-16 DIAGNOSIS — I4891 Unspecified atrial fibrillation: Secondary | ICD-10-CM | POA: Diagnosis not present

## 2018-03-16 DIAGNOSIS — E1142 Type 2 diabetes mellitus with diabetic polyneuropathy: Secondary | ICD-10-CM | POA: Diagnosis not present

## 2018-03-16 HISTORY — DX: Other complications of anesthesia, initial encounter: T88.59XA

## 2018-03-16 HISTORY — DX: Unspecified urinary incontinence: R32

## 2018-03-16 HISTORY — DX: Adverse effect of unspecified anesthetic, initial encounter: T41.45XA

## 2018-03-16 LAB — HEMOGLOBIN A1C
Hgb A1c MFr Bld: 6.5 % — ABNORMAL HIGH (ref 4.8–5.6)
Mean Plasma Glucose: 139.85 mg/dL

## 2018-03-16 LAB — CBC
HCT: 47.6 % — ABNORMAL HIGH (ref 36.0–46.0)
Hemoglobin: 14.8 g/dL (ref 12.0–15.0)
MCH: 28.4 pg (ref 26.0–34.0)
MCHC: 31.1 g/dL (ref 30.0–36.0)
MCV: 91.4 fL (ref 80.0–100.0)
Platelets: 249 10*3/uL (ref 150–400)
RBC: 5.21 MIL/uL — ABNORMAL HIGH (ref 3.87–5.11)
RDW: 13.8 % (ref 11.5–15.5)
WBC: 9.6 10*3/uL (ref 4.0–10.5)
nRBC: 0 % (ref 0.0–0.2)

## 2018-03-16 LAB — COMPREHENSIVE METABOLIC PANEL
ALT: 23 U/L (ref 0–44)
AST: 25 U/L (ref 15–41)
Albumin: 3.6 g/dL (ref 3.5–5.0)
Alkaline Phosphatase: 68 U/L (ref 38–126)
Anion gap: 16 — ABNORMAL HIGH (ref 5–15)
BUN: 18 mg/dL (ref 8–23)
CO2: 24 mmol/L (ref 22–32)
Calcium: 9.1 mg/dL (ref 8.9–10.3)
Chloride: 99 mmol/L (ref 98–111)
Creatinine, Ser: 0.74 mg/dL (ref 0.44–1.00)
GFR calc Af Amer: >60 mL/min (ref >60)
GFR calc non Af Amer: >60 mL/min (ref >60)
Glucose, Bld: 273 mg/dL — ABNORMAL HIGH (ref 70–99)
Potassium: 3.5 mmol/L (ref 3.5–5.1)
Sodium: 139 mmol/L (ref 135–145)
Total Bilirubin: 0.8 mg/dL (ref 0.3–1.2)
Total Protein: 6.6 g/dL (ref 6.5–8.1)

## 2018-03-16 LAB — GLUCOSE, CAPILLARY
Glucose-Capillary: 244 mg/dL — ABNORMAL HIGH (ref 70–99)
Glucose-Capillary: 97 mg/dL (ref 70–99)
Glucose-Capillary: 156 mg/dL — ABNORMAL HIGH (ref 70–99)

## 2018-03-16 LAB — CBG MONITORING, ED: Glucose-Capillary: 244 mg/dL — ABNORMAL HIGH (ref 70–99)

## 2018-03-16 LAB — TROPONIN I
Troponin I: 0.04 ng/mL (ref <0.03)
Troponin I: 0.18 ng/mL (ref <0.03)
Troponin I: 0.25 ng/mL (ref <0.03)

## 2018-03-16 LAB — BRAIN NATRIURETIC PEPTIDE: B Natriuretic Peptide: 61.8 pg/mL (ref 0.0–100.0)

## 2018-03-16 MED ORDER — DILTIAZEM LOAD VIA INFUSION
10.0000 mg | Freq: Once | INTRAVENOUS | Status: AC
  Administered 2018-03-16: 10 mg via INTRAVENOUS
  Filled 2018-03-16: qty 10

## 2018-03-16 MED ORDER — METOPROLOL SUCCINATE ER 100 MG PO TB24
100.0000 mg | ORAL_TABLET | Freq: Every morning | ORAL | Status: DC
  Administered 2018-03-17: 100 mg via ORAL
  Filled 2018-03-16: qty 1

## 2018-03-16 MED ORDER — INSULIN ASPART 100 UNIT/ML ~~LOC~~ SOLN
0.0000 [IU] | Freq: Three times a day (TID) | SUBCUTANEOUS | Status: DC
  Administered 2018-03-16: 7 [IU] via SUBCUTANEOUS
  Administered 2018-03-17: 3 [IU] via SUBCUTANEOUS

## 2018-03-16 MED ORDER — RIVAROXABAN 20 MG PO TABS: 20.0000 mg | ORAL_TABLET | Freq: Every day | ORAL | Status: DC

## 2018-03-16 MED ORDER — NITROGLYCERIN 0.4 MG SL SUBL: 0.4000 mg | SUBLINGUAL_TABLET | SUBLINGUAL | Status: DC | PRN

## 2018-03-16 MED ORDER — ASPIRIN EC 81 MG PO TBEC
81.0000 mg | DELAYED_RELEASE_TABLET | Freq: Every day | ORAL | Status: DC
  Administered 2018-03-16 – 2018-03-17 (×2): 81 mg via ORAL
  Filled 2018-03-16 (×2): qty 1

## 2018-03-16 MED ORDER — ROPINIROLE HCL 0.5 MG PO TABS
0.5000 mg | ORAL_TABLET | Freq: Every day | ORAL | Status: DC
  Administered 2018-03-16: 0.5 mg via ORAL
  Filled 2018-03-16: qty 1

## 2018-03-16 MED ORDER — ESCITALOPRAM OXALATE 20 MG PO TABS
20.0000 mg | ORAL_TABLET | Freq: Every evening | ORAL | Status: DC
  Administered 2018-03-16: 20 mg via ORAL
  Filled 2018-03-16: qty 1

## 2018-03-16 MED ORDER — ACETAMINOPHEN 325 MG PO TABS
650.0000 mg | ORAL_TABLET | ORAL | Status: DC | PRN
  Administered 2018-03-16: 650 mg via ORAL
  Filled 2018-03-16: qty 2

## 2018-03-16 MED ORDER — RIVAROXABAN 20 MG PO TABS
20.0000 mg | ORAL_TABLET | Freq: Once | ORAL | Status: AC
  Administered 2018-03-16: 20 mg via ORAL
  Filled 2018-03-16: qty 1

## 2018-03-16 MED ORDER — SODIUM CHLORIDE 0.9 % IV BOLUS
500.0000 mL | Freq: Once | INTRAVENOUS | Status: AC
  Administered 2018-03-16: 500 mL via INTRAVENOUS

## 2018-03-16 MED ORDER — BUPROPION HCL ER (XL) 150 MG PO TB24
150.0000 mg | ORAL_TABLET | Freq: Every morning | ORAL | Status: DC
  Administered 2018-03-17: 150 mg via ORAL
  Filled 2018-03-16: qty 1

## 2018-03-16 MED ORDER — PANTOPRAZOLE SODIUM 40 MG IV SOLR
40.0000 mg | Freq: Once | INTRAVENOUS | Status: AC
  Administered 2018-03-16: 40 mg via INTRAVENOUS
  Filled 2018-03-16: qty 40

## 2018-03-16 MED ORDER — DILTIAZEM HCL-DEXTROSE 100-5 MG/100ML-% IV SOLN (PREMIX)
5.0000 mg/h | INTRAVENOUS | Status: DC
  Administered 2018-03-16: 5 mg/h via INTRAVENOUS
  Filled 2018-03-16: qty 100

## 2018-03-16 MED ORDER — ONDANSETRON HCL 4 MG/2ML IJ SOLN: 4.0000 mg | Freq: Four times a day (QID) | INTRAMUSCULAR | Status: DC | PRN

## 2018-03-16 MED ORDER — ASPIRIN 81 MG PO CHEW: 324.0000 mg | CHEWABLE_TABLET | Freq: Once | ORAL | Status: DC

## 2018-03-16 MED ORDER — LORAZEPAM 1 MG PO TABS
1.0000 mg | ORAL_TABLET | Freq: Every day | ORAL | Status: DC | PRN
  Administered 2018-03-16: 1 mg via ORAL
  Filled 2018-03-16: qty 1

## 2018-03-16 MED ORDER — INSULIN ASPART 100 UNIT/ML ~~LOC~~ SOLN: 0.0000 [IU] | Freq: Every day | SUBCUTANEOUS | Status: DC

## 2018-03-16 MED ORDER — IRBESARTAN 300 MG PO TABS
300.0000 mg | ORAL_TABLET | Freq: Every morning | ORAL | Status: DC
  Administered 2018-03-17: 300 mg via ORAL
  Filled 2018-03-16: qty 1

## 2018-03-16 MED ORDER — PANTOPRAZOLE SODIUM 40 MG PO TBEC
40.0000 mg | DELAYED_RELEASE_TABLET | Freq: Every day | ORAL | Status: DC
  Administered 2018-03-16 – 2018-03-17 (×2): 40 mg via ORAL
  Filled 2018-03-16 (×2): qty 1

## 2018-03-16 MED ORDER — PRAVASTATIN SODIUM 40 MG PO TABS
40.0000 mg | ORAL_TABLET | Freq: Every day | ORAL | Status: DC
  Administered 2018-03-16: 40 mg via ORAL
  Filled 2018-03-16: qty 1

## 2018-03-16 NOTE — ED Provider Notes (Signed)
Medical screening examination/treatment/procedure(s) were conducted as a shared visit with non-physician practitioner(s) and myself.  I personally evaluated the patient during the encounter.  EKG Interpretation  Date/Time:  Thursday March 16 2018 06:00:36 EST Ventricular Rate:  153 PR Interval:    QRS Duration: 129 QT Interval:  340 QTC Calculation: 543 R Axis:   -63 Text Interpretation:  Atrial flutter with predominant 2:1 AV block Left bundle branch block Has had  similar EKG 12/02/12 A flutter new compared to previous EKG in 2015 Confirmed by Pryor Curia [phone]) on 03/16/2018 6:44:00 AM     Patient is a 61 year old female with history of atrial flutter/atrial fibrillation on metoprolol and Xarelto who presents to the emergency department with throat pain, chest tightness, shortness of breath that started 2 to 3 hours ago while shopping online at home.  States she forgot to take her metoprolol yesterday and took it after her symptoms started.  Patient in atrial flutter with rapid rate here with left bundle branch block.  EKG with similar appearance in September 2014.  Diltiazem drip started.  Cardiac labs ordered.  Patient will need admission.   Ellie Spickler, Delice Bison, DO 03/16/18 [phone]

## 2018-03-16 NOTE — ED Triage Notes (Addendum)
Patient BIB [facility] for Afib. Patient states she got home from shopping at 0300 and started to have "throat pain". Releaized she never took her BP or Afib medications and her heart started to feel "funny". Patient also c/o SOB and heartburn. Denies chest pain, nausea, vomiting, abdominal pain, fever, or dysuria.  Given 324 mg Aspirin by EMS.    HR 70-120 BP 139/70
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
## 2018-03-16 NOTE — Progress Notes (Signed)
Notified As[place], RT pt will need cpap this evening.
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
## 2018-03-16 NOTE — ED Notes (Signed)
sats are dipping into low 90's and high 80's - placed on 2L [place]

## 2018-03-17 DIAGNOSIS — I480 Paroxysmal atrial fibrillation: Secondary | ICD-10-CM | POA: Diagnosis not present

## 2018-03-17 DIAGNOSIS — I10 Essential (primary) hypertension: Secondary | ICD-10-CM | POA: Diagnosis not present

## 2018-03-17 LAB — TROPONIN I: Troponin I: 0.12 ng/mL (ref <0.03)

## 2018-03-17 LAB — GLUCOSE, CAPILLARY: Glucose-Capillary: 178 mg/dL — ABNORMAL HIGH (ref 70–99)

## 2018-03-17 LAB — HIV ANTIBODY (ROUTINE TESTING W REFLEX): HIV Screen 4th Generation wRfx: NONREACTIVE

## 2018-03-17 NOTE — Discharge Summary (Signed)
Physician Discharge Summary  Kristin [name] IDP:[id number] DOB: [birth year] DOA: 03/16/2018  PCP: Harlan Stains, MD  Admit date: 03/16/2018 Discharge date: 03/17/2018  Admitted From: home Discharge disposition: home   Recommendations for Outpatient Follow-Up:   Neurology referral for memory issues  Discharge Diagnosis:   Principal Problem:   Atrial fibrillation [facility]) Active Problems:   Type 2 diabetes mellitus ([place])   Morbid obesity ([place])   OSA on CPAP   Hypercholesterolemia   Essential hypertension    Discharge Condition: Improved.  Diet recommendation: Low sodium, heart healthy.  Carbohydrate-modified  Wound care: None.  Code status: Full.   History of Present Illness:   Kristin [name] is a 62 y.o. female with medical history significant of Type II, paroxysmal Afibon Xarelto and metoprolol, LBBB, HTN, HLD, ADD, nephrolithiasis, GERD, detrusor muscle instability of bladder presenting with CP and SOB.  The patient reports h/o afib in 2014 - she woke up that day and her throat was killing her, aching, not like heart burn.  Since she had heard that jaw pain was associated with CAD and "she has been to medical school for office" and they studied about afib and she went to the fire department and they confirmed that she was in afib.  This week, she has had 2-3 days of throat pain and her heart "wasn't right but it wouldn't last" and so she decided to call 911.  By the time the ambulance got there "I was feeling pretty rough."  Her throat was killing her and she just felt horrible.  She wasn't thinking clearly.  Denies CP.  She has been SOB here in the ER, just feels like she can't take a full breath.   She has missed some of her doses of medication - she missed one yesterday and takes them at inconsistent times.  She has a very crazy schedule and forgets to take them sometimes - she went shopping and didn't get home from [place] until 3AM and so took metoprolol then.   Symptoms started about 4AM.  Poor balance in the dark.     Hospital Course by Problem:   Afib paroxsymal-- back in sinus at time of d/c -Patient with known h/o afib presented with afib with RVR -She acknowledges medication non-compliance, which is likely the source of her RVR -troponins elevated due to heart strain from a fib -Encourage improved compliance  HTN -Continue Toprol XL, as above, and Avapro -Encourage improved compliance  HLD -Continue Pravachol  DM -resume home meds  Morbid obesity Estimated body mass index is 46.85 kg/m as calculated from the following:   Height as of this encounter: 5' 4"  (1.626 m).   Weight as of this encounter: 123.8 kg.  OSA on CPAP -Continue CPAP  Depression/Anxiety/ADHD -Continue Wellbutrin, Ativan, Requip     Medical Consultants:      Discharge Exam:   Vitals:   03/17/18 0747 03/17/18 1058  BP: (!) 131/52 138/76  Pulse: 70 73  Resp: 17 12  Temp:  98.8 F (37.1 C)  SpO2: 93% 94%   Vitals:   03/16/18 2200 03/17/18 0200 03/17/18 0747 03/17/18 1058  BP: (!) 131/57 131/72 (!) 131/52 138/76  Pulse: 70 72 70 73  Resp: 16 16 17 12   Temp: 98 F (36.7 C) 98.1 F (36.7 C)  98.8 F (37.1 C)  TempSrc: Oral Oral  Oral  SpO2: 94% 93% 93% 94%  Weight:      Height:  General exam: Appears calm and comfortable.   The results of significant diagnostics from this hospitalization (including imaging, microbiology, ancillary and laboratory) are listed below for reference.     Procedures and Diagnostic Studies:   Dg Chest 2 View  Result Date: 03/16/2018 CLINICAL DATA:  Atrial fibrillation. Palpitations. Throat pain. Shortness of breath and heartburn. EXAM: CHEST - 2 VIEW COMPARISON:  12/02/2012 FINDINGS: Cardiac enlargement. No vascular congestion or edema. No focal consolidation. No blunting of costophrenic angles. No pneumothorax. Mediastinal contours appear intact. IMPRESSION: Cardiac enlargement. No evidence of  active pulmonary disease. Electronically Signed   By: Lucienne Capers M.D.   On: 03/16/2018 06:58     Labs:   Basic Metabolic Panel: Recent Labs  Lab 03/16/18 0606  NA 139  K 3.5  CL 99  CO2 24  GLUCOSE 273*  BUN 18  CREATININE 0.74  CALCIUM 9.1   GFR Estimated Creatinine Clearance: 95.9 mL/min (by C-G formula based on SCr of 0.74 mg/dL). Liver Function Tests: Recent Labs  Lab 03/16/18 0606  AST 25  ALT 23  ALKPHOS 68  BILITOT 0.8  PROT 6.6  ALBUMIN 3.6   No results for input(s): LIPASE, AMYLASE in the last 168 hours. No results for input(s): AMMONIA in the last 168 hours. Coagulation profile No results for input(s): INR, PROTIME in the last 168 hours.  CBC: Recent Labs  Lab 03/16/18 0606  WBC 9.6  HGB 14.8  HCT 47.6*  MCV 91.4  PLT 249   Cardiac Enzymes: Recent Labs  Lab 03/16/18 0606 03/16/18 1235 03/16/18 1820 03/17/18 0831  TROPONINI 0.04* 0.18* 0.25* 0.12*   BNP: Invalid input(s): POCBNP CBG: Recent Labs  Lab 03/16/18 0731 03/16/18 1200 03/16/18 1648 03/16/18 2114 03/17/18 0746  GLUCAP 244* 244* 97 156* 178*   D-Dimer No results for input(s): DDIMER in the last 72 hours. Hgb A1c Recent Labs    03/16/18 1235  HGBA1C 6.5*   Lipid Profile No results for input(s): CHOL, HDL, LDLCALC, TRIG, CHOLHDL, LDLDIRECT in the last 72 hours. Thyroid function studies No results for input(s): TSH, T4TOTAL, T3FREE, THYROIDAB in the last 72 hours.  Invalid input(s): FREET3 Anemia work up No results for input(s): VITAMINB12, FOLATE, FERRITIN, TIBC, IRON, RETICCTPCT in the last 72 hours. Microbiology No results found for this or any previous visit (from the past 240 hour(s)).   Discharge Instructions:   Discharge Instructions    Diet - low sodium heart healthy   Complete by:  As directed    Discharge instructions   Complete by:  As directed    Would set alarm on phone for medications so you do not miss doses.  Xarelto is especially  important to avoid strokes from atrial fibrillation   Increase activity slowly   Complete by:  As directed      Allergies as of 03/17/2018      Reactions   Clinoril [sulindac] Other (See Comments)   Yeast infection/itching   Dilaudid [hydromorphone Hcl] Other (See Comments)   Changed respirations   Keflex [cephalexin] Other (See Comments)   Yeast infection/itching   Sulfa Antibiotics Other (See Comments)   Yeast infection/itching   Tramadol Itching   itching      Medication List    TAKE these medications   buPROPion 150 MG 24 hr tablet Commonly known as:  WELLBUTRIN XL Take 150 mg by mouth every morning.   chlorpheniramine 4 MG tablet Commonly known as:  CHLOR-TRIMETON Take 4 mg by mouth 2 (two) times daily as  needed for allergies.   clotrimazole-betamethasone cream Commonly known as:  LOTRISONE Apply 1 application topically once as needed (rash.). As needed as directed   escitalopram 20 MG tablet Commonly known as:  LEXAPRO Take 20 mg by mouth every evening.   irbesartan 300 MG tablet Commonly known as:  AVAPRO Take 300 mg by mouth every morning.   JARDIANCE 10 MG Tabs tablet Generic drug:  empagliflozin Take 10 mg by mouth daily.   LORazepam 1 MG tablet Commonly known as:  ATIVAN Take 1 mg by mouth daily as needed for anxiety (anxiety).   methylphenidate 10 MG tablet Commonly known as:  RITALIN Take 10 mg by mouth 2 (two) times daily as needed (narcoleptic episodes when driving long distances.).   metoprolol succinate 100 MG 24 hr tablet Commonly known as:  TOPROL-XL Take 100 mg by mouth every morning. Take with or immediately following a meal.   metroNIDAZOLE 0.75 % cream Commonly known as:  METROCREAM Apply 1 application topically daily as needed (rosacea).   omeprazole 20 MG capsule Commonly known as:  PRILOSEC Take 20 mg by mouth every evening.   POLY-IRON 150 150 MG capsule Generic drug:  iron polysaccharides Take 150 mg by mouth 2 (two)  times daily.   pravastatin 40 MG tablet Commonly known as:  PRAVACHOL Take 40 mg by mouth at bedtime.   rOPINIRole 0.5 MG tablet Commonly known as:  REQUIP Take 1 tablet by mouth at bedtime. Take 1-3 hours prior to bedtime   Vitamin D3 50 MCG (2000 UT) capsule Take 5,000 Units by mouth daily.   XARELTO 20 MG Tabs tablet Generic drug:  rivaroxaban TAKE 1 TABLET BY MOUTH DAILY WITH SUPPER What changed:  See the new instructions.      Follow-up Information    Harlan Stains, MD Follow up.   Specialty:  Family Medicine Contact information: [address] [place] [address] [phone]            Time coordinating discharge: 25 min  Signed:  Geradine Girt DO  [facility] 03/17/2018, 5:02 PM

## 2018-03-29 DIAGNOSIS — I340 Nonrheumatic mitral (valve) insufficiency: Secondary | ICD-10-CM

## 2018-03-29 HISTORY — DX: Nonrheumatic mitral (valve) insufficiency: I34.0

## 2018-03-30 ENCOUNTER — Telehealth: Payer: Self-pay | Admitting: *Deleted

## 2018-03-30 NOTE — Telephone Encounter (Signed)
From: [name]  Sent: 03/30/2018  2:21 PM EST  To: [facility] Triage  Subject: Visit Follow-Up Question               I have an appt on 04/17/18. I had Avib and enlarged parts of heart on 12/19. I am have been having flutters of my heart a couple if times a day since then. I am holding fluid. My ankles and fingers are swollen and I feel bloated with tight skin. Should I see a Dr. sooner than 1/20 or is it ok to wait almost 3 weeks? I do not know how high my Blood Pressure is. Thanks   Called patient after receiving the above MyChart message.  Pt reports feeling fluttering in her chest every once in a while but at least once per day but nothing like when she was in the hospital with At Fib.  She reports feeling like her skin is tight especially at her feet and ankles.  She has not been weighing daily but notices swelling by the end of the daily.  She states she has been eating "anything and everything" lately and doesn't know why.  Advised to avoid foods high in NA+ and to limit to no more than 2 gms perday (reviewed fast foods, canned foods, prepared foods, deli meats etc)  Advised to limit fluid intake to no more than 2 L daily - (better at 1,500 if possible), advised to elevate feet and legs above the level of her heart and to wear knee high compression stockings daily.  Gave her the name of [employer] in [place] as they will help measure her and determine the correct size for her.  She states she will follow these instructions and call back if swelling doesn't improve.  At this time she does not have any SOB or feeling as if her heart is skipping.  She will call back is s/s change or worsen.  She was grateful for the c/b and information.

## 2018-04-17 ENCOUNTER — Encounter: Payer: Self-pay | Admitting: Cardiology

## 2018-04-17 ENCOUNTER — Ambulatory Visit (INDEPENDENT_AMBULATORY_CARE_PROVIDER_SITE_OTHER): Payer: Medicare Other | Admitting: Cardiology

## 2018-04-17 VITALS — BP 166/90 | HR 61 | Ht 64.0 in | Wt 283.2 lb

## 2018-04-17 DIAGNOSIS — E119 Type 2 diabetes mellitus without complications: Secondary | ICD-10-CM | POA: Diagnosis not present

## 2018-04-17 DIAGNOSIS — Z9989 Dependence on other enabling machines and devices: Secondary | ICD-10-CM

## 2018-04-17 DIAGNOSIS — I10 Essential (primary) hypertension: Secondary | ICD-10-CM

## 2018-04-17 DIAGNOSIS — I480 Paroxysmal atrial fibrillation: Secondary | ICD-10-CM

## 2018-04-17 DIAGNOSIS — G4733 Obstructive sleep apnea (adult) (pediatric): Secondary | ICD-10-CM
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
## 2018-04-17 NOTE — Patient Instructions (Signed)
Medication Instructions:  The current medical regimen is effective;  continue present plan and medications.  If you need a refill on your cardiac medications before your next appointment, please call your pharmacy.   Your physician has requested that you have an echocardiogram. Echocardiography is a painless test that uses sound waves to create images of your heart. It provides your doctor with information about the size and shape of your heart and how well your heart's chambers and valves are working. This procedure takes approximately one hour. There are no restrictions for this procedure.  Follow-Up: At [facility] you and your health needs are our priority.  As part of our continuing mission to provide you with exceptional heart care, we have created designated Provider Care Teams.  These Care Teams include your primary Cardiologist (physician) and Advanced Practice Providers (APPs -  Physician Assistants and Nurse Practitioners) who all work together to provide you with the care you need, when you need it. You will need a follow up appointment in 6 months with Cecilie Kicks, NP and Dr Marlou Porch 12 months.  Please call our office 2 months in advance to schedule this appointment.  You may see Candee Furbish, MD or one of the following Advanced Practice Providers on your designated Care Team:   Truitt Merle, NP Cecilie Kicks, NP . Kathyrn Drown, NP  Thank you for choosing [facility]!!

## 2018-04-18 ENCOUNTER — Encounter: Payer: Self-pay | Admitting: Cardiology

## 2018-04-26 ENCOUNTER — Ambulatory Visit (HOSPITAL_COMMUNITY): Payer: Medicare Other | Attending: Cardiology

## 2018-04-26 DIAGNOSIS — I480 Paroxysmal atrial fibrillation: Secondary | ICD-10-CM | POA: Diagnosis not present

## 2018-06-14 DIAGNOSIS — F321 Major depressive disorder, single episode, moderate: Secondary | ICD-10-CM | POA: Diagnosis not present

## 2018-06-29 DIAGNOSIS — F321 Major depressive disorder, single episode, moderate: Secondary | ICD-10-CM | POA: Diagnosis not present

## 2018-07-13 DIAGNOSIS — F321 Major depressive disorder, single episode, moderate: Secondary | ICD-10-CM | POA: Diagnosis not present

## 2018-08-03 DIAGNOSIS — F321 Major depressive disorder, single episode, moderate: Secondary | ICD-10-CM | POA: Diagnosis not present

## 2018-08-10 DIAGNOSIS — F321 Major depressive disorder, single episode, moderate: Secondary | ICD-10-CM | POA: Diagnosis not present

## 2018-08-28 DIAGNOSIS — F3341 Major depressive disorder, recurrent, in partial remission: Secondary | ICD-10-CM | POA: Diagnosis not present

## 2018-08-28 DIAGNOSIS — E1169 Type 2 diabetes mellitus with other specified complication: Secondary | ICD-10-CM | POA: Diagnosis not present

## 2018-08-28 DIAGNOSIS — R600 Localized edema: Secondary | ICD-10-CM | POA: Diagnosis not present

## 2018-08-28 DIAGNOSIS — I480 Paroxysmal atrial fibrillation: Secondary | ICD-10-CM | POA: Diagnosis not present

## 2018-08-28 DIAGNOSIS — I10 Essential (primary) hypertension: Secondary | ICD-10-CM | POA: Diagnosis not present

## 2018-08-28 DIAGNOSIS — E785 Hyperlipidemia, unspecified: Secondary | ICD-10-CM | POA: Diagnosis not present

## 2018-09-07 DIAGNOSIS — F321 Major depressive disorder, single episode, moderate: Secondary | ICD-10-CM | POA: Diagnosis not present

## 2018-09-16 ENCOUNTER — Other Ambulatory Visit: Payer: Self-pay

## 2018-09-16 ENCOUNTER — Emergency Department (HOSPITAL_COMMUNITY)
Admission: EM | Admit: 2018-09-16 | Discharge: 2018-09-16 | Disposition: A | Discharge status: Home / Self Care | Payer: Medicare Other | Attending: Emergency Medicine | Admitting: Emergency Medicine

## 2018-09-16 ENCOUNTER — Encounter (HOSPITAL_COMMUNITY): Payer: Self-pay | Admitting: Emergency Medicine

## 2018-09-16 ENCOUNTER — Emergency Department (HOSPITAL_COMMUNITY): Payer: Medicare Other

## 2018-09-16 ENCOUNTER — Emergency Department (HOSPITAL_BASED_OUTPATIENT_CLINIC_OR_DEPARTMENT_OTHER): Payer: Medicare Other

## 2018-09-16 DIAGNOSIS — S8012XA Contusion of left lower leg, initial encounter: Secondary | ICD-10-CM | POA: Diagnosis not present

## 2018-09-16 DIAGNOSIS — M79662 Pain in left lower leg: Secondary | ICD-10-CM | POA: Diagnosis not present

## 2018-09-16 DIAGNOSIS — R52 Pain, unspecified: Secondary | ICD-10-CM | POA: Diagnosis not present

## 2018-09-16 DIAGNOSIS — Y92009 Unspecified place in unspecified non-institutional (private) residence as the place of occurrence of the external cause: Secondary | ICD-10-CM | POA: Diagnosis not present

## 2018-09-16 DIAGNOSIS — W19XXXA Unspecified fall, initial encounter: Secondary | ICD-10-CM | POA: Insufficient documentation

## 2018-09-16 DIAGNOSIS — M79605 Pain in left leg: Secondary | ICD-10-CM

## 2018-09-16 DIAGNOSIS — I10 Essential (primary) hypertension: Secondary | ICD-10-CM | POA: Insufficient documentation

## 2018-09-16 DIAGNOSIS — Z7901 Long term (current) use of anticoagulants: Secondary | ICD-10-CM | POA: Diagnosis not present

## 2018-09-16 DIAGNOSIS — M7989 Other specified soft tissue disorders: Secondary | ICD-10-CM | POA: Diagnosis not present

## 2018-09-16 DIAGNOSIS — E119 Type 2 diabetes mellitus without complications: Secondary | ICD-10-CM | POA: Insufficient documentation

## 2018-09-16 DIAGNOSIS — Z7984 Long term (current) use of oral hypoglycemic drugs: Secondary | ICD-10-CM | POA: Insufficient documentation

## 2018-09-16 DIAGNOSIS — Y999 Unspecified external cause status: Secondary | ICD-10-CM | POA: Insufficient documentation

## 2018-09-16 DIAGNOSIS — Z79899 Other long term (current) drug therapy: Secondary | ICD-10-CM | POA: Insufficient documentation

## 2018-09-16 DIAGNOSIS — S8992XA Unspecified injury of left lower leg, initial encounter: Secondary | ICD-10-CM | POA: Diagnosis not present

## 2018-09-16 DIAGNOSIS — Y939 Activity, unspecified: Secondary | ICD-10-CM | POA: Diagnosis not present

## 2018-09-16 NOTE — ED Triage Notes (Signed)
Pt had a mechanical fall on Thursday, did not hit hwad. She is on eliqious. She has bruising and swelling to front of left leg. Ambulatory to bathroom.

## 2018-09-16 NOTE — Progress Notes (Signed)
VASCULAR LAB PRELIMINARY  PRELIMINARY  PRELIMINARY  PRELIMINARY  Left lower extremity venous duplex completed.    Preliminary report:  See CV proc for preliminary results.   Gave report to Providence Lanius, PA-c  Baptiste Littler, RVT 09/16/2018, 6:29 PM
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
## 2018-09-16 NOTE — Discharge Instructions (Signed)
You can take 1000 mg of Tylenol.  Do not exceed 4000 mg of Tylenol a day.  Follow the RICE (Rest, Ice, Compression, Elevation) protocol as directed.   Follow-up with your primary care doctor.  Return the emergency department for any worsening pain, redness or swelling that begins to spread, fevers, numbness/weakness or any other worsening or concerning symptoms.

## 2018-09-16 NOTE — ED Notes (Signed)
Patient verbalizes understanding of discharge instructions. Opportunity for questioning and answers were provided. Armband removed by staff, pt discharged from ED.  

## 2018-09-16 NOTE — ED Triage Notes (Signed)
Patient concerned for blood clot in left leg. Patient had a fall on Monday and is now having swelling to left leg. Took tylenol about 2200 yesterday.

## 2018-09-21 DIAGNOSIS — F321 Major depressive disorder, single episode, moderate: Secondary | ICD-10-CM | POA: Diagnosis not present

## 2018-09-22 DIAGNOSIS — E1169 Type 2 diabetes mellitus with other specified complication: Secondary | ICD-10-CM | POA: Diagnosis not present

## 2018-09-22 DIAGNOSIS — T148XXA Other injury of unspecified body region, initial encounter: Secondary | ICD-10-CM | POA: Diagnosis not present

## 2018-09-22 DIAGNOSIS — S8012XD Contusion of left lower leg, subsequent encounter: Secondary | ICD-10-CM | POA: Diagnosis not present

## 2018-09-22 DIAGNOSIS — L03116 Cellulitis of left lower limb: Secondary | ICD-10-CM | POA: Diagnosis not present

## 2018-09-25 DIAGNOSIS — M7989 Other specified soft tissue disorders: Secondary | ICD-10-CM | POA: Diagnosis not present

## 2018-09-25 DIAGNOSIS — S8012XD Contusion of left lower leg, subsequent encounter: Secondary | ICD-10-CM | POA: Diagnosis not present

## 2018-09-25 DIAGNOSIS — W19XXXD Unspecified fall, subsequent encounter: Secondary | ICD-10-CM | POA: Diagnosis not present

## 2018-09-28 ENCOUNTER — Ambulatory Visit (INDEPENDENT_AMBULATORY_CARE_PROVIDER_SITE_OTHER): Payer: Medicare Other | Admitting: Orthopedic Surgery

## 2018-09-28 ENCOUNTER — Encounter: Payer: Self-pay | Admitting: Orthopedic Surgery

## 2018-09-28 ENCOUNTER — Other Ambulatory Visit: Payer: Self-pay

## 2018-09-28 VITALS — Ht 64.0 in | Wt 295.0 lb

## 2018-09-28 DIAGNOSIS — S8012XA Contusion of left lower leg, initial encounter: Secondary | ICD-10-CM | POA: Diagnosis not present

## 2018-09-28 DIAGNOSIS — Z6841 Body Mass Index (BMI) 40.0 and over, adult: Secondary | ICD-10-CM | POA: Diagnosis not present
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
## 2018-10-05 DIAGNOSIS — F321 Major depressive disorder, single episode, moderate: Secondary | ICD-10-CM | POA: Diagnosis not present

## 2018-10-23 DIAGNOSIS — E785 Hyperlipidemia, unspecified: Secondary | ICD-10-CM | POA: Diagnosis not present

## 2018-10-23 DIAGNOSIS — Z79899 Other long term (current) drug therapy: Secondary | ICD-10-CM | POA: Diagnosis not present

## 2018-11-02 DIAGNOSIS — F321 Major depressive disorder, single episode, moderate: Secondary | ICD-10-CM | POA: Diagnosis not present

## 2018-11-10 ENCOUNTER — Telehealth: Payer: Self-pay

## 2018-11-10 NOTE — Telephone Encounter (Signed)
Needs follow up visit. No answer when called.

## 2018-12-14 DIAGNOSIS — E1169 Type 2 diabetes mellitus with other specified complication: Secondary | ICD-10-CM | POA: Diagnosis not present

## 2018-12-14 DIAGNOSIS — F3341 Major depressive disorder, recurrent, in partial remission: Secondary | ICD-10-CM | POA: Diagnosis not present

## 2018-12-14 DIAGNOSIS — E785 Hyperlipidemia, unspecified: Secondary | ICD-10-CM | POA: Diagnosis not present

## 2018-12-14 DIAGNOSIS — I10 Essential (primary) hypertension: Secondary | ICD-10-CM | POA: Diagnosis not present

## 2018-12-14 DIAGNOSIS — I480 Paroxysmal atrial fibrillation: Secondary | ICD-10-CM | POA: Diagnosis not present

## 2019-01-04 ENCOUNTER — Emergency Department (HOSPITAL_COMMUNITY): Payer: Medicare Other

## 2019-01-04 ENCOUNTER — Emergency Department (HOSPITAL_COMMUNITY)
Admission: EM | Admit: 2019-01-04 | Discharge: 2019-01-04 | Disposition: A | Discharge status: Home / Self Care | Payer: Medicare Other | Attending: Emergency Medicine | Admitting: Emergency Medicine

## 2019-01-04 ENCOUNTER — Other Ambulatory Visit: Payer: Self-pay

## 2019-01-04 ENCOUNTER — Encounter (HOSPITAL_COMMUNITY): Payer: Self-pay | Admitting: Emergency Medicine

## 2019-01-04 DIAGNOSIS — R002 Palpitations: Secondary | ICD-10-CM | POA: Diagnosis present

## 2019-01-04 DIAGNOSIS — R0602 Shortness of breath: Secondary | ICD-10-CM | POA: Insufficient documentation

## 2019-01-04 DIAGNOSIS — Z7984 Long term (current) use of oral hypoglycemic drugs: Secondary | ICD-10-CM | POA: Insufficient documentation

## 2019-01-04 DIAGNOSIS — J029 Acute pharyngitis, unspecified: Secondary | ICD-10-CM | POA: Diagnosis not present

## 2019-01-04 DIAGNOSIS — M549 Dorsalgia, unspecified: Secondary | ICD-10-CM | POA: Insufficient documentation

## 2019-01-04 DIAGNOSIS — I10 Essential (primary) hypertension: Secondary | ICD-10-CM | POA: Insufficient documentation

## 2019-01-04 DIAGNOSIS — E1165 Type 2 diabetes mellitus with hyperglycemia: Secondary | ICD-10-CM | POA: Diagnosis not present

## 2019-01-04 DIAGNOSIS — I4891 Unspecified atrial fibrillation: Secondary | ICD-10-CM | POA: Insufficient documentation

## 2019-01-04 DIAGNOSIS — E119 Type 2 diabetes mellitus without complications: Secondary | ICD-10-CM | POA: Insufficient documentation

## 2019-01-04 DIAGNOSIS — Z7901 Long term (current) use of anticoagulants: Secondary | ICD-10-CM | POA: Insufficient documentation

## 2019-01-04 DIAGNOSIS — R000 Tachycardia, unspecified: Secondary | ICD-10-CM | POA: Diagnosis not present

## 2019-01-04 DIAGNOSIS — I499 Cardiac arrhythmia, unspecified: Secondary | ICD-10-CM | POA: Diagnosis not present

## 2019-01-04 DIAGNOSIS — R0902 Hypoxemia: Secondary | ICD-10-CM | POA: Diagnosis not present

## 2019-01-04 DIAGNOSIS — Z79899 Other long term (current) drug therapy: Secondary | ICD-10-CM | POA: Insufficient documentation

## 2019-01-04 LAB — URINALYSIS, ROUTINE W REFLEX MICROSCOPIC
Bacteria, UA: NONE SEEN
Bilirubin Urine: NEGATIVE
Glucose, UA: >=500 mg/dL — AB
Hgb urine dipstick: NEGATIVE
Ketones, ur: NEGATIVE mg/dL
Nitrite: NEGATIVE
Protein, ur: NEGATIVE mg/dL
Specific Gravity, Urine: 1.009 (ref 1.005–1.030)
pH: 6 (ref 5.0–8.0)

## 2019-01-04 LAB — BASIC METABOLIC PANEL
Anion gap: 13 (ref 5–15)
BUN: 16 mg/dL (ref 8–23)
CO2: 23 mmol/L (ref 22–32)
Calcium: 9.1 mg/dL (ref 8.9–10.3)
Chloride: 102 mmol/L (ref 98–111)
Creatinine, Ser: 0.79 mg/dL (ref 0.44–1.00)
GFR calc Af Amer: >60 mL/min (ref >60)
GFR calc non Af Amer: >60 mL/min (ref >60)
Glucose, Bld: 213 mg/dL — ABNORMAL HIGH (ref 70–99)
Potassium: 4.4 mmol/L (ref 3.5–5.1)
Sodium: 138 mmol/L (ref 135–145)

## 2019-01-04 LAB — TROPONIN I (HIGH SENSITIVITY)
Troponin I (High Sensitivity): 41 ng/L — ABNORMAL HIGH (ref <18)
Troponin I (High Sensitivity): 78 ng/L — ABNORMAL HIGH (ref <18)

## 2019-01-04 LAB — MAGNESIUM: Magnesium: 1.8 mg/dL (ref 1.7–2.4)

## 2019-01-04 LAB — CBC
HCT: 51.7 % — ABNORMAL HIGH (ref 36.0–46.0)
Hemoglobin: 16.7 g/dL — ABNORMAL HIGH (ref 12.0–15.0)
MCH: 29.4 pg (ref 26.0–34.0)
MCHC: 32.3 g/dL (ref 30.0–36.0)
MCV: 91 fL (ref 80.0–100.0)
Platelets: 278 10*3/uL (ref 150–400)
RBC: 5.68 MIL/uL — ABNORMAL HIGH (ref 3.87–5.11)
RDW: 14.8 % (ref 11.5–15.5)
WBC: 10.5 10*3/uL (ref 4.0–10.5)
nRBC: 0 % (ref 0.0–0.2)

## 2019-01-04 LAB — BRAIN NATRIURETIC PEPTIDE: B Natriuretic Peptide: 79.1 pg/mL (ref 0.0–100.0)

## 2019-01-04 NOTE — ED Provider Notes (Signed)
Care assumed from S. Caccavale PA-C at shift change pending second troponin.    HPI as below: Kristin [name] is a 63 y.o. female presenting for evaluation of A. fib. Patient states she was awoken from sleep with a sore throat.  This began around 130 this morning.  This is likely her sign that she is in A. fib.  Patient states she has been having some throat discomfort of the possible days, but thought it was her reflux.  As her throat pain was worse tonight, she called EMS, was found to be in A. fib with RVR.  Patient states since her A. fib started tonight, she has been having some mild shortness of breath.  While in route to the hospital, patient developed some left-sided back pain which radiates to her left arm, but this is not new for her.  She has chronic pain in this area.  Patient denies recent fevers, chills, cough, URI symptoms, nausea, vomiting abdominal pain, urinary symptoms, abnormal bowel movements.  Initially patient states she has been taking her metoprolol and Xarelto as prescribed, but then states she missed this morning's dose.  Patient has had 2 previous episodes of A. fib with RVR, the first time she converted on her own, the second time she was on some medicine when she converted.  She has never been cardioverted.  She follows with Dr. Marlou Porch from cardiology. Per EMS, patient received 3 doses of 5 mg of Lopressor over 15 minutes without change in rhythm.  Additional history obtained from chart review.  Patient with a history of chronic cough, diabetes, GERD, hypertension, hyperlipidemia, urine incontinence, A. fib on anticoagulation, left bundle branch block, OSA. "  This provider consulted cardiology who is comfortable with patient following up outpatient in office if delta troponin without significant change.  Physical Exam  BP (!) 162/85   Pulse 81   Temp 98 F (36.7 C) (Oral)   Resp (!) 21   Ht 5' 3"  (1.6 m)   Wt 136.1 kg   SpO2 97%   BMI 53.14 kg/m   Physical Exam   PE: Constitutional: well-developed, well-nourished, no apparent distress HENT: normocephalic, atraumatic. no cervical adenopathy Cardiovascular: normal rate and rhythm, distal pulses intact.regular rhythm Pulmonary/Chest: effort normal; breath sounds clear and equal bilaterally; no wheezes or rales.  SpO2 is 95% on room air, no respiratory distress Abdominal: soft and nontender Musculoskeletal: full ROM, no edema Neurological: alert with goal directed thinking Skin: warm and dry, no rash, no diaphoresis Psychiatric: normal mood and affect, normal behavior    ED Course/Procedures   Results for orders placed or performed during the hospital encounter of 01/04/19 (from the past 24 hour(s))  Basic metabolic panel     Status: Abnormal   Collection Time: 01/04/19  3:14 AM  Result Value Ref Range   Sodium 138 135 - 145 mmol/L   Potassium 4.4 3.5 - 5.1 mmol/L   Chloride 102 98 - 111 mmol/L   CO2 23 22 - 32 mmol/L   Glucose, Bld 213 (H) 70 - 99 mg/dL   BUN 16 8 - 23 mg/dL   Creatinine, Ser 0.79 0.44 - 1.00 mg/dL   Calcium 9.1 8.9 - 10.3 mg/dL   GFR calc non Af Amer >60 >60 mL/min   GFR calc Af Amer >60 >60 mL/min   Anion gap 13 5 - 15  Magnesium     Status: None   Collection Time: 01/04/19  3:14 AM  Result Value Ref Range   Magnesium  1.8 1.7 - 2.4 mg/dL  CBC     Status: Abnormal   Collection Time: 01/04/19  3:14 AM  Result Value Ref Range   WBC 10.5 4.0 - 10.5 K/uL   RBC 5.68 (H) 3.87 - 5.11 MIL/uL   Hemoglobin 16.7 (H) 12.0 - 15.0 g/dL   HCT 51.7 (H) 36.0 - 46.0 %   MCV 91.0 80.0 - 100.0 fL   MCH 29.4 26.0 - 34.0 pg   MCHC 32.3 30.0 - 36.0 g/dL   RDW 14.8 11.5 - 15.5 %   Platelets 278 150 - 400 K/uL   nRBC 0.0 0.0 - 0.2 %  Troponin I (High Sensitivity)     Status: Abnormal   Collection Time: 01/04/19  3:14 AM  Result Value Ref Range   Troponin I (High Sensitivity) 41 (H) <18 ng/L  Brain natriuretic peptide     Status: None   Collection Time: 01/04/19  3:15 AM  Result  Value Ref Range   B Natriuretic Peptide 79.1 0.0 - 100.0 pg/mL  Urinalysis, Routine w reflex microscopic     Status: Abnormal   Collection Time: 01/04/19  3:30 AM  Result Value Ref Range   Color, Urine COLORLESS (A) YELLOW   APPearance CLEAR CLEAR   Specific Gravity, Urine 1.009 1.005 - 1.030   pH 6.0 5.0 - 8.0   Glucose, UA >=500 (A) NEGATIVE mg/dL   Hgb urine dipstick NEGATIVE NEGATIVE   Bilirubin Urine NEGATIVE NEGATIVE   Ketones, ur NEGATIVE NEGATIVE mg/dL   Protein, ur NEGATIVE NEGATIVE mg/dL   Nitrite NEGATIVE NEGATIVE   Leukocytes,Ua MODERATE (A) NEGATIVE   RBC / HPF 0-5 0 - 5 RBC/hpf   WBC, UA 0-5 0 - 5 WBC/hpf   Bacteria, UA NONE SEEN NONE SEEN   Mucus PRESENT   Troponin I (High Sensitivity)     Status: Abnormal   Collection Time: 01/04/19  5:07 AM  Result Value Ref Range   Troponin I (High Sensitivity) 78 (H) <18 ng/L   EKG Interpretation  Date/Time:  Thursday January 04 2019 03:11:06 EDT Ventricular Rate:  148 PR Interval:    QRS Duration: 142 QT Interval:  338 QTC Calculation: 531 R Axis:   -78 Text Interpretation:  Atrial fibrillation Left bundle branch block Confirmed by Cardama, Pedro ([id number]) on 01/04/2019 3:25:27 AM  PORTABLE CHEST 1 VIEW    COMPARISON: 03/16/2018    FINDINGS:  Cardiomegaly with vascular congestion and mild interstitial edema.  No pleural effusion or pneumothorax.    IMPRESSION:  Cardiomegaly with vascular congestion and mild interstitial edema      Electronically Signed  By: Donavan Foil M.D.  On: 01/04/2019 03:40    MDM  the patient received in sign out pending second troponin. Please see previous provider note for further workup and initial physical exam. Cardiology was consulted by previous provider who plan to follow up on clinic is delta troponin is without significant change.  Pt has history of elevated troponin, but no record of high sensitivity troponins. First troponin is 41, second troponin is 78. Discussed  result with ED attending Dr. Wilson Singer who agrees patient is stable to be discharged home as increase is not significant. On reassessment pt denies any pain or shortness of breath, cardiac monitor shows she is in sinus rhythm. She was wear nasal cannula, does not wear O2 at home. Pt without O2 does not show signs of respiratory distress or hypoxia. Pt requesting to be discharged home.  The patient appears reasonably screened and/or stabilized  for discharge and I doubt any other medical condition or other [facility] requiring further screening, evaluation, or treatment in the ED at this time prior to discharge. The patient is safe for discharge with strict return precautions discussed. Pt is reliable for cardiology follow up.      Cherre Robins, PA-C 01/04/19 [id number]    Virgel Manifold, MD 01/04/19 [id number]

## 2019-01-04 NOTE — ED Notes (Signed)
Total of 15 mg Lopressor IV given by EMS pta with no change on the HR.
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
## 2019-01-04 NOTE — Discharge Instructions (Addendum)
Continue taking home medications as prescribed. Follow-up with your cardiologist.  They are aware that you need follow-up, and should reach out to you.  If you do not hear from them, call the office number listed below. Return to emergency room if you develop chest pain, shortness of breath, recurrence of symptoms.  Return with any new, worsening, concerning symptoms.

## 2019-01-04 NOTE — ED Triage Notes (Signed)
Pt brought to ED by [facility] from home for c/o Afib with RVR on the 130's, per pt she woke up with a hard sore throat, upper back pain and left arm pain, with some SOB and nausea. BP 183/50, HR 130, R-20, SPO2 99% on 2L .

## 2019-01-22 DIAGNOSIS — E1169 Type 2 diabetes mellitus with other specified complication: Secondary | ICD-10-CM | POA: Diagnosis not present

## 2019-01-22 DIAGNOSIS — D509 Iron deficiency anemia, unspecified: Secondary | ICD-10-CM | POA: Diagnosis not present

## 2019-01-22 DIAGNOSIS — E785 Hyperlipidemia, unspecified: Secondary | ICD-10-CM | POA: Diagnosis not present

## 2019-01-22 DIAGNOSIS — I10 Essential (primary) hypertension: Secondary | ICD-10-CM | POA: Diagnosis not present

## 2019-01-22 DIAGNOSIS — I480 Paroxysmal atrial fibrillation: Secondary | ICD-10-CM | POA: Diagnosis not present

## 2019-01-22 DIAGNOSIS — F3341 Major depressive disorder, recurrent, in partial remission: Secondary | ICD-10-CM | POA: Diagnosis not present

## 2019-02-16 DIAGNOSIS — F3341 Major depressive disorder, recurrent, in partial remission: Secondary | ICD-10-CM | POA: Diagnosis not present

## 2019-02-16 DIAGNOSIS — I480 Paroxysmal atrial fibrillation: Secondary | ICD-10-CM | POA: Diagnosis not present

## 2019-02-16 DIAGNOSIS — D509 Iron deficiency anemia, unspecified: Secondary | ICD-10-CM | POA: Diagnosis not present

## 2019-02-16 DIAGNOSIS — I10 Essential (primary) hypertension: Secondary | ICD-10-CM | POA: Diagnosis not present

## 2019-02-16 DIAGNOSIS — E1169 Type 2 diabetes mellitus with other specified complication: Secondary | ICD-10-CM | POA: Diagnosis not present

## 2019-02-16 DIAGNOSIS — E785 Hyperlipidemia, unspecified: Secondary | ICD-10-CM | POA: Diagnosis not present

## 2019-03-12 ENCOUNTER — Telehealth: Payer: Self-pay | Admitting: Cardiology

## 2019-03-12 NOTE — Telephone Encounter (Signed)
Patient c/o Palpitations:  High priority if patient c/o lightheadedness, shortness of breath, or chest pain  1) How long have you had palpitations/irregular HR/ Afib? Are you having the symptoms now? no  2) Are you currently experiencing lightheadedness, SOB or CP? no  3) Do you have a history of afib (atrial fibrillation) or irregular heart rhythm? yes  4) Have you checked your BP or HR? (document readings if available): no  5) Are you experiencing any other symptoms? Not now. Pt has experienced SOB, Confusion, throat ache and pain moves into her jaw during other Afib spells.    Patient has a hx of afib. She states that when she has afib if the symptoms do not calm down she takes an extra 1/2 to a full dose of metoprolol. The patient notices the symptoms most  when the patient is in bed or sleeping.  She also mentions that it is hard to determine the difference between afib and an irregular HR. She only has a wrist cuff to measure her HR and BP, but she feels that is not accurate. Patient had a serious instance where she almost called EMS but the symptoms eased off.   The patient just wants to know what she should do if the symptoms come back. She has seen Melina Copa in the past. She wants to know because there are not any open appointments until when she is scheduled 04/20/19 and she needs to be treated sooner than that

## 2019-03-12 NOTE — Telephone Encounter (Signed)
Spoke with patient who is concerned because she is having increased episodes of At Fib.  She has been taking extra Metoprolol when needed but it is not always helping.  Scheduled pt for further evaluation and treatment in the [facility] - directions and code for gate given.  She has not had any known Covid exposures, no s/s, no recent travel etc.

## 2019-03-13 ENCOUNTER — Ambulatory Visit (HOSPITAL_COMMUNITY)
Admission: RE | Admit: 2019-03-13 | Discharge: 2019-03-13 | Disposition: A | Discharge status: Home / Self Care | Payer: Medicare Other | Source: Ambulatory Visit | Attending: Nurse Practitioner | Admitting: Nurse Practitioner

## 2019-03-13 ENCOUNTER — Other Ambulatory Visit: Payer: Self-pay

## 2019-03-13 ENCOUNTER — Encounter (HOSPITAL_COMMUNITY): Payer: Self-pay | Admitting: Nurse Practitioner

## 2019-03-13 VITALS — BP 130/88 | HR 75 | Ht 63.0 in | Wt 281.6 lb

## 2019-03-13 DIAGNOSIS — E785 Hyperlipidemia, unspecified: Secondary | ICD-10-CM | POA: Diagnosis not present

## 2019-03-13 DIAGNOSIS — G4733 Obstructive sleep apnea (adult) (pediatric): Secondary | ICD-10-CM | POA: Diagnosis not present

## 2019-03-13 DIAGNOSIS — I480 Paroxysmal atrial fibrillation: Secondary | ICD-10-CM | POA: Insufficient documentation

## 2019-03-13 DIAGNOSIS — I10 Essential (primary) hypertension: Secondary | ICD-10-CM | POA: Insufficient documentation

## 2019-03-13 DIAGNOSIS — K219 Gastro-esophageal reflux disease without esophagitis: Secondary | ICD-10-CM | POA: Diagnosis not present

## 2019-03-13 DIAGNOSIS — D509 Iron deficiency anemia, unspecified: Secondary | ICD-10-CM | POA: Insufficient documentation

## 2019-03-13 DIAGNOSIS — Z7901 Long term (current) use of anticoagulants: Secondary | ICD-10-CM | POA: Insufficient documentation

## 2019-03-13 DIAGNOSIS — K469 Unspecified abdominal hernia without obstruction or gangrene: Secondary | ICD-10-CM | POA: Insufficient documentation

## 2019-03-13 DIAGNOSIS — E118 Type 2 diabetes mellitus with unspecified complications: Secondary | ICD-10-CM | POA: Diagnosis not present

## 2019-03-13 DIAGNOSIS — F329 Major depressive disorder, single episode, unspecified: Secondary | ICD-10-CM | POA: Insufficient documentation

## 2019-03-13 DIAGNOSIS — Z79899 Other long term (current) drug therapy: Secondary | ICD-10-CM | POA: Diagnosis not present

## 2019-03-13 DIAGNOSIS — F419 Anxiety disorder, unspecified: Secondary | ICD-10-CM | POA: Diagnosis not present

## 2019-03-13 DIAGNOSIS — D6869 Other thrombophilia: Secondary | ICD-10-CM | POA: Diagnosis not present

## 2019-03-13 DIAGNOSIS — I447 Left bundle-branch block, unspecified: Secondary | ICD-10-CM | POA: Insufficient documentation

## 2019-03-13 MED ORDER — DILTIAZEM HCL 30 MG PO TABS: ORAL_TABLET | ORAL | 3 refills | Status: DC

## 2019-03-13 NOTE — Patient Instructions (Signed)
Take Diltiazem 67m every 4 hours as needed for HR >100 and top number of BP >100

## 2019-03-14 ENCOUNTER — Encounter (HOSPITAL_COMMUNITY): Payer: Self-pay | Admitting: Nurse Practitioner
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
## 2019-03-16 DIAGNOSIS — E1169 Type 2 diabetes mellitus with other specified complication: Secondary | ICD-10-CM | POA: Diagnosis not present

## 2019-03-26 ENCOUNTER — Ambulatory Visit (HOSPITAL_COMMUNITY): Payer: Medicare Other | Admitting: Nurse Practitioner

## 2019-04-04 ENCOUNTER — Ambulatory Visit (HOSPITAL_COMMUNITY): Payer: Medicare Other | Admitting: Nurse Practitioner

## 2019-04-11 ENCOUNTER — Encounter (HOSPITAL_COMMUNITY): Payer: Self-pay | Admitting: Nurse Practitioner

## 2019-04-11 ENCOUNTER — Ambulatory Visit (HOSPITAL_COMMUNITY)
Admission: RE | Admit: 2019-04-11 | Discharge: 2019-04-11 | Disposition: A | Discharge status: Home / Self Care | Payer: Medicare Other | Source: Ambulatory Visit | Attending: Nurse Practitioner | Admitting: Nurse Practitioner

## 2019-04-11 ENCOUNTER — Other Ambulatory Visit: Payer: Self-pay

## 2019-04-11 VITALS — BP 160/96 | HR 62 | Ht 63.0 in | Wt 284.0 lb

## 2019-04-11 DIAGNOSIS — I447 Left bundle-branch block, unspecified: Secondary | ICD-10-CM | POA: Insufficient documentation

## 2019-04-11 DIAGNOSIS — Z79899 Other long term (current) drug therapy: Secondary | ICD-10-CM | POA: Diagnosis not present

## 2019-04-11 DIAGNOSIS — F329 Major depressive disorder, single episode, unspecified: Secondary | ICD-10-CM | POA: Insufficient documentation

## 2019-04-11 DIAGNOSIS — D509 Iron deficiency anemia, unspecified: Secondary | ICD-10-CM | POA: Insufficient documentation

## 2019-04-11 DIAGNOSIS — G4733 Obstructive sleep apnea (adult) (pediatric): Secondary | ICD-10-CM | POA: Diagnosis not present

## 2019-04-11 DIAGNOSIS — M5126 Other intervertebral disc displacement, lumbar region: Secondary | ICD-10-CM | POA: Diagnosis not present

## 2019-04-11 DIAGNOSIS — D6869 Other thrombophilia: Secondary | ICD-10-CM | POA: Diagnosis not present

## 2019-04-11 DIAGNOSIS — F419 Anxiety disorder, unspecified: Secondary | ICD-10-CM | POA: Insufficient documentation

## 2019-04-11 DIAGNOSIS — E785 Hyperlipidemia, unspecified: Secondary | ICD-10-CM | POA: Insufficient documentation

## 2019-04-11 DIAGNOSIS — I10 Essential (primary) hypertension: Secondary | ICD-10-CM | POA: Insufficient documentation

## 2019-04-11 DIAGNOSIS — I480 Paroxysmal atrial fibrillation: Secondary | ICD-10-CM | POA: Insufficient documentation

## 2019-04-11 DIAGNOSIS — Z7901 Long term (current) use of anticoagulants: Secondary | ICD-10-CM | POA: Diagnosis not present

## 2019-04-11 DIAGNOSIS — E118 Type 2 diabetes mellitus with unspecified complications: Secondary | ICD-10-CM | POA: Diagnosis not present

## 2019-04-11 DIAGNOSIS — K219 Gastro-esophageal reflux disease without esophagitis: Secondary | ICD-10-CM | POA: Diagnosis not present

## 2019-04-11 DIAGNOSIS — K469 Unspecified abdominal hernia without obstruction or gangrene: Secondary | ICD-10-CM | POA: Diagnosis not present
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
## 2019-04-17 DIAGNOSIS — E1169 Type 2 diabetes mellitus with other specified complication: Secondary | ICD-10-CM | POA: Diagnosis not present

## 2019-04-17 DIAGNOSIS — E785 Hyperlipidemia, unspecified: Secondary | ICD-10-CM | POA: Diagnosis not present

## 2019-04-17 DIAGNOSIS — F3341 Major depressive disorder, recurrent, in partial remission: Secondary | ICD-10-CM | POA: Diagnosis not present

## 2019-04-17 DIAGNOSIS — I480 Paroxysmal atrial fibrillation: Secondary | ICD-10-CM | POA: Diagnosis not present

## 2019-04-17 DIAGNOSIS — I10 Essential (primary) hypertension: Secondary | ICD-10-CM | POA: Diagnosis not present

## 2019-04-17 DIAGNOSIS — D509 Iron deficiency anemia, unspecified: Secondary | ICD-10-CM | POA: Diagnosis not present

## 2019-04-18 ENCOUNTER — Encounter: Payer: Self-pay | Admitting: Physician Assistant
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
## 2019-04-20 ENCOUNTER — Encounter: Payer: Self-pay | Admitting: *Deleted

## 2019-04-20 ENCOUNTER — Encounter: Payer: Self-pay | Admitting: Physician Assistant

## 2019-04-20 ENCOUNTER — Ambulatory Visit (INDEPENDENT_AMBULATORY_CARE_PROVIDER_SITE_OTHER): Payer: Medicare Other | Admitting: Physician Assistant

## 2019-04-20 ENCOUNTER — Other Ambulatory Visit: Payer: Self-pay

## 2019-04-20 VITALS — BP 140/78 | HR 76 | Ht 63.0 in | Wt 286.0 lb

## 2019-04-20 DIAGNOSIS — I340 Nonrheumatic mitral (valve) insufficiency: Secondary | ICD-10-CM

## 2019-04-20 DIAGNOSIS — G4733 Obstructive sleep apnea (adult) (pediatric): Secondary | ICD-10-CM

## 2019-04-20 DIAGNOSIS — R778 Other specified abnormalities of plasma proteins: Secondary | ICD-10-CM

## 2019-04-20 DIAGNOSIS — I480 Paroxysmal atrial fibrillation: Secondary | ICD-10-CM

## 2019-04-20 DIAGNOSIS — I10 Essential (primary) hypertension: Secondary | ICD-10-CM

## 2019-04-20 MED ORDER — CARVEDILOL 25 MG PO TABS: 25.0000 mg | ORAL_TABLET | Freq: Two times a day (BID) | ORAL | 3 refills | Status: DC

## 2019-04-20 NOTE — Patient Instructions (Addendum)
Medication Instructions:  Your physician has recommended you make the following change in your medication:  1.  STOP the Metoprolol 2.  START Carvedilol 25 mg taking 1 tablet twice a day  *If you need a refill on your cardiac medications before your next appointment, please call your pharmacy*  Lab Work: None ordered  If you have labs (blood work) drawn today and your tests are completely normal, you will receive your results only by: [name] Kitchen MyChart Message (if you have MyChart) OR . A paper copy in the mail If you have any lab test that is abnormal or we need to change your treatment, we will call you to review the results.  Testing/Procedures: Your physician has requested that you have a lexiscan myoview. For further information please visit [email or web address]. Please follow instruction sheet, as given.    Follow-Up: At [facility], you and your health needs are our priority.  As part of our continuing mission to provide you with exceptional heart care, we have created designated Provider Care Teams.  These Care Teams include your primary Cardiologist (physician) and Advanced Practice Providers (APPs -  Physician Assistants and Nurse Practitioners) who all work together to provide you with the care you need, when you need it.  Your next appointment:   05/07/2019   The format for your next appointment:   Virtual Visit   Provider:   Melina Copa, PA-C  Other Instructions Please find out from your primary care provider if she is going to be managing your sleep apnea. If you decide you want to follow through our office, please let us know and we will get you set up with Dr. Radford Pax who does sleep medicine in our office.  Please get a blood pressure cuff that goes on your arm. The wrist ones can be inaccurate. If possible, try to select one that also reports your heart rate. To check your blood pressure, choose a time about 3 hours after taking your blood pressure medicines. Remain seated  in a chair for 5 minutes quietly beforehand, then check it. When you get a cuff, please get those readings and call us/send in MyChart message with them for our review. Please call if any concerns about blood pressure before next visit.   YOUR CARDIOLOGY TEAM HAS ARRANGED FOR AN E-VISIT FOR YOUR APPOINTMENT - PLEASE REVIEW IMPORTANT INFORMATION BELOW SEVERAL DAYS PRIOR TO YOUR APPOINTMENT  Due to the recent COVID-19 pandemic, we are transitioning in-person office visits to tele-medicine visits in an effort to decrease unnecessary exposure to our patients, their families, and staff. These visits are billed to your insurance just like a normal visit is. We also encourage you to sign up for MyChart if you have not already done so. You will need a smartphone if possible. For patients that do not have this, we can still complete the visit using a regular telephone but do prefer a smartphone to enable video when possible. You may have a family member that lives with you that can help. If possible, we also ask that you have a blood pressure cuff and scale at home to measure your blood pressure, heart rate and weight prior to your scheduled appointment. Patients with clinical needs that need an in-person evaluation and testing will still be able to come to the office if absolutely necessary. If you have any questions, feel free to call our office.     YOUR PROVIDER WILL BE USING THE FOLLOWING PLATFORM TO COMPLETE YOUR VISIT: [email or web address]   .  IF USING [email or web address] - The staff will give you instructions on receiving your link to join the meeting the day of your visit.    THE DAY OF YOUR APPOINTMENT  Approximately 15 minutes prior to your scheduled appointment, you will receive a telephone call from one of [facility] team - your caller ID may say "Unknown caller."  Our staff will confirm medications, vital signs for the day and any symptoms you may be experiencing. Please have this information available prior to the  time of visit start. It may also be helpful for you to have a pad of paper and pen handy for any instructions given during your visit. They will also walk you through joining the smartphone meeting if this is a video visit.    CONSENT FOR TELE-HEALTH VISIT - PLEASE REVIEW  I hereby voluntarily request, consent and authorize [facility] and its employed or contracted physicians, physician assistants, nurse practitioners or other licensed health care professionals (the Practitioner), to provide me with telemedicine health care services (the "Services") as deemed necessary by the treating Practitioner. I acknowledge and consent to receive the Services by the Practitioner via telemedicine. I understand that the telemedicine visit will involve communicating with the Practitioner through live audiovisual communication technology and the disclosure of certain medical information by electronic transmission. I acknowledge that I have been given the opportunity to request an in-person assessment or other available alternative prior to the telemedicine visit and am voluntarily participating in the telemedicine visit.  I understand that I have the right to withhold or withdraw my consent to the use of telemedicine in the course of my care at any time, without affecting my right to future care or treatment, and that the Practitioner or I may terminate the telemedicine visit at any time. I understand that I have the right to inspect all information obtained and/or recorded in the course of the telemedicine visit and may receive copies of available information for a reasonable fee.  I understand that some of the potential risks of receiving the Services via telemedicine include:  [name] Kitchen Delay or interruption in medical evaluation due to technological equipment failure or disruption; . Information transmitted may not be sufficient (e.g. poor resolution of images) to allow for appropriate medical decision making by the  Practitioner; and/or  . In rare instances, security protocols could fail, causing a breach of personal health information.  Furthermore, I acknowledge that it is my responsibility to provide information about my medical history, conditions and care that is complete and accurate to the best of my ability. I acknowledge that Practitioner's advice, recommendations, and/or decision may be based on factors not within their control, such as incomplete or inaccurate data provided by me or distortions of diagnostic images or specimens that may result from electronic transmissions. I understand that the practice of medicine is not an exact science and that Practitioner makes no warranties or guarantees regarding treatment outcomes. I acknowledge that I will receive a copy of this consent concurrently upon execution via email to the email address I last provided but may also request a printed copy by calling the office of [facility].    I understand that my insurance will be billed for this visit.   I have read or had this consent read to me. . I understand the contents of this consent, which adequately explains the benefits and risks of the Services being provided via telemedicine.  . I have been provided ample opportunity to ask questions regarding this consent  and the Services and have had my questions answered to my satisfaction. . I give my informed consent for the services to be provided through the use of telemedicine in my medical care  By participating in this telemedicine visit I agree to the above.

## 2019-05-03 ENCOUNTER — Encounter (HOSPITAL_COMMUNITY): Payer: Self-pay | Admitting: *Deleted

## 2019-05-03 ENCOUNTER — Telehealth (HOSPITAL_COMMUNITY): Payer: Self-pay | Admitting: *Deleted

## 2019-05-03 NOTE — Telephone Encounter (Signed)
Left message on voicemail per DPR in reference to upcoming appointment scheduled on 05/07/19 with detailed instructions given per Myocardial Perfusion Study Information Sheet for the test. LM to arrive 15 minutes early, and that it is imperative to arrive on time for appointment to keep from having the test rescheduled. If you need to cancel or reschedule your appointment, please call the office within 24 hours of your appointment. Failure to do so may result in a cancellation of your appointment, and a $50 no show fee. Phone number given for call back for any questions. Kirstie Peri

## 2019-05-07 ENCOUNTER — Ambulatory Visit (HOSPITAL_COMMUNITY): Payer: Medicare Other

## 2019-05-07 ENCOUNTER — Telehealth: Payer: Medicare Other | Admitting: Physician Assistant

## 2019-05-09 ENCOUNTER — Ambulatory Visit (HOSPITAL_COMMUNITY): Payer: Medicare Other
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
## 2019-05-14 ENCOUNTER — Telehealth: Payer: Medicare Other | Admitting: Physician Assistant

## 2019-05-16 ENCOUNTER — Telehealth (HOSPITAL_COMMUNITY): Payer: Self-pay | Admitting: *Deleted

## 2019-05-16 NOTE — Telephone Encounter (Signed)
Left message on voicemail per DPR in reference to upcoming appointment scheduled on 05/21/19 at 1:00 with detailed instructions given per Myocardial Perfusion Study Information Sheet for the test. LM to arrive 15 minutes early, and that it is imperative to arrive on time for appointment to keep from having the test rescheduled. If you need to cancel or reschedule your appointment, please call the office within 24 hours of your appointment. Failure to do so may result in a cancellation of your appointment, and a $50 no show fee. Phone number given for call back for any questions.

## 2019-05-21 ENCOUNTER — Other Ambulatory Visit: Payer: Self-pay

## 2019-05-21 ENCOUNTER — Telehealth: Payer: Self-pay | Admitting: *Deleted

## 2019-05-21 ENCOUNTER — Ambulatory Visit (HOSPITAL_COMMUNITY): Payer: Medicare Other | Attending: Cardiovascular Disease

## 2019-05-21 DIAGNOSIS — I480 Paroxysmal atrial fibrillation: Secondary | ICD-10-CM | POA: Insufficient documentation

## 2019-05-21 DIAGNOSIS — I10 Essential (primary) hypertension: Secondary | ICD-10-CM | POA: Insufficient documentation

## 2019-05-21 DIAGNOSIS — R778 Other specified abnormalities of plasma proteins: Secondary | ICD-10-CM | POA: Diagnosis not present

## 2019-05-21 DIAGNOSIS — G4733 Obstructive sleep apnea (adult) (pediatric): Secondary | ICD-10-CM | POA: Insufficient documentation

## 2019-05-21 DIAGNOSIS — I340 Nonrheumatic mitral (valve) insufficiency: Secondary | ICD-10-CM | POA: Diagnosis not present

## 2019-05-21 MED ORDER — TECHNETIUM TC 99M TETROFOSMIN IV KIT
32.5000 | PACK | Freq: Once | INTRAVENOUS | Status: AC | PRN
  Administered 2019-05-21: 32.5 via INTRAVENOUS
  Filled 2019-05-21: qty 33

## 2019-05-21 MED ORDER — REGADENOSON 0.4 MG/5ML IV SOLN
0.4000 mg | Freq: Once | INTRAVENOUS | Status: AC
  Administered 2019-05-21: 0.4 mg via INTRAVENOUS

## 2019-05-21 NOTE — Telephone Encounter (Signed)

## 2019-05-22 ENCOUNTER — Ambulatory Visit (HOSPITAL_COMMUNITY): Payer: Medicare Other | Attending: Cardiovascular Disease

## 2019-05-22 LAB — MYOCARDIAL PERFUSION IMAGING
LV dias vol: 88 mL (ref 46–106)
LV sys vol: 42 mL
Peak HR: 88 {beats}/min
Rest HR: 76 {beats}/min
SDS: 1
SRS: 1
SSS: 2
TID: 0.96

## 2019-05-22 MED ORDER — TECHNETIUM TC 99M TETROFOSMIN IV KIT
30.7000 | PACK | Freq: Once | INTRAVENOUS | Status: AC | PRN
  Administered 2019-05-22: 30.7 via INTRAVENOUS
  Filled 2019-05-22: qty 31
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
## 2019-05-24 ENCOUNTER — Telehealth: Payer: Self-pay | Admitting: Cardiology

## 2019-05-24 NOTE — Telephone Encounter (Signed)
Called to speak with patient.  After verifying her DOB - determined which test she is calling for the results of.  She states her stress test:  Addendum by Skeet Latch, MD on Tue May 22, 2019 3:56 PM   Nuclear stress EF: 52%.  The left ventricular ejection fraction is mildly decreased (45-54%).  There was no ST segment deviation noted during stress.  This is a low risk study.  Septal dyskinesis consistent with LBBB.  No ischemia.     Reviewed the above results with pt who states understanding.  She reports she has not been checking her HR/BP at home.  The monitor she wants to check it costs $100 and will be have to save up for it.  Encouraged her to keep a check on it when possible.

## 2019-05-24 NOTE — Telephone Encounter (Signed)
Jerline Pain, MD  04/27/2018 7:08 PM EST    Normal pump function, normal EF. Normal left atrial size. Reassuring.  Candee Furbish, MD

## 2019-05-24 NOTE — Telephone Encounter (Signed)
New message   Patient is returning call to get echo results. Please call.

## 2019-05-25 DIAGNOSIS — I10 Essential (primary) hypertension: Secondary | ICD-10-CM | POA: Diagnosis not present

## 2019-05-25 DIAGNOSIS — E785 Hyperlipidemia, unspecified: Secondary | ICD-10-CM | POA: Diagnosis not present

## 2019-05-25 DIAGNOSIS — E1169 Type 2 diabetes mellitus with other specified complication: Secondary | ICD-10-CM | POA: Diagnosis not present

## 2019-05-25 DIAGNOSIS — I480 Paroxysmal atrial fibrillation: Secondary | ICD-10-CM | POA: Diagnosis not present

## 2019-05-25 DIAGNOSIS — F3341 Major depressive disorder, recurrent, in partial remission: Secondary | ICD-10-CM | POA: Diagnosis not present

## 2019-05-25 DIAGNOSIS — D509 Iron deficiency anemia, unspecified: Secondary | ICD-10-CM | POA: Diagnosis not present

## 2019-05-28 ENCOUNTER — Telehealth: Payer: Self-pay | Admitting: Physician Assistant

## 2019-05-28 NOTE — Telephone Encounter (Signed)
error 

## 2019-05-28 NOTE — Telephone Encounter (Signed)
Returned call to pt and she has been made aware of her stress test results. Pt also explained how the virtual appt will work for tomorrow with Thalia Bloodgood, NP.  See verbal consent below:  Your [facility] team (Cardiologist [Heart Doctor] and Advanced Practice Provider [phone] Assistant; Nurse Practitioner]) has arranged for your next office appointment to be a virtual visit (also known as "Telehealth", "Telemedicine", "E-Visit").   We now offer virtual visits for all our patients.  This helps [place] to expand our ability to see patients in a timely and safe manner.  These visits are billed to your insurance just like traditional, in person, appointments.  Please review this IMPORTANT information about your upcoming appointment.   **PLEASE READ THE SECTION BELOW LABELED "CONSENT".**   **CALL OUR OFFICE WITH QUESTIONS.**   WHAT YOU NEED FOR YOUR VIRTUAL VISIT:  You will need a SmartPhone with microphone and video capability.  [If you are using MyChart to connect to your visit, it is also possible to use a desktop/laptop computer (with an Internet connection), as long as you have microphone and video capability.]  You will need to use Chrome, Edge or [employer] as your Wellsite geologist.  We highly recommend that you have a MyChart account as this will make connecting to your visit seamless.  A MyChart account not only allows you to connect to your provider for a virtual visit, but also allows you to see the results of all your tests, provider notes, medications and upcoming appointments.    A MyChart account is not absolutely necessary.  We can still complete your visit if you do not have one.    If you do not have a computer or SmartPhone with video/microphone capability or your Internet/cell service is weak on the day of your visit, we will do your visit by telephone.    A blood pressure cuff and scale are essential to collect your vital signs at home.  If you do not have these and you are unable  to obtain them, please contact our office so that we can make arrangements for you.  If you have a pulse oximeter, Apple watch, Kardia mobile device, etc, you can collect data from these devices as well to share with the provider for your visit.  These devices are not required for a virtual visit.    WHAT TO DO ON THE DAY OF YOUR APPOINTMENT: 30 minutes before your appointment:  Take your blood pressure, pulse or heart rate (if your blood pressure machine is able to collect it) and weight.  Write all these numbers down so you can give it to the nurse or medical assistant that calls.  Get all of the medications you currently take and put them where you will be sitting for the appointment.  The nurse/medical assistant will go over these with you when he/she calls.  15 minutes before your appointment:  You will receive a phone call from a nurse or medical assistant from our office.  The caller ID on your phone may indicate that the caller is either "[facility]" or "[place]".  However, the number may come across as spam.  Please turn off any spam blocker so you do not miss the call.  The nurse will:  Ask for your blood pressure, pulse, weight, height.  Go over all of your medications to make sure your chart is correct.  Review your allergies, smoking history, reason for appointment, etc.   Give you instructions on how to  connect with the video platform or telephone.  IF YOUR VISIT IS BY TELEPHONE ONLY: After the nurse finishes getting you ready for the visit, your provider will call you on the phone number you provide to [place].   TO CONNECT WITH YOUR PROVIDER FOR YOUR APPOINTMENT (BY VIDEO): You will either connect with the provider with your MyChart account or (if you are not using MyChart) with a link sent to your SmartPhone by text message.  If you are using MyChart, see below.  If you are not using MyChart, the nurse will send you the text message during the phone call.     If  you are connecting with your MyChart account:   (The nurse that calls you will tell you when to do this):  You will log into your account. At the top of your home screen, you should see the following prompt that tells you to "Begin your video visit with . . . ".  Click the green button (BEGIN VISIT).    The next screen will say "It's time to start your video visit!" Click the green button (BEGIN VIDEO VISIT)    There may be a screen that appears that asks for permission to use your camera and/or microphone.  Click ALLOW.  This will open the browser where your appointment will take place.   You may see the message: "Welcome.  Waiting for the call to begin."  Or, you will see that the nurse or your provider are already "in" the room waiting on you.   If you are connecting with a link sent to your SmartPhone via text: The nurse that calls you will send the link by text message. Click on the link (it should look something like this):     If asked to give permission to use the camera and or microphone, click ALLOW This will open the browser where your appointment will take place.      You may see the message: "Welcome.  Waiting for the call to begin."  Or, you will see that the nurse or your provider are already "in" the room waiting on you.    The controls for your visit look like the picture below.  Please note that this is what the microphone and camera look like when they are ON.  If muted, they will have a line through them.    After the appointment: Once your provider leaves the appointment, he/she will go over instructions with the nurse/medical assistant.  If needed, this person will call you with any instructions, appointments, etc.   A copy of your After Visit Summary (AVS) will be available later that day in your MyChart account.  This document will have all of your instructions, medications, appointments, etc.  If you are not using MyChart, we will mail it to your home.       *CONSENT FOR TELE-HEALTH VISIT - PLEASE REVIEW* By participating in the scheduled virtual visit (and any virtual visit scheduled within 365 days of the printing of this document), I agree to the following:   I hereby voluntarily request, consent and authorize [facility] and its employed or contracted physicians, physician assistants, nurse practitioners or other licensed health care professionals (the Practitioner), to provide me with telemedicine health care services (the "Services") as deemed necessary by the treating Practitioner. I acknowledge and consent to receive the Services by the Practitioner via telemedicine. I understand that the telemedicine visit will involve communicating with the Practitioner through live audiovisual communication  technology and the disclosure of certain medical information by electronic transmission. I acknowledge that I have been given the opportunity to request an in-person assessment or other available alternative prior to the telemedicine visit and am voluntarily participating in the telemedicine visit.  I understand that I have the right to withhold or withdraw my consent to the use of telemedicine in the course of my care at any time, without affecting my right to future care or treatment, and that the Practitioner or I may terminate the telemedicine visit at any time. I understand that I have the right to inspect all information obtained and/or recorded in the course of the telemedicine visit and may receive copies of available information for a reasonable fee.  I understand that some of the potential risks of receiving the Services via telemedicine include:  [name] Kitchen Delay or interruption in medical evaluation due to technological equipment failure or disruption; . Information transmitted may not be sufficient (e.g. poor resolution of images) to allow for appropriate medical decision making by the Practitioner; and/or  . In rare instances, security protocols could  fail, causing a breach of personal health information.  Furthermore, I acknowledge that it is my responsibility to provide information about my medical history, conditions and care that is complete and accurate to the best of my ability. I acknowledge that Practitioner's advice, recommendations, and/or decision may be based on factors not within their control, such as incomplete or inaccurate data provided by me or distortions of diagnostic images or specimens that may result from electronic transmissions. I understand that the practice of medicine is not an exact science and that Practitioner makes no warranties or guarantees regarding treatment outcomes. I acknowledge that a copy of this consent can be made available to me via my patient portal ([facility]), or I can request a printed copy by calling the office of [place].    I understand that my insurance will be billed for this visit.   I have read or had this consent read to me. . I understand the contents of this consent, which adequately explains the benefits and risks of the Services being provided via telemedicine.  . I have been provided ample opportunity to ask questions regarding this consent and the Services and have had my questions answered to my satisfaction. . I give my informed consent for the services to be provided through the use of telemedicine in my medical care

## 2019-05-28 NOTE — Telephone Encounter (Signed)
Follow Up:      Returning jennifer's call from this morning, concerning her Stress Test results.

## 2019-05-29 ENCOUNTER — Other Ambulatory Visit: Payer: Self-pay

## 2019-05-29 ENCOUNTER — Encounter: Payer: Self-pay | Admitting: Nurse Practitioner

## 2019-05-29 ENCOUNTER — Telehealth (INDEPENDENT_AMBULATORY_CARE_PROVIDER_SITE_OTHER): Payer: Medicare Other | Admitting: Nurse Practitioner

## 2019-05-29 VITALS — BP 130/78 | HR 78 | Ht 63.0 in | Wt 290.0 lb

## 2019-05-29 DIAGNOSIS — I480 Paroxysmal atrial fibrillation: Secondary | ICD-10-CM

## 2019-05-29 DIAGNOSIS — Z7901 Long term (current) use of anticoagulants: Secondary | ICD-10-CM

## 2019-05-29 DIAGNOSIS — Z6841 Body Mass Index (BMI) 40.0 and over, adult: Secondary | ICD-10-CM

## 2019-05-29 DIAGNOSIS — G4733 Obstructive sleep apnea (adult) (pediatric): Secondary | ICD-10-CM

## 2019-05-29 DIAGNOSIS — Z7189 Other specified counseling: Secondary | ICD-10-CM

## 2019-05-29 DIAGNOSIS — I10 Essential (primary) hypertension: Secondary | ICD-10-CM | POA: Diagnosis not present

## 2019-05-29 DIAGNOSIS — I340 Nonrheumatic mitral (valve) insufficiency: Secondary | ICD-10-CM

## 2019-05-29 DIAGNOSIS — Z9989 Dependence on other enabling machines and devices: Secondary | ICD-10-CM

## 2019-05-29 NOTE — Patient Instructions (Addendum)
After Visit Summary:  We will be checking the following labs today - NONE   Medication Instructions:    Continue with your current medicines. BUT  Ok to hold your Irbesartan for 2 or 3 days - see if your swelling in your lip goes away - if so - let us know later this week - if it does not - ok to resume.    If you need a refill on your cardiac medications before your next appointment, please call your pharmacy.     Testing/Procedures To Be Arranged:  N/A  Follow-Up:   See Dr. Marlou Porch in 3 to 4 months.   We will see about getting Dr. Radford Pax to follow your CPAP machine.     At [facility], you and your health needs are our priority.  As part of our continuing mission to provide you with exceptional heart care, we have created designated Provider Care Teams.  These Care Teams include your primary Cardiologist (physician) and Advanced Practice Providers (APPs -  Physician Assistants and Nurse Practitioners) who all work together to provide you with the care you need, when you need it.  Special Instructions:  . Stay safe, stay home, wash your hands for at least 20 seconds and wear a mask when out in public.  . It was good to talk with you today.    Call the [place] office at [phone] if you have any questions, problems or concerns.

## 2019-05-30 ENCOUNTER — Telehealth: Payer: Self-pay | Admitting: *Deleted

## 2019-05-30 NOTE — Telephone Encounter (Signed)
Reached out to patient to and left a message that she has a virtual appointment on 06/21/19 to establish sleep therapy with Dr Radford Pax. Left detailed message on voicemail with date and time of appointment and informed patient to call back to confirm or reschedule.

## 2019-05-30 NOTE — Telephone Encounter (Signed)
-----   Message from Jeanann Lewandowsky, [place] sent at 05/29/2019  3:20 PM EST ----- Regarding: CPAP [name], this is from [employer].  She wants [facility] to follow pt's CPAP.  See her message below.   needs note sent to nina for turner to follow her cpap  Let me know if we need to do anything else.

## 2019-06-20 ENCOUNTER — Telehealth: Payer: Self-pay | Admitting: *Deleted

## 2019-06-20 NOTE — Telephone Encounter (Signed)

## 2019-06-21 ENCOUNTER — Telehealth: Payer: Medicare Other | Admitting: Cardiology

## 2019-06-27 DIAGNOSIS — I129 Hypertensive chronic kidney disease with stage 1 through stage 4 chronic kidney disease, or unspecified chronic kidney disease: Secondary | ICD-10-CM | POA: Diagnosis not present

## 2019-07-10 DIAGNOSIS — F3341 Major depressive disorder, recurrent, in partial remission: Secondary | ICD-10-CM | POA: Diagnosis not present

## 2019-07-10 DIAGNOSIS — D509 Iron deficiency anemia, unspecified: Secondary | ICD-10-CM | POA: Diagnosis not present

## 2019-07-10 DIAGNOSIS — I480 Paroxysmal atrial fibrillation: Secondary | ICD-10-CM | POA: Diagnosis not present

## 2019-07-10 DIAGNOSIS — I10 Essential (primary) hypertension: Secondary | ICD-10-CM | POA: Diagnosis not present

## 2019-07-10 DIAGNOSIS — E785 Hyperlipidemia, unspecified: Secondary | ICD-10-CM | POA: Diagnosis not present

## 2019-07-10 DIAGNOSIS — E1169 Type 2 diabetes mellitus with other specified complication: Secondary | ICD-10-CM | POA: Diagnosis not present

## 2019-07-23 ENCOUNTER — Other Ambulatory Visit: Payer: Self-pay

## 2019-07-23 ENCOUNTER — Encounter: Payer: Medicare Other | Admitting: Cardiology

## 2019-07-23 NOTE — Progress Notes (Signed)
This encounter was created in error - please disregard.

## 2019-08-08 ENCOUNTER — Telehealth: Payer: Medicare Other | Admitting: Cardiology

## 2019-08-09 ENCOUNTER — Encounter: Payer: Self-pay | Admitting: *Deleted

## 2019-08-09 NOTE — Telephone Encounter (Signed)
This encounter was created in error - please disregard.

## 2019-08-13 ENCOUNTER — Other Ambulatory Visit: Payer: Self-pay

## 2019-08-13 ENCOUNTER — Telehealth: Payer: Medicare Other | Admitting: Cardiology

## 2019-08-14 DIAGNOSIS — F3341 Major depressive disorder, recurrent, in partial remission: Secondary | ICD-10-CM | POA: Diagnosis not present

## 2019-08-14 DIAGNOSIS — F419 Anxiety disorder, unspecified: Secondary | ICD-10-CM | POA: Diagnosis not present

## 2019-08-22 ENCOUNTER — Encounter: Payer: Self-pay | Admitting: Physician Assistant

## 2019-08-22 ENCOUNTER — Other Ambulatory Visit: Payer: Self-pay

## 2019-08-22 ENCOUNTER — Ambulatory Visit (INDEPENDENT_AMBULATORY_CARE_PROVIDER_SITE_OTHER): Payer: Medicare Other

## 2019-08-22 ENCOUNTER — Ambulatory Visit (INDEPENDENT_AMBULATORY_CARE_PROVIDER_SITE_OTHER): Payer: Medicare Other | Admitting: Physician Assistant

## 2019-08-22 ENCOUNTER — Ambulatory Visit: Payer: Self-pay

## 2019-08-22 DIAGNOSIS — M79641 Pain in right hand: Secondary | ICD-10-CM | POA: Diagnosis not present

## 2019-08-22 DIAGNOSIS — M542 Cervicalgia: Secondary | ICD-10-CM

## 2019-08-22 DIAGNOSIS — M79642 Pain in left hand: Secondary | ICD-10-CM

## 2019-08-22 MED ORDER — PREDNISONE 10 MG PO TABS: 10.0000 mg | ORAL_TABLET | Freq: Every day | ORAL | 0 refills | Status: DC
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
## 2019-09-06 ENCOUNTER — Ambulatory Visit (INDEPENDENT_AMBULATORY_CARE_PROVIDER_SITE_OTHER): Payer: Medicare Other | Admitting: Cardiology

## 2019-09-06 ENCOUNTER — Encounter: Payer: Self-pay | Admitting: Cardiology

## 2019-09-06 ENCOUNTER — Other Ambulatory Visit: Payer: Self-pay

## 2019-09-06 VITALS — BP 140/80 | HR 69 | Ht 63.0 in | Wt 276.0 lb

## 2019-09-06 DIAGNOSIS — Z9989 Dependence on other enabling machines and devices: Secondary | ICD-10-CM | POA: Diagnosis not present

## 2019-09-06 DIAGNOSIS — G4733 Obstructive sleep apnea (adult) (pediatric): Secondary | ICD-10-CM

## 2019-09-06 DIAGNOSIS — I480 Paroxysmal atrial fibrillation: Secondary | ICD-10-CM | POA: Diagnosis not present

## 2019-09-06 NOTE — Patient Instructions (Signed)
Medication Instructions:  The current medical regimen is effective;  continue present plan and medications.  *If you need a refill on your cardiac medications before your next appointment, please call your pharmacy*  Follow-Up: At [facility], you and your health needs are our priority.  As part of our continuing mission to provide you with exceptional heart care, we have created designated Provider Care Teams.  These Care Teams include your primary Cardiologist (physician) and Advanced Practice Providers (APPs -  Physician Assistants and Nurse Practitioners) who all work together to provide you with the care you need, when you need it.  We recommend signing up for the patient portal called "MyChart".  Sign up information is provided on this After Visit Summary.  MyChart is used to connect with patients for Virtual Visits (Telemedicine).  Patients are able to view lab/test results, encounter notes, upcoming appointments, etc.  Non-urgent messages can be sent to your provider as well.   To learn more about what you can do with MyChart, go to [email or web address].    Your next appointment:   6 month(s)  The format for your next appointment:   In Person  Provider:   Truitt Merle, NP and Dr Marlou Porch in 1 year.  Thank you for choosing [facility]!!
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
## 2019-09-12 ENCOUNTER — Ambulatory Visit: Payer: Medicare Other | Admitting: Physician Assistant

## 2019-09-18 ENCOUNTER — Other Ambulatory Visit: Payer: Self-pay

## 2019-09-18 ENCOUNTER — Encounter: Payer: Self-pay | Admitting: Physician Assistant

## 2019-09-18 ENCOUNTER — Ambulatory Visit (INDEPENDENT_AMBULATORY_CARE_PROVIDER_SITE_OTHER): Payer: Medicare Other | Admitting: Orthopedic Surgery

## 2019-09-18 VITALS — Ht 63.0 in | Wt 276.0 lb

## 2019-09-18 DIAGNOSIS — M65341 Trigger finger, right ring finger: Secondary | ICD-10-CM

## 2019-09-18 DIAGNOSIS — M79641 Pain in right hand: Secondary | ICD-10-CM | POA: Diagnosis not present

## 2019-09-18 DIAGNOSIS — M65342 Trigger finger, left ring finger: Secondary | ICD-10-CM | POA: Diagnosis not present

## 2019-09-18 DIAGNOSIS — M79642 Pain in left hand: Secondary | ICD-10-CM

## 2019-09-18 MED ORDER — PREDNISONE 10 MG PO TABS: 10.0000 mg | ORAL_TABLET | Freq: Every day | ORAL | 1 refills | Status: DC

## 2019-09-19 ENCOUNTER — Encounter: Payer: Self-pay | Admitting: Orthopedic Surgery
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
## 2019-10-16 ENCOUNTER — Ambulatory Visit: Payer: Medicare Other | Admitting: Physician Assistant

## 2020-01-01 DIAGNOSIS — S336XXA Sprain of sacroiliac joint, initial encounter: Secondary | ICD-10-CM | POA: Diagnosis not present

## 2020-01-01 DIAGNOSIS — M9905 Segmental and somatic dysfunction of pelvic region: Secondary | ICD-10-CM | POA: Diagnosis not present

## 2020-01-04 DIAGNOSIS — M9905 Segmental and somatic dysfunction of pelvic region: Secondary | ICD-10-CM | POA: Diagnosis not present

## 2020-01-04 DIAGNOSIS — S336XXA Sprain of sacroiliac joint, initial encounter: Secondary | ICD-10-CM | POA: Diagnosis not present

## 2020-01-05 DIAGNOSIS — S336XXA Sprain of sacroiliac joint, initial encounter: Secondary | ICD-10-CM | POA: Diagnosis not present

## 2020-01-05 DIAGNOSIS — M9905 Segmental and somatic dysfunction of pelvic region: Secondary | ICD-10-CM | POA: Diagnosis not present

## 2020-01-18 DIAGNOSIS — S336XXA Sprain of sacroiliac joint, initial encounter: Secondary | ICD-10-CM | POA: Diagnosis not present

## 2020-01-18 DIAGNOSIS — M9905 Segmental and somatic dysfunction of pelvic region: Secondary | ICD-10-CM | POA: Diagnosis not present

## 2020-01-23 DIAGNOSIS — M9905 Segmental and somatic dysfunction of pelvic region: Secondary | ICD-10-CM | POA: Diagnosis not present

## 2020-01-23 DIAGNOSIS — S336XXA Sprain of sacroiliac joint, initial encounter: Secondary | ICD-10-CM | POA: Diagnosis not present

## 2020-01-25 DIAGNOSIS — M9905 Segmental and somatic dysfunction of pelvic region: Secondary | ICD-10-CM | POA: Diagnosis not present

## 2020-01-25 DIAGNOSIS — S336XXA Sprain of sacroiliac joint, initial encounter: Secondary | ICD-10-CM | POA: Diagnosis not present

## 2020-01-31 DIAGNOSIS — E1169 Type 2 diabetes mellitus with other specified complication: Secondary | ICD-10-CM | POA: Diagnosis not present

## 2020-01-31 DIAGNOSIS — I10 Essential (primary) hypertension: Secondary | ICD-10-CM | POA: Diagnosis not present

## 2020-01-31 DIAGNOSIS — F3341 Major depressive disorder, recurrent, in partial remission: Secondary | ICD-10-CM | POA: Diagnosis not present

## 2020-01-31 DIAGNOSIS — I480 Paroxysmal atrial fibrillation: Secondary | ICD-10-CM | POA: Diagnosis not present

## 2020-01-31 DIAGNOSIS — G2581 Restless legs syndrome: Secondary | ICD-10-CM | POA: Diagnosis not present

## 2020-01-31 DIAGNOSIS — E785 Hyperlipidemia, unspecified: Secondary | ICD-10-CM | POA: Diagnosis not present
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
## 2020-02-26 DIAGNOSIS — E785 Hyperlipidemia, unspecified: Secondary | ICD-10-CM | POA: Diagnosis not present

## 2020-02-26 DIAGNOSIS — K219 Gastro-esophageal reflux disease without esophagitis: Secondary | ICD-10-CM | POA: Diagnosis not present

## 2020-02-26 DIAGNOSIS — E1169 Type 2 diabetes mellitus with other specified complication: Secondary | ICD-10-CM | POA: Diagnosis not present

## 2020-02-26 DIAGNOSIS — D509 Iron deficiency anemia, unspecified: Secondary | ICD-10-CM | POA: Diagnosis not present

## 2020-02-26 DIAGNOSIS — I10 Essential (primary) hypertension: Secondary | ICD-10-CM | POA: Diagnosis not present

## 2020-02-26 DIAGNOSIS — F3341 Major depressive disorder, recurrent, in partial remission: Secondary | ICD-10-CM | POA: Diagnosis not present

## 2020-02-26 DIAGNOSIS — I480 Paroxysmal atrial fibrillation: Secondary | ICD-10-CM | POA: Diagnosis not present

## 2020-03-10 ENCOUNTER — Ambulatory Visit: Payer: Medicare Other | Admitting: Nurse Practitioner

## 2020-03-12 DIAGNOSIS — F3341 Major depressive disorder, recurrent, in partial remission: Secondary | ICD-10-CM | POA: Diagnosis not present

## 2020-03-12 DIAGNOSIS — F900 Attention-deficit hyperactivity disorder, predominantly inattentive type: Secondary | ICD-10-CM | POA: Diagnosis not present

## 2020-03-12 DIAGNOSIS — L03012 Cellulitis of left finger: Secondary | ICD-10-CM | POA: Diagnosis not present

## 2020-03-12 DIAGNOSIS — T783XXA Angioneurotic edema, initial encounter: Secondary | ICD-10-CM | POA: Diagnosis not present

## 2020-03-12 DIAGNOSIS — G2581 Restless legs syndrome: Secondary | ICD-10-CM | POA: Diagnosis not present

## 2020-03-12 DIAGNOSIS — I10 Essential (primary) hypertension: Secondary | ICD-10-CM | POA: Diagnosis not present

## 2020-03-25 ENCOUNTER — Emergency Department (HOSPITAL_COMMUNITY)
Admission: EM | Admit: 2020-03-25 | Discharge: 2020-03-26 | Disposition: A | Discharge status: Home / Self Care | Payer: Medicare Other | Attending: Emergency Medicine | Admitting: Emergency Medicine

## 2020-03-25 ENCOUNTER — Emergency Department (HOSPITAL_COMMUNITY): Payer: Medicare Other

## 2020-03-25 ENCOUNTER — Encounter (HOSPITAL_COMMUNITY): Payer: Self-pay | Admitting: Emergency Medicine

## 2020-03-25 ENCOUNTER — Telehealth: Payer: Self-pay | Admitting: Physician Assistant

## 2020-03-25 ENCOUNTER — Other Ambulatory Visit: Payer: Self-pay

## 2020-03-25 DIAGNOSIS — I10 Essential (primary) hypertension: Secondary | ICD-10-CM | POA: Diagnosis not present

## 2020-03-25 DIAGNOSIS — Z79899 Other long term (current) drug therapy: Secondary | ICD-10-CM | POA: Insufficient documentation

## 2020-03-25 DIAGNOSIS — E1140 Type 2 diabetes mellitus with diabetic neuropathy, unspecified: Secondary | ICD-10-CM | POA: Insufficient documentation

## 2020-03-25 DIAGNOSIS — I4891 Unspecified atrial fibrillation: Secondary | ICD-10-CM | POA: Diagnosis not present

## 2020-03-25 DIAGNOSIS — Z7901 Long term (current) use of anticoagulants: Secondary | ICD-10-CM | POA: Diagnosis not present

## 2020-03-25 DIAGNOSIS — Z794 Long term (current) use of insulin: Secondary | ICD-10-CM | POA: Insufficient documentation

## 2020-03-25 DIAGNOSIS — Z7984 Long term (current) use of oral hypoglycemic drugs: Secondary | ICD-10-CM | POA: Insufficient documentation

## 2020-03-25 DIAGNOSIS — R002 Palpitations: Secondary | ICD-10-CM | POA: Diagnosis present

## 2020-03-25 LAB — CBC
HCT: 48.5 % — ABNORMAL HIGH (ref 36.0–46.0)
Hemoglobin: 15.3 g/dL — ABNORMAL HIGH (ref 12.0–15.0)
MCH: 28.5 pg (ref 26.0–34.0)
MCHC: 31.5 g/dL (ref 30.0–36.0)
MCV: 90.5 fL (ref 80.0–100.0)
Platelets: 318 10*3/uL (ref 150–400)
RBC: 5.36 MIL/uL — ABNORMAL HIGH (ref 3.87–5.11)
RDW: 14.2 % (ref 11.5–15.5)
WBC: 11.1 10*3/uL — ABNORMAL HIGH (ref 4.0–10.5)
nRBC: 0 % (ref 0.0–0.2)

## 2020-03-25 LAB — BASIC METABOLIC PANEL
Anion gap: 11 (ref 5–15)
BUN: 19 mg/dL (ref 8–23)
CO2: 24 mmol/L (ref 22–32)
Calcium: 9.3 mg/dL (ref 8.9–10.3)
Chloride: 102 mmol/L (ref 98–111)
Creatinine, Ser: 0.88 mg/dL (ref 0.44–1.00)
GFR, Estimated: >60 mL/min (ref >60)
Glucose, Bld: 235 mg/dL — ABNORMAL HIGH (ref 70–99)
Potassium: 3.5 mmol/L (ref 3.5–5.1)
Sodium: 137 mmol/L (ref 135–145)

## 2020-03-25 MED ORDER — METOPROLOL TARTRATE 5 MG/5ML IV SOLN: 5.0000 mg | Freq: Once | INTRAVENOUS | Status: DC
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
## 2020-03-25 NOTE — ED Triage Notes (Signed)
Pt with hx afib, reports she missed a dose of her medications this morning, this afternoon she started have pain in her throat and lightheadedness. Took her home meds, states they haven't started working.

## 2020-03-25 NOTE — Telephone Encounter (Addendum)
   The patient called the answering service this evening. Chart reviewed. Patient follows with Dr .Marlou Porch and also previously saw the afib clinic. I also have met the patient previously. H/o PAF, LBBB, HTN, OSA amongst history. Also had nonischemic nuc 2020 done for elevated troponin in the context of prior afib event. She generally has a lot of stress in caring for mother with dementia. This evening she was out at a restaurant when she had an "afib attack" with the symptoms she usually associates with afib which are palpitations, throat pain and shortness of breath. No overt chest pain. She did not want to call EMS because she was out with her mother alone and wasn't sure what they'd do with her mom. She came home. On the way home she had nausea and dry heaving. When she got home she realized she'd forgotten her carvedilol this morning. So she took her dose of that around 4:15, along with one PRN diltiazem 73m tablet. It is now 6:30 and she does not feel any better. HR remains irregular per her report, jumping around from 74-130bpm. She expressed desire to avoid ED. I asked her to check her BP. She asked me to call her back in 15 minutes so she could go get her cuff and check it without feeling rushed if she were on hold.  I called her back and she reports her BP is running on the lower range which is unusual for her. She checked it 3x, the first time 90 systolic, then 97 systolic, then 1241/99with HR jumping from 70s-126. I would be hesitant to redose another diltiazem now given these readings. She feels slightly better than she did earlier but still with continued throat discomfort. It concerns me that her blood pressure is softer than it typically is especially given her throat discomfort which was previously associated with positive troponins as well as n/v. It is challenging to know over the phone if this is simply her afib again or perhaps due to another process. As above, she is not eager to seek  emergency care. Per our discussion, she prefers to wait at home to see if her symptoms improve with a little more time, and if still feeling poorly around the 4 hour mark, she will proceed to the ED. She now has her granddaughter at home to watch her mom if needed.  In the event that symptoms totally resolve with the medication/time, I have preemptively made her a [place] whom she is familiar. Denies any URI sx or Covid concerns. Will cc to DButch Pennyand Dr. SMarlou Porchto make them aware.  Kristin Tennison PA-C

## 2020-03-26 ENCOUNTER — Encounter (HOSPITAL_COMMUNITY): Payer: Self-pay | Admitting: Nurse Practitioner

## 2020-03-26 ENCOUNTER — Ambulatory Visit (HOSPITAL_BASED_OUTPATIENT_CLINIC_OR_DEPARTMENT_OTHER)
Admission: RE | Admit: 2020-03-26 | Discharge: 2020-03-26 | Disposition: A | Discharge status: Home / Self Care | Payer: Medicare Other | Source: Ambulatory Visit | Attending: Nurse Practitioner | Admitting: Nurse Practitioner

## 2020-03-26 VITALS — BP 170/92 | HR 76 | Ht 63.0 in | Wt 282.4 lb

## 2020-03-26 DIAGNOSIS — D6869 Other thrombophilia: Secondary | ICD-10-CM

## 2020-03-26 DIAGNOSIS — I480 Paroxysmal atrial fibrillation: Secondary | ICD-10-CM

## 2020-03-26 DIAGNOSIS — E1140 Type 2 diabetes mellitus with diabetic neuropathy, unspecified: Secondary | ICD-10-CM | POA: Diagnosis not present

## 2020-03-26 DIAGNOSIS — Z79899 Other long term (current) drug therapy: Secondary | ICD-10-CM | POA: Diagnosis not present

## 2020-03-26 DIAGNOSIS — I4891 Unspecified atrial fibrillation: Secondary | ICD-10-CM | POA: Diagnosis not present

## 2020-03-26 DIAGNOSIS — Z7984 Long term (current) use of oral hypoglycemic drugs: Secondary | ICD-10-CM | POA: Diagnosis not present

## 2020-03-26 DIAGNOSIS — Z7901 Long term (current) use of anticoagulants: Secondary | ICD-10-CM | POA: Diagnosis not present

## 2020-03-26 DIAGNOSIS — I10 Essential (primary) hypertension: Secondary | ICD-10-CM | POA: Diagnosis not present

## 2020-03-26 DIAGNOSIS — Z794 Long term (current) use of insulin: Secondary | ICD-10-CM | POA: Diagnosis not present

## 2020-03-26 NOTE — Telephone Encounter (Signed)
Agree with plan. Thanks Candee Furbish, MD
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
## 2020-03-26 NOTE — Discharge Instructions (Addendum)
Continue your medications as prescribed. Please follow up with cardiology, Dr. Marlou Porch, by calling the office to make them aware of your ED visit. Follow up should be directed by him.   Return to the emergency department with any recurrent symptoms or new concerns.

## 2020-04-16 ENCOUNTER — Ambulatory Visit: Payer: Medicare Other | Admitting: Cardiology

## 2020-04-16 ENCOUNTER — Telehealth: Payer: Self-pay | Admitting: Cardiology

## 2020-04-16 NOTE — Telephone Encounter (Signed)
I am fine with her taking an additional diltiazem to help with rate control.  Thank you for the update. Candee Furbish, MD

## 2020-04-16 NOTE — Telephone Encounter (Signed)
Patient c/o Palpitations:  High priority if patient c/o lightheadedness, shortness of breath, or chest pain  1) How long have you had palpitations/irregular HR/ Afib? Are you having the symptoms now? Since 5:30 this morning, yes  2) Are you currently experiencing lightheadedness, SOB or CP? Having SOB, lightheaded  3) Do you have a history of afib (atrial fibrillation) or irregular heart rhythm? Yes, 3rd episode in 3 weeks  4) Have you checked your BP or HR? (document readings if available):  BP cuff not working, HR  101, 103, 73  5) Are you experiencing any other symptoms? Pain in throat   Patient states she has been in afib since around 5:30 this morning. She states she is feeling lightheaded and has SOB. She states she also threw up the morning, but started taking ozempic and is not sure if that caused it. She states it makes her nausea, but afib does as well.

## 2020-04-16 NOTE — Telephone Encounter (Signed)
Pt states she has felt like she is in AF since 0530.  HR has been running over 100 since this started.  Denies CP.  Does have SOB, lightheadedness, nausea and pain in throat.  Pt states nausea and pain in throat are normal for her when she is in AF.  Pt admits that she does not take her medication as prescribed.  She occasionally misses doses or takes them late.  Took Coreg late yesterday because she was out during the day and got back late.  She took her PRN Diltiazem at 6am and 8am.  Wanted to know if she could take another.  Advised her not to because she is suppose to wait 4 hours between doses.  Offered an appt today at 1140 or 3pm with Dr. Marlou Porch.  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 She also felt that she couldn't wait until 3pm to be seen. Pt agreeable to schedule appt at 1140 with Dr. Marlou Porch for eval.  Advised to call back if unable to make appt.  Pt appreciative for assistance.

## 2020-04-16 NOTE — Telephone Encounter (Signed)
Left detailed message letting pt know ok to take another Diltiazem if still having trouble.  Advised to call back and make an appt if she felt it was needed or if she has any questions.

## 2020-04-18 NOTE — Telephone Encounter (Signed)
Pt has not called back since detail message with instruction were left on 04/16/2020.  Will close this encounter for now and await a return call from pt.

## 2020-06-14 ENCOUNTER — Encounter (HOSPITAL_COMMUNITY): Payer: Self-pay

## 2020-06-14 ENCOUNTER — Emergency Department (HOSPITAL_COMMUNITY): Payer: Medicare Other

## 2020-06-14 ENCOUNTER — Emergency Department (HOSPITAL_COMMUNITY)
Admission: EM | Admit: 2020-06-14 | Discharge: 2020-06-14 | Disposition: A | Discharge status: Home / Self Care | Payer: Medicare Other | Attending: Emergency Medicine | Admitting: Emergency Medicine

## 2020-06-14 ENCOUNTER — Other Ambulatory Visit: Payer: Self-pay

## 2020-06-14 DIAGNOSIS — Z79899 Other long term (current) drug therapy: Secondary | ICD-10-CM | POA: Diagnosis not present

## 2020-06-14 DIAGNOSIS — Z7901 Long term (current) use of anticoagulants: Secondary | ICD-10-CM | POA: Insufficient documentation

## 2020-06-14 DIAGNOSIS — E1142 Type 2 diabetes mellitus with diabetic polyneuropathy: Secondary | ICD-10-CM | POA: Insufficient documentation

## 2020-06-14 DIAGNOSIS — I4891 Unspecified atrial fibrillation: Secondary | ICD-10-CM | POA: Insufficient documentation

## 2020-06-14 DIAGNOSIS — I10 Essential (primary) hypertension: Secondary | ICD-10-CM | POA: Diagnosis not present

## 2020-06-14 LAB — CBC
HCT: 46.7 % — ABNORMAL HIGH (ref 36.0–46.0)
Hemoglobin: 15 g/dL (ref 12.0–15.0)
MCH: 28.8 pg (ref 26.0–34.0)
MCHC: 32.1 g/dL (ref 30.0–36.0)
MCV: 89.8 fL (ref 80.0–100.0)
Platelets: 226 10*3/uL (ref 150–400)
RBC: 5.2 MIL/uL — ABNORMAL HIGH (ref 3.87–5.11)
RDW: 14.4 % (ref 11.5–15.5)
WBC: 8.2 10*3/uL (ref 4.0–10.5)
nRBC: 0 % (ref 0.0–0.2)

## 2020-06-14 LAB — COMPREHENSIVE METABOLIC PANEL
ALT: 19 U/L (ref 0–44)
AST: 15 U/L (ref 15–41)
Albumin: 3.2 g/dL — ABNORMAL LOW (ref 3.5–5.0)
Alkaline Phosphatase: 64 U/L (ref 38–126)
Anion gap: 6 (ref 5–15)
BUN: 13 mg/dL (ref 8–23)
CO2: 24 mmol/L (ref 22–32)
Calcium: 8.2 mg/dL — ABNORMAL LOW (ref 8.9–10.3)
Chloride: 106 mmol/L (ref 98–111)
Creatinine, Ser: 0.67 mg/dL (ref 0.44–1.00)
GFR, Estimated: >60 mL/min (ref >60)
Glucose, Bld: 191 mg/dL — ABNORMAL HIGH (ref 70–99)
Potassium: 3.6 mmol/L (ref 3.5–5.1)
Sodium: 136 mmol/L (ref 135–145)
Total Bilirubin: 0.8 mg/dL (ref 0.3–1.2)
Total Protein: 5.8 g/dL — ABNORMAL LOW (ref 6.5–8.1)
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
## 2020-06-14 NOTE — ED Notes (Signed)
Pt ambulatory to bathroom

## 2020-06-14 NOTE — ED Provider Notes (Signed)
I saw and evaluated the patient, reviewed the resident's note and I agree with the findings and plan.  EKG Interpretation  Date/Time:  Saturday June 14 2020 19:38:52 EDT Ventricular Rate:  79 PR Interval:    QRS Duration: 150 QT Interval:  407 QTC Calculation: 467 R Axis:   -57 Text Interpretation: Sinus rhythm Left bundle branch block No significant change since last tracing Confirmed by Lacretia Leigh ([id number]) on 06/14/2020 10:30:10 PM  Six 65-year-old female who presents with irregular heartbeat.  Has a history of A. fib.  Took diltiazem and now is in sinus rhythm.  Monitored here and remained in sinus rhythm.  Will discharge home   Lacretia Leigh, MD 06/14/20 2236
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
## 2020-06-14 NOTE — Discharge Instructions (Signed)
-  Continue your home medications as prescribed. -Follow-up with your primary doctor and cardiology as regularly scheduled.

## 2020-06-23 ENCOUNTER — Telehealth: Payer: Self-pay | Admitting: Cardiology

## 2020-06-23 NOTE — Telephone Encounter (Signed)
Patient c/o Palpitations:  High priority if patient c/o lightheadedness, shortness of breath, or chest pain  1) How long have you had palpitations/irregular HR/ Afib? Are you having the symptoms now?  Patient states she has had several episodes of afib within the past few weeks. No symptoms currently.  2) Are you currently experiencing lightheadedness, SOB or CP?  Not currently, but patient states when she goes into afib she becomes lightheaded, SOB, and has CP  3) Do you have a history of afib (atrial fibrillation) or irregular heart rhythm?  Yes   4) Have you checked your BP or HR? (document readings if available):  Patient does not keep a log, but she states yesterday her HR was in the 80's  5) Are you experiencing any other symptoms?  Chest pain, lightheadedness/dizziness, nausea, SOB   Pt c/o of Chest Pain: STAT if CP now or developed within 24 hours  1. Are you having CP right now? No  2. Are you experiencing any other symptoms (ex. SOB, nausea, vomiting, sweating)? SOB, lightheadedness/dizziness, nausea  3. How long have you been experiencing CP? About 1 week, mainly occurs when she goes into afib  4. Is your CP continuous or coming and going? Coming and going   5. Have you taken Nitroglycerin? No    Pt c/o Shortness Of Breath: STAT if SOB developed within the last 24 hours or pt is noticeably SOB on the phone  1. Are you currently SOB (can you hear that pt is SOB on the phone)? No   2. How long have you been experiencing SOB?  Past few weeks, patient states the SOB only occurs when she goes into afib or has panic attacks  3. Are you SOB when sitting or when up moving around?  With exertion  4. Are you currently experiencing any other symptoms? No  STAT if patient feels like he/she is going to faint   1) Are you dizzy now? No   2) Do you feel faint or have you passed out? No   3) Do you have any other symptoms? No  4) Have you checked your HR and BP (record  if available)?  Patient does not keep a log, but she states yesterday her HR was in the 80's    ?

## 2020-06-23 NOTE — Telephone Encounter (Signed)
Will call and offer pt appt to discuss.

## 2020-06-24 NOTE — Telephone Encounter (Signed)
Called and left message for patient to call back to schedule appt. 

## 2020-08-04 ENCOUNTER — Telehealth: Payer: Self-pay | Admitting: Cardiology

## 2020-08-04 MED ORDER — DILTIAZEM HCL 30 MG PO TABS: ORAL_TABLET | ORAL | 0 refills | Status: DC

## 2020-08-04 NOTE — Telephone Encounter (Signed)
Pt's medication was sent to pt's pharmacy as requested. Confirmation received.  °

## 2020-08-04 NOTE — Telephone Encounter (Signed)
*  STAT* If patient is at the pharmacy, call can be transferred to refill team.   1. Which medications need to be refilled? (please list name of each medication and dose if known) diltiazem (CARDIZEM) 30 MG tablet  2. Which pharmacy/location (including street and city if local pharmacy) is medication to be sent to? [facility] [place], [place]  3. Do they need a 30 day or 90 day supply? 90  Patient stated she only has one pill left

## 2020-08-06 ENCOUNTER — Encounter: Payer: Medicare Other | Admitting: Physician Assistant

## 2020-08-06 ENCOUNTER — Telehealth: Payer: Self-pay

## 2020-08-06 ENCOUNTER — Other Ambulatory Visit: Payer: Self-pay

## 2020-08-06 DIAGNOSIS — I480 Paroxysmal atrial fibrillation: Secondary | ICD-10-CM

## 2020-08-06 DIAGNOSIS — I10 Essential (primary) hypertension: Secondary | ICD-10-CM

## 2020-08-06 DIAGNOSIS — G4733 Obstructive sleep apnea (adult) (pediatric): Secondary | ICD-10-CM
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
## 2020-08-06 NOTE — Telephone Encounter (Signed)
I have attempted to contact this patient by phone several times and tried to call the other number listed on file. I lmtcb about video visit with Richardson Dopp, PA-C.

## 2020-08-20 ENCOUNTER — Other Ambulatory Visit: Payer: Self-pay

## 2020-08-20 ENCOUNTER — Encounter: Payer: Self-pay | Admitting: Cardiology

## 2020-08-20 ENCOUNTER — Ambulatory Visit: Payer: Medicare Other | Admitting: Cardiology

## 2020-08-20 VITALS — BP 140/72 | HR 75 | Ht 63.0 in | Wt 277.6 lb

## 2020-08-20 DIAGNOSIS — I10 Essential (primary) hypertension: Secondary | ICD-10-CM

## 2020-08-20 DIAGNOSIS — G4733 Obstructive sleep apnea (adult) (pediatric): Secondary | ICD-10-CM

## 2020-08-20 DIAGNOSIS — I480 Paroxysmal atrial fibrillation: Secondary | ICD-10-CM

## 2020-08-20 DIAGNOSIS — I447 Left bundle-branch block, unspecified: Secondary | ICD-10-CM

## 2020-08-20 DIAGNOSIS — E119 Type 2 diabetes mellitus without complications: Secondary | ICD-10-CM | POA: Diagnosis not present
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
## 2020-08-20 NOTE — Patient Instructions (Addendum)
Medication Instructions:  Your physician has recommended you make the following change in your medication:  1. START DILTIAZEM 120 MG DAILY.   *If you need a refill on your cardiac medications before your next appointment, please call your pharmacy*   Lab Work: NONE If you have labs (blood work) drawn today and your tests are completely normal, you will receive your results only by: [name] Kitchen MyChart Message (if you have MyChart) OR . A paper copy in the mail If you have any lab test that is abnormal or we need to change your treatment, we will call you to review the results.   Testing/Procedures: Your physician has requested that you have an echocardiogram. Echocardiography is a painless test that uses sound waves to create images of your heart. It provides your doctor with information about the size and shape of your heart and how well your heart's chambers and valves are working. This procedure takes approximately one hour. There are no restrictions for this procedure.  Follow-Up: At [facility], you and your health needs are our priority.  As part of our continuing mission to provide you with exceptional heart care, we have created designated Provider Care Teams.  These Care Teams include your primary Cardiologist (physician) and Advanced Practice Providers (APPs -  Physician Assistants and Nurse Practitioners) who all work together to provide you with the care you need, when you need it.  We recommend signing up for the patient portal called "MyChart".  Sign up information is provided on this After Visit Summary.  MyChart is used to connect with patients for Virtual Visits (Telemedicine).  Patients are able to view lab/test results, encounter notes, upcoming appointments, etc.  Non-urgent messages can be sent to your provider as well.   To learn more about what you can do with MyChart, go to [email or web address].    Your next appointment:   8-12 week(s)  The format for your next  appointment:   In Person  Provider:   You may see Candee Furbish, MD or one of the following Advanced Practice Providers on your designated Care Team:    Kathyrn Drown, NP  You have been referred to see and Electrophysiologist.  Your physician recommends that you schedule a follow-up appointment with Dr. Radford Pax next available.      Other Instructions  Echocardiogram An echocardiogram is a test that uses sound waves (ultrasound) to produce images of the heart. Images from an echocardiogram can provide important information about:  Heart size and shape.  The size and thickness and movement of your heart's walls.  Heart muscle function and strength.  Heart valve function or if you have stenosis. Stenosis is when the heart valves are too narrow.  If blood is flowing backward through the heart valves (regurgitation).  A tumor or infectious growth around the heart valves.  Areas of heart muscle that are not working well because of poor blood flow or injury from a heart attack.  Aneurysm detection. An aneurysm is a weak or damaged part of an artery wall. The wall bulges out from the normal force of blood pumping through the body. Tell a health care provider about:  Any allergies you have.  All medicines you are taking, including vitamins, herbs, eye drops, creams, and over-the-counter medicines.  Any blood disorders you have.  Any surgeries you have had.  Any medical conditions you have.  Whether you are pregnant or may be pregnant. What are the risks? Generally, this is a safe test. However, problems may  occur, including an allergic reaction to dye (contrast) that may be used during the test. What happens before the test? No specific preparation is needed. You may eat and drink normally. What happens during the test?  You will take off your clothes from the waist up and put on a hospital gown.  Electrodes or electrocardiogram (ECG)patches may be placed on your chest.  The electrodes or patches are then connected to a device that monitors your heart rate and rhythm.  You will lie down on a table for an ultrasound exam. A gel will be applied to your chest to help sound waves pass through your skin.  A handheld device, called a transducer, will be pressed against your chest and moved over your heart. The transducer produces sound waves that travel to your heart and bounce back (or "echo" back) to the transducer. These sound waves will be captured in real-time and changed into images of your heart that can be viewed on a video monitor. The images will be recorded on a computer and reviewed by your health care provider.  You may be asked to change positions or hold your breath for a short time. This makes it easier to get different views or better views of your heart.  In some cases, you may receive contrast through an IV in one of your veins. This can improve the quality of the pictures from your heart. The procedure may vary among health care providers and hospitals.   What can I expect after the test? You may return to your normal, everyday life, including diet, activities, and medicines, unless your health care provider tells you not to do that. Follow these instructions at home:  It is up to you to get the results of your test. Ask your health care provider, or the department that is doing the test, when your results will be ready.  Keep all follow-up visits. This is important. Summary  An echocardiogram is a test that uses sound waves (ultrasound) to produce images of the heart.  Images from an echocardiogram can provide important information about the size and shape of your heart, heart muscle function, heart valve function, and other possible heart problems.  You do not need to do anything to prepare before this test. You may eat and drink normally.  After the echocardiogram is completed, you may return to your normal, everyday life, unless your health  care provider tells you not to do that. This information is not intended to replace advice given to you by your health care provider. Make sure you discuss any questions you have with your health care provider. Document Revised: 11/06/2019 Document Reviewed: 11/06/2019 Elsevier Patient Education  2021 [employer].

## 2020-09-02 ENCOUNTER — Encounter: Payer: Self-pay | Admitting: Cardiology

## 2020-09-05 ENCOUNTER — Telehealth: Payer: Self-pay | Admitting: Cardiology

## 2020-09-05 MED ORDER — DILTIAZEM HCL ER COATED BEADS 120 MG PO CP24: 120.0000 mg | ORAL_CAPSULE | Freq: Every day | ORAL | 3 refills | Status: DC

## 2020-09-05 NOTE — Telephone Encounter (Signed)
     Pt c/o medication issue:  1. Name of Medication: diltiazem 120 mg  2. How are you currently taking this medication (dosage and times per day)?   3. Are you having a reaction (difficulty breathing--STAT)?   4. What is your medication issue? Pt is calling, she said when she saw Sharee Pimple she added 120 mg for her diltiazem and that's the prescription she needs refill. 120 mg diltiazem is not on her meds list

## 2020-09-05 NOTE — Telephone Encounter (Signed)
*  STAT* If patient is at the pharmacy, call can be transferred to refill team.   1. Which medications need to be refilled? (please list name of each medication and dose if known) diltiazem (CARDIZEM) 30 MG tablet  2. Which pharmacy/location (including street and city if local pharmacy) is medication to be sent to? [facility] [place], [place]  3. Do they need a 30 day or 90 day supply? [place]

## 2020-09-05 NOTE — Telephone Encounter (Signed)
Left message for patient advising that prescription has been sent in.

## 2020-09-05 NOTE — Telephone Encounter (Signed)
Pt's medication has already been sent to pt's pharmacy as requested. Confirmation received.  

## 2020-09-17 ENCOUNTER — Encounter (HOSPITAL_BASED_OUTPATIENT_CLINIC_OR_DEPARTMENT_OTHER): Payer: Self-pay | Admitting: Emergency Medicine

## 2020-09-17 ENCOUNTER — Telehealth: Payer: Self-pay | Admitting: Cardiology

## 2020-09-17 ENCOUNTER — Emergency Department (HOSPITAL_BASED_OUTPATIENT_CLINIC_OR_DEPARTMENT_OTHER): Payer: Medicare Other

## 2020-09-17 ENCOUNTER — Other Ambulatory Visit: Payer: Self-pay

## 2020-09-17 ENCOUNTER — Emergency Department (HOSPITAL_BASED_OUTPATIENT_CLINIC_OR_DEPARTMENT_OTHER)
Admission: EM | Admit: 2020-09-17 | Discharge: 2020-09-18 | Disposition: A | Discharge status: Home / Self Care | Payer: Medicare Other | Source: Home / Self Care | Attending: Emergency Medicine | Admitting: Emergency Medicine

## 2020-09-17 DIAGNOSIS — Z20822 Contact with and (suspected) exposure to covid-19: Secondary | ICD-10-CM | POA: Insufficient documentation

## 2020-09-17 DIAGNOSIS — K5130 Ulcerative (chronic) rectosigmoiditis without complications: Secondary | ICD-10-CM | POA: Diagnosis not present

## 2020-09-17 DIAGNOSIS — K625 Hemorrhage of anus and rectum: Secondary | ICD-10-CM | POA: Diagnosis not present

## 2020-09-17 DIAGNOSIS — I480 Paroxysmal atrial fibrillation: Secondary | ICD-10-CM

## 2020-09-17 DIAGNOSIS — Z7901 Long term (current) use of anticoagulants: Secondary | ICD-10-CM | POA: Insufficient documentation

## 2020-09-17 DIAGNOSIS — Z79899 Other long term (current) drug therapy: Secondary | ICD-10-CM | POA: Insufficient documentation

## 2020-09-17 DIAGNOSIS — I10 Essential (primary) hypertension: Secondary | ICD-10-CM | POA: Insufficient documentation

## 2020-09-17 DIAGNOSIS — E1140 Type 2 diabetes mellitus with diabetic neuropathy, unspecified: Secondary | ICD-10-CM | POA: Insufficient documentation

## 2020-09-17 DIAGNOSIS — Z7984 Long term (current) use of oral hypoglycemic drugs: Secondary | ICD-10-CM | POA: Insufficient documentation

## 2020-09-17 LAB — CBC WITH DIFFERENTIAL/PLATELET
Abs Immature Granulocytes: 0.03 10*3/uL (ref 0.00–0.07)
Basophils Absolute: 0 10*3/uL (ref 0.0–0.1)
Basophils Relative: 0 %
Eosinophils Absolute: 0.1 10*3/uL (ref 0.0–0.5)
Eosinophils Relative: 1 %
HCT: 47.4 % — ABNORMAL HIGH (ref 36.0–46.0)
Hemoglobin: 15.6 g/dL — ABNORMAL HIGH (ref 12.0–15.0)
Immature Granulocytes: 0 %
Lymphocytes Relative: 9 %
Lymphs Abs: 0.9 10*3/uL (ref 0.7–4.0)
MCH: 29.2 pg (ref 26.0–34.0)
MCHC: 32.9 g/dL (ref 30.0–36.0)
MCV: 88.8 fL (ref 80.0–100.0)
Monocytes Absolute: 1 10*3/uL (ref 0.1–1.0)
Monocytes Relative: 9 %
Neutro Abs: 8.9 10*3/uL — ABNORMAL HIGH (ref 1.7–7.7)
Neutrophils Relative %: 81 %
Platelets: 247 10*3/uL (ref 150–400)
RBC: 5.34 MIL/uL — ABNORMAL HIGH (ref 3.87–5.11)
RDW: 16.3 % — ABNORMAL HIGH (ref 11.5–15.5)
WBC: 11 10*3/uL — ABNORMAL HIGH (ref 4.0–10.5)
nRBC: 0 % (ref 0.0–0.2)

## 2020-09-17 LAB — COMPREHENSIVE METABOLIC PANEL
ALT: 28 U/L (ref 0–44)
AST: 24 U/L (ref 15–41)
Albumin: 3.8 g/dL (ref 3.5–5.0)
Alkaline Phosphatase: 66 U/L (ref 38–126)
Anion gap: 14 (ref 5–15)
BUN: 29 mg/dL — ABNORMAL HIGH (ref 8–23)
CO2: 23 mmol/L (ref 22–32)
Calcium: 9.3 mg/dL (ref 8.9–10.3)
Chloride: 96 mmol/L — ABNORMAL LOW (ref 98–111)
Creatinine, Ser: 1.09 mg/dL — ABNORMAL HIGH (ref 0.44–1.00)
GFR, Estimated: 57 mL/min — ABNORMAL LOW (ref >60)
Glucose, Bld: 180 mg/dL — ABNORMAL HIGH (ref 70–99)
Potassium: 3.9 mmol/L (ref 3.5–5.1)
Sodium: 133 mmol/L — ABNORMAL LOW (ref 135–145)
Total Bilirubin: 0.9 mg/dL (ref 0.3–1.2)
Total Protein: 7 g/dL (ref 6.5–8.1)

## 2020-09-17 LAB — BRAIN NATRIURETIC PEPTIDE: B Natriuretic Peptide: 326 pg/mL — ABNORMAL HIGH (ref 0.0–100.0)

## 2020-09-17 LAB — TROPONIN I (HIGH SENSITIVITY): Troponin I (High Sensitivity): 21 ng/L — ABNORMAL HIGH (ref <18)

## 2020-09-17 MED ORDER — SODIUM CHLORIDE 0.9 % IV BOLUS
500.0000 mL | Freq: Once | INTRAVENOUS | Status: AC
  Administered 2020-09-17: 500 mL via INTRAVENOUS

## 2020-09-17 NOTE — ED Notes (Signed)
2nd EKG obtained. Pt appears to have converted to NSR

## 2020-09-17 NOTE — Telephone Encounter (Signed)
Attempted to contact pt to discuss her concerns.  Phone rang once and went to vm.  Left message to c/b to discuss.   Pt was last seen by Sharee Pimple 5/25.  She was ordered to start Diltiazem 120 mg daily and referred to EP (appt not until 7/21).

## 2020-09-17 NOTE — ED Notes (Signed)
Patient transported to X-ray 

## 2020-09-17 NOTE — Telephone Encounter (Signed)
   Pt calling because she has been in Afib x 3 days. She forgot to take her morning medications on the 19th and the 20th.  06/21 she thinks she took her meds.   On 06/21 or 06/22 at 5 am, she went into Afib.   She took alprazolam and some Dilt, not sure if short-acting or long-acting.  She followed her BP and SBP was 80s at times with HR occasionally < 60.  She feels very tired and gets short of breath walking across the room.  She is very symptomatic from the atrial fibrillation and is having no symptoms.  Her heart rate today has ranged from the 70s to the 90s.  Her blood pressure is a little bit higher now than it was, but she still feels very bad.  She wants to get out of atrial fibrillation.  I explained that I could not get her out of atrial fibrillation over the phone.  I explained that with her blood pressure dropping so low, I could not tell her to take additional medication safely.  I recommended that she come to the hospital and be evaluated.  She might not need to stay if we can make sure she is okay.  Her mother has dementia and this is a significant problem.  She states she cannot leave her mother alone.  She is an only child and has aunts with her own problems that are not able to help care for her mom.  She has some cousins she may be able to call.  Recommended that she try to find someone to come in and help with her mom and that she come to the hospital.  She states she will do her best.  Rosaria Ferries, PA-C 09/17/2020 7:20 PM
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
## 2020-09-17 NOTE — Telephone Encounter (Signed)
Patient c/o Palpitations:  High priority if patient c/o lightheadedness, shortness of breath, or chest pain  How long have you had palpitations/irregular HR/ Afib? Are you having the symptoms now? Yesterday, yes  Are you currently experiencing lightheadedness, SOB or CP? no  Do you have a history of afib (atrial fibrillation) or irregular heart rhythm? yes  Have you checked your BP or HR? (document readings if available): from the phone app it range from 54-150  Are you experiencing any other symptoms? Real bad hiccups, some SOB and CP, nauseas  ?

## 2020-09-17 NOTE — ED Notes (Signed)
Due to oxygen saturation of 89% on room air oxygen applied at 2 l/min via [place]

## 2020-09-17 NOTE — ED Triage Notes (Signed)
Pt states she did not take her daily medication on the 19th or the 20th  Pt states she took it this morning  Pt states she has been going into a fib off and on for the past 3 days  Pt states her blood pressure has been high and then low  Pt is c/o general weakness and feeling tired

## 2020-09-17 NOTE — ED Notes (Signed)
Patient placed on 2L [place] due to decrease in oxygen saturations to 87%. Post oxygen saturations are 95%. Patient tolerating well.

## 2020-09-18 LAB — TROPONIN I (HIGH SENSITIVITY): Troponin I (High Sensitivity): 18 ng/L — ABNORMAL HIGH (ref <18)

## 2020-09-18 LAB — URINALYSIS, ROUTINE W REFLEX MICROSCOPIC
Glucose, UA: >=500 mg/dL — AB
Ketones, ur: 15 mg/dL — AB
Leukocytes,Ua: NEGATIVE
Nitrite: NEGATIVE
Protein, ur: NEGATIVE mg/dL
Specific Gravity, Urine: 1.015 (ref 1.005–1.030)
pH: 5.5 (ref 5.0–8.0)

## 2020-09-18 LAB — TSH: TSH: 2.436 u[IU]/mL (ref 0.350–4.500)

## 2020-09-18 LAB — URINALYSIS, MICROSCOPIC (REFLEX)

## 2020-09-18 LAB — RESP PANEL BY RT-PCR (FLU A&B, COVID) ARPGX2
Influenza A by PCR: NEGATIVE
Influenza B by PCR: NEGATIVE
SARS Coronavirus 2 by RT PCR: NEGATIVE

## 2020-09-18 MED ORDER — SODIUM CHLORIDE 0.9 % IV BOLUS
500.0000 mL | Freq: Once | INTRAVENOUS | Status: AC
  Administered 2020-09-18: 500 mL via INTRAVENOUS

## 2020-09-18 NOTE — Telephone Encounter (Signed)
Pt reported to ED last night and was seen/treated for At Fib/hypotension. Last EKG demonstrated NSR HR 74 bpm.

## 2020-09-18 NOTE — ED Notes (Signed)
Water provided to pt for fluid challenge.

## 2020-09-18 NOTE — ED Notes (Signed)
Pt ambulated 1 lap around emergency dept without assistance. Sts chronic ankle pain that causes her to walk slow.

## 2020-09-18 NOTE — ED Notes (Signed)
MD reevaluated pt prior to dc. Pt 02 while asleep 86-89% ra. While awake 02 maintains 92-95%ra. Pt displays no distress. Encouraged to find transportation home but pt wants to drive herself.

## 2020-09-18 NOTE — Discharge Instructions (Addendum)
Call the atrial fibrillation clinic later this morning when they open.  Take your medications as prescribed.  If you develop palpitations again, you may take an extra 30 mg of your diltiazem.  Return to the ED with difficulty breathing, chest pain, any other concerns.

## 2020-09-19 ENCOUNTER — Ambulatory Visit (HOSPITAL_BASED_OUTPATIENT_CLINIC_OR_DEPARTMENT_OTHER): Payer: Medicare Other

## 2020-09-19 ENCOUNTER — Inpatient Hospital Stay (HOSPITAL_COMMUNITY)
Admission: EM | Admit: 2020-09-19 | Discharge: 2020-09-22 | DRG: 386 | Disposition: A | Discharge status: Home / Self Care | Payer: Medicare Other | Attending: Student | Admitting: Student

## 2020-09-19 ENCOUNTER — Other Ambulatory Visit: Payer: Self-pay

## 2020-09-19 ENCOUNTER — Encounter (HOSPITAL_COMMUNITY): Payer: Self-pay

## 2020-09-19 DIAGNOSIS — N393 Stress incontinence (female) (male): Secondary | ICD-10-CM | POA: Diagnosis present

## 2020-09-19 DIAGNOSIS — F419 Anxiety disorder, unspecified: Secondary | ICD-10-CM | POA: Diagnosis present

## 2020-09-19 DIAGNOSIS — N1831 Chronic kidney disease, stage 3a: Secondary | ICD-10-CM | POA: Diagnosis present

## 2020-09-19 DIAGNOSIS — Z8249 Family history of ischemic heart disease and other diseases of the circulatory system: Secondary | ICD-10-CM

## 2020-09-19 DIAGNOSIS — Z7901 Long term (current) use of anticoagulants: Secondary | ICD-10-CM

## 2020-09-19 DIAGNOSIS — Z7984 Long term (current) use of oral hypoglycemic drugs: Secondary | ICD-10-CM

## 2020-09-19 DIAGNOSIS — I10 Essential (primary) hypertension: Secondary | ICD-10-CM

## 2020-09-19 DIAGNOSIS — I480 Paroxysmal atrial fibrillation: Secondary | ICD-10-CM | POA: Diagnosis present

## 2020-09-19 DIAGNOSIS — Z885 Allergy status to narcotic agent status: Secondary | ICD-10-CM

## 2020-09-19 DIAGNOSIS — I959 Hypotension, unspecified: Secondary | ICD-10-CM | POA: Diagnosis not present

## 2020-09-19 DIAGNOSIS — F32A Depression, unspecified: Secondary | ICD-10-CM | POA: Diagnosis present

## 2020-09-19 DIAGNOSIS — Z888 Allergy status to other drugs, medicaments and biological substances status: Secondary | ICD-10-CM

## 2020-09-19 DIAGNOSIS — R053 Chronic cough: Secondary | ICD-10-CM | POA: Diagnosis present

## 2020-09-19 DIAGNOSIS — K5130 Ulcerative (chronic) rectosigmoiditis without complications: Principal | ICD-10-CM | POA: Diagnosis present

## 2020-09-19 DIAGNOSIS — Z20822 Contact with and (suspected) exposure to covid-19: Secondary | ICD-10-CM | POA: Diagnosis present

## 2020-09-19 DIAGNOSIS — G4733 Obstructive sleep apnea (adult) (pediatric): Secondary | ICD-10-CM | POA: Diagnosis present

## 2020-09-19 DIAGNOSIS — Z6841 Body Mass Index (BMI) 40.0 and over, adult: Secondary | ICD-10-CM

## 2020-09-19 DIAGNOSIS — R7989 Other specified abnormal findings of blood chemistry: Secondary | ICD-10-CM | POA: Diagnosis present

## 2020-09-19 DIAGNOSIS — N83201 Unspecified ovarian cyst, right side: Secondary | ICD-10-CM | POA: Diagnosis present

## 2020-09-19 DIAGNOSIS — Z9049 Acquired absence of other specified parts of digestive tract: Secondary | ICD-10-CM

## 2020-09-19 DIAGNOSIS — N179 Acute kidney failure, unspecified: Secondary | ICD-10-CM | POA: Diagnosis present

## 2020-09-19 DIAGNOSIS — E1142 Type 2 diabetes mellitus with diabetic polyneuropathy: Secondary | ICD-10-CM | POA: Diagnosis present

## 2020-09-19 DIAGNOSIS — Z9841 Cataract extraction status, right eye: Secondary | ICD-10-CM

## 2020-09-19 DIAGNOSIS — E1165 Type 2 diabetes mellitus with hyperglycemia: Secondary | ICD-10-CM | POA: Diagnosis present

## 2020-09-19 DIAGNOSIS — Z886 Allergy status to analgesic agent status: Secondary | ICD-10-CM

## 2020-09-19 DIAGNOSIS — Z881 Allergy status to other antibiotic agents status: Secondary | ICD-10-CM

## 2020-09-19 DIAGNOSIS — F988 Other specified behavioral and emotional disorders with onset usually occurring in childhood and adolescence: Secondary | ICD-10-CM | POA: Diagnosis present

## 2020-09-19 DIAGNOSIS — I447 Left bundle-branch block, unspecified: Secondary | ICD-10-CM | POA: Diagnosis present

## 2020-09-19 DIAGNOSIS — K219 Gastro-esophageal reflux disease without esophagitis: Secondary | ICD-10-CM | POA: Diagnosis present

## 2020-09-19 DIAGNOSIS — Z79899 Other long term (current) drug therapy: Secondary | ICD-10-CM

## 2020-09-19 DIAGNOSIS — G43909 Migraine, unspecified, not intractable, without status migrainosus: Secondary | ICD-10-CM | POA: Diagnosis present

## 2020-09-19 DIAGNOSIS — N83202 Unspecified ovarian cyst, left side: Secondary | ICD-10-CM | POA: Diagnosis present

## 2020-09-19 DIAGNOSIS — I129 Hypertensive chronic kidney disease with stage 1 through stage 4 chronic kidney disease, or unspecified chronic kidney disease: Secondary | ICD-10-CM | POA: Diagnosis present

## 2020-09-19 DIAGNOSIS — K922 Gastrointestinal hemorrhage, unspecified: Secondary | ICD-10-CM

## 2020-09-19 DIAGNOSIS — Z9842 Cataract extraction status, left eye: Secondary | ICD-10-CM

## 2020-09-19 DIAGNOSIS — Z87442 Personal history of urinary calculi: Secondary | ICD-10-CM

## 2020-09-19 DIAGNOSIS — F410 Panic disorder [episodic paroxysmal anxiety] without agoraphobia: Secondary | ICD-10-CM | POA: Diagnosis present

## 2020-09-19 DIAGNOSIS — I701 Atherosclerosis of renal artery: Secondary | ICD-10-CM | POA: Diagnosis present

## 2020-09-19 DIAGNOSIS — R531 Weakness: Secondary | ICD-10-CM | POA: Diagnosis present

## 2020-09-19 DIAGNOSIS — E785 Hyperlipidemia, unspecified: Secondary | ICD-10-CM | POA: Diagnosis present

## 2020-09-19 DIAGNOSIS — Z882 Allergy status to sulfonamides status: Secondary | ICD-10-CM

## 2020-09-19 DIAGNOSIS — B9689 Other specified bacterial agents as the cause of diseases classified elsewhere: Secondary | ICD-10-CM | POA: Diagnosis present

## 2020-09-19 DIAGNOSIS — E1122 Type 2 diabetes mellitus with diabetic chronic kidney disease: Secondary | ICD-10-CM | POA: Diagnosis present

## 2020-09-19 DIAGNOSIS — Z87820 Personal history of traumatic brain injury: Secondary | ICD-10-CM

## 2020-09-19 LAB — COMPREHENSIVE METABOLIC PANEL
ALT: 23 U/L (ref 0–44)
AST: 20 U/L (ref 15–41)
Albumin: 3.3 g/dL — ABNORMAL LOW (ref 3.5–5.0)
Alkaline Phosphatase: 51 U/L (ref 38–126)
Anion gap: 10 (ref 5–15)
BUN: 19 mg/dL (ref 8–23)
CO2: 24 mmol/L (ref 22–32)
Calcium: 9.1 mg/dL (ref 8.9–10.3)
Chloride: 101 mmol/L (ref 98–111)
Creatinine, Ser: 1.06 mg/dL — ABNORMAL HIGH (ref 0.44–1.00)
GFR, Estimated: 59 mL/min — ABNORMAL LOW (ref >60)
Glucose, Bld: 121 mg/dL — ABNORMAL HIGH (ref 70–99)
Potassium: 3.6 mmol/L (ref 3.5–5.1)
Sodium: 135 mmol/L (ref 135–145)
Total Bilirubin: 1.1 mg/dL (ref 0.3–1.2)
Total Protein: 6 g/dL — ABNORMAL LOW (ref 6.5–8.1)

## 2020-09-19 LAB — CBC WITH DIFFERENTIAL/PLATELET
Abs Immature Granulocytes: 0.02 10*3/uL (ref 0.00–0.07)
Basophils Absolute: 0 10*3/uL (ref 0.0–0.1)
Basophils Relative: 1 %
Eosinophils Absolute: 0 10*3/uL (ref 0.0–0.5)
Eosinophils Relative: 1 %
HCT: 44.5 % (ref 36.0–46.0)
Hemoglobin: 14.3 g/dL (ref 12.0–15.0)
Immature Granulocytes: 0 %
Lymphocytes Relative: 21 %
Lymphs Abs: 1 10*3/uL (ref 0.7–4.0)
MCH: 29 pg (ref 26.0–34.0)
MCHC: 32.1 g/dL (ref 30.0–36.0)
MCV: 90.3 fL (ref 80.0–100.0)
Monocytes Absolute: 0.8 10*3/uL (ref 0.1–1.0)
Monocytes Relative: 17 %
Neutro Abs: 2.9 10*3/uL (ref 1.7–7.7)
Neutrophils Relative %: 60 %
Platelets: 200 10*3/uL (ref 150–400)
RBC: 4.93 MIL/uL (ref 3.87–5.11)
RDW: 16 % — ABNORMAL HIGH (ref 11.5–15.5)
WBC: 4.7 10*3/uL (ref 4.0–10.5)
nRBC: 0 % (ref 0.0–0.2)

## 2020-09-19 LAB — LIPASE, BLOOD: Lipase: 39 U/L (ref 11–51)

## 2020-09-19 LAB — APTT: aPTT: 37 seconds — ABNORMAL HIGH (ref 24–36)

## 2020-09-19 LAB — TYPE AND SCREEN
ABO/RH(D): A POS
Antibody Screen: NEGATIVE

## 2020-09-19 LAB — PROTIME-INR
INR: 2.4 — ABNORMAL HIGH (ref 0.8–1.2)
Prothrombin Time: 26.2 seconds — ABNORMAL HIGH (ref 11.4–15.2)

## 2020-09-19 LAB — ECHOCARDIOGRAM COMPLETE
Area-P 1/2: 3.37 cm2
S' Lateral: 3.2 cm

## 2020-09-19 LAB — POC OCCULT BLOOD, ED: Fecal Occult Bld: POSITIVE — AB

## 2020-09-19 MED ORDER — LACTATED RINGERS IV SOLN: INTRAVENOUS | Status: AC

## 2020-09-19 MED ORDER — CARVEDILOL 3.125 MG PO TABS
3.1250 mg | ORAL_TABLET | Freq: Two times a day (BID) | ORAL | Status: DC
  Filled 2020-09-19: qty 1

## 2020-09-19 MED ORDER — SODIUM CHLORIDE 0.9 % IV BOLUS
1000.0000 mL | Freq: Once | INTRAVENOUS | Status: AC
  Administered 2020-09-20: 1000 mL via INTRAVENOUS

## 2020-09-19 MED ORDER — SODIUM CHLORIDE 0.9 % IV BOLUS
1000.0000 mL | Freq: Once | INTRAVENOUS | Status: AC
  Administered 2020-09-19 (×2): 1000 mL via INTRAVENOUS

## 2020-09-19 NOTE — ED Triage Notes (Signed)
Pt comes from home via [facility], has had diarrhea for the past month, about an hour ago noticed bright red blood in the toilet, on Xalerto.
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
## 2020-09-20 ENCOUNTER — Observation Stay (HOSPITAL_COMMUNITY): Payer: Medicare Other

## 2020-09-20 DIAGNOSIS — A09 Infectious gastroenteritis and colitis, unspecified: Secondary | ICD-10-CM

## 2020-09-20 DIAGNOSIS — K922 Gastrointestinal hemorrhage, unspecified: Secondary | ICD-10-CM | POA: Diagnosis not present

## 2020-09-20 DIAGNOSIS — A045 Campylobacter enteritis: Secondary | ICD-10-CM | POA: Diagnosis not present

## 2020-09-20 DIAGNOSIS — I480 Paroxysmal atrial fibrillation: Secondary | ICD-10-CM

## 2020-09-20 DIAGNOSIS — F419 Anxiety disorder, unspecified: Secondary | ICD-10-CM

## 2020-09-20 DIAGNOSIS — R112 Nausea with vomiting, unspecified: Secondary | ICD-10-CM

## 2020-09-20 DIAGNOSIS — R531 Weakness: Secondary | ICD-10-CM

## 2020-09-20 DIAGNOSIS — E1165 Type 2 diabetes mellitus with hyperglycemia: Secondary | ICD-10-CM

## 2020-09-20 DIAGNOSIS — F32A Depression, unspecified: Secondary | ICD-10-CM

## 2020-09-20 LAB — GASTROINTESTINAL PANEL BY PCR, STOOL (REPLACES STOOL CULTURE)

## 2020-09-20 LAB — CBC
HCT: 41.7 % (ref 36.0–46.0)
Hemoglobin: 13.5 g/dL (ref 12.0–15.0)
MCH: 29 pg (ref 26.0–34.0)
MCHC: 32.4 g/dL (ref 30.0–36.0)
MCV: 89.7 fL (ref 80.0–100.0)
Platelets: 195 10*3/uL (ref 150–400)
RBC: 4.65 MIL/uL (ref 3.87–5.11)
RDW: 16.2 % — ABNORMAL HIGH (ref 11.5–15.5)
WBC: 4.6 10*3/uL (ref 4.0–10.5)
nRBC: 0 % (ref 0.0–0.2)

## 2020-09-20 LAB — COMPREHENSIVE METABOLIC PANEL
ALT: 21 U/L (ref 0–44)
AST: 18 U/L (ref 15–41)
Albumin: 3 g/dL — ABNORMAL LOW (ref 3.5–5.0)
Alkaline Phosphatase: 47 U/L (ref 38–126)
Anion gap: 7 (ref 5–15)
BUN: 16 mg/dL (ref 8–23)
CO2: 25 mmol/L (ref 22–32)
Calcium: 8.8 mg/dL — ABNORMAL LOW (ref 8.9–10.3)
Chloride: 106 mmol/L (ref 98–111)
Creatinine, Ser: 0.88 mg/dL (ref 0.44–1.00)
GFR, Estimated: >60 mL/min (ref >60)
Glucose, Bld: 113 mg/dL — ABNORMAL HIGH (ref 70–99)
Potassium: 4.3 mmol/L (ref 3.5–5.1)
Sodium: 138 mmol/L (ref 135–145)
Total Bilirubin: 1.2 mg/dL (ref 0.3–1.2)
Total Protein: 5.5 g/dL — ABNORMAL LOW (ref 6.5–8.1)

## 2020-09-20 LAB — HEMOGLOBIN AND HEMATOCRIT, BLOOD
HCT: 40.5 % (ref 36.0–46.0)
HCT: 40.1 % (ref 36.0–46.0)
Hemoglobin: 13.1 g/dL (ref 12.0–15.0)
Hemoglobin: 12.9 g/dL (ref 12.0–15.0)

## 2020-09-20 LAB — SARS CORONAVIRUS 2 (TAT 6-24 HRS): SARS Coronavirus 2: NEGATIVE

## 2020-09-20 LAB — MAGNESIUM: Magnesium: 1.7 mg/dL (ref 1.7–2.4)

## 2020-09-20 LAB — ABO/RH: ABO/RH(D): A POS

## 2020-09-20 LAB — PHOSPHORUS: Phosphorus: 3.2 mg/dL (ref 2.5–4.6)

## 2020-09-20 LAB — GLUCOSE, CAPILLARY
Glucose-Capillary: 120 mg/dL — ABNORMAL HIGH (ref 70–99)
Glucose-Capillary: 84 mg/dL (ref 70–99)
Glucose-Capillary: 84 mg/dL (ref 70–99)
Glucose-Capillary: 123 mg/dL — ABNORMAL HIGH (ref 70–99)
Glucose-Capillary: 107 mg/dL — ABNORMAL HIGH (ref 70–99)

## 2020-09-20 LAB — HIV ANTIBODY (ROUTINE TESTING W REFLEX): HIV Screen 4th Generation wRfx: NONREACTIVE

## 2020-09-20 LAB — C DIFFICILE QUICK SCREEN W PCR REFLEX
C Diff antigen: NEGATIVE
C Diff interpretation: NOT DETECTED
C Diff toxin: NEGATIVE

## 2020-09-20 LAB — HEMOGLOBIN A1C
Hgb A1c MFr Bld: 7.5 % — ABNORMAL HIGH (ref 4.8–5.6)
Mean Plasma Glucose: 168.55 mg/dL

## 2020-09-20 MED ORDER — INSULIN ASPART 100 UNIT/ML IJ SOLN: 0.0000 [IU] | INTRAMUSCULAR | Status: DC

## 2020-09-20 MED ORDER — INSULIN ASPART 100 UNIT/ML IJ SOLN
0.0000 [IU] | Freq: Three times a day (TID) | INTRAMUSCULAR | Status: DC
  Administered 2020-09-20: 1 [IU] via SUBCUTANEOUS
  Administered 2020-09-21: 2 [IU] via SUBCUTANEOUS
  Administered 2020-09-21 – 2020-09-22 (×2): 1 [IU] via SUBCUTANEOUS
  Administered 2020-09-22: 2 [IU] via SUBCUTANEOUS

## 2020-09-20 MED ORDER — INSULIN ASPART 100 UNIT/ML IJ SOLN
2.0000 [IU] | Freq: Three times a day (TID) | INTRAMUSCULAR | Status: DC
  Administered 2020-09-20 – 2020-09-22 (×4): 2 [IU] via SUBCUTANEOUS

## 2020-09-20 MED ORDER — OMEPRAZOLE MAGNESIUM 20 MG PO TBEC: 20.0000 mg | DELAYED_RELEASE_TABLET | Freq: Every day | ORAL | Status: DC

## 2020-09-20 MED ORDER — ROPINIROLE HCL 1 MG PO TABS
2.0000 mg | ORAL_TABLET | Freq: Every day | ORAL | Status: DC
  Administered 2020-09-20 – 2020-09-21 (×3): 2 mg via ORAL
  Filled 2020-09-20 (×3): qty 2

## 2020-09-20 MED ORDER — AZITHROMYCIN 500 MG PO TABS
500.0000 mg | ORAL_TABLET | Freq: Every day | ORAL | Status: AC
  Administered 2020-09-20 – 2020-09-22 (×3): 500 mg via ORAL
  Filled 2020-09-20 (×3): qty 1

## 2020-09-20 MED ORDER — ALPRAZOLAM 0.25 MG PO TABS
0.2500 mg | ORAL_TABLET | Freq: Every day | ORAL | Status: DC | PRN
  Administered 2020-09-22: 0.25 mg via ORAL
  Filled 2020-09-20: qty 1

## 2020-09-20 MED ORDER — INSULIN ASPART 100 UNIT/ML IJ SOLN: 0.0000 [IU] | Freq: Every day | INTRAMUSCULAR | Status: DC

## 2020-09-20 MED ORDER — ACETAMINOPHEN 325 MG PO TABS
650.0000 mg | ORAL_TABLET | Freq: Four times a day (QID) | ORAL | Status: DC | PRN
  Administered 2020-09-20 – 2020-09-22 (×5): 650 mg via ORAL
  Filled 2020-09-20 (×5): qty 2

## 2020-09-20 MED ORDER — PANTOPRAZOLE SODIUM 40 MG IV SOLR
40.0000 mg | Freq: Every day | INTRAVENOUS | Status: DC
  Administered 2020-09-20 – 2020-09-21 (×3): 40 mg via INTRAVENOUS
  Filled 2020-09-20 (×3): qty 40

## 2020-09-20 MED ORDER — ONDANSETRON HCL 4 MG/2ML IJ SOLN
4.0000 mg | Freq: Four times a day (QID) | INTRAMUSCULAR | Status: DC | PRN
  Administered 2020-09-20 – 2020-09-22 (×2): 4 mg via INTRAVENOUS
  Filled 2020-09-20 (×2): qty 2

## 2020-09-20 MED ORDER — METHYLPHENIDATE HCL 5 MG PO TABS: 10.0000 mg | ORAL_TABLET | ORAL | Status: DC | PRN

## 2020-09-20 MED ORDER — BUPROPION HCL ER (XL) 300 MG PO TB24
300.0000 mg | ORAL_TABLET | Freq: Every morning | ORAL | Status: DC
  Administered 2020-09-20 – 2020-09-22 (×3): 300 mg via ORAL
  Filled 2020-09-20 (×3): qty 1

## 2020-09-20 MED ORDER — ATORVASTATIN CALCIUM 10 MG PO TABS
20.0000 mg | ORAL_TABLET | Freq: Every day | ORAL | Status: DC
  Administered 2020-09-20 – 2020-09-21 (×2): 20 mg via ORAL
  Filled 2020-09-20 (×3): qty 2

## 2020-09-20 MED ORDER — ROPINIROLE HCL 1 MG PO TABS
1.0000 mg | ORAL_TABLET | Freq: Every day | ORAL | Status: DC | PRN
  Administered 2020-09-20: 1 mg via ORAL
  Filled 2020-09-20: qty 1

## 2020-09-20 MED ORDER — MELATONIN 3 MG PO TABS: 3.0000 mg | ORAL_TABLET | Freq: Every evening | ORAL | Status: DC | PRN

## 2020-09-20 MED ORDER — POLYETHYLENE GLYCOL 3350 17 G PO PACK: 17.0000 g | PACK | Freq: Every day | ORAL | Status: DC | PRN

## 2020-09-20 MED ORDER — IOHEXOL 350 MG/ML SOLN
100.0000 mL | Freq: Once | INTRAVENOUS | Status: AC | PRN
  Administered 2020-09-20: 100 mL via INTRAVENOUS

## 2020-09-20 NOTE — Progress Notes (Signed)
PROGRESS NOTE  Kristin [name] OVF:[id number] DOB: 02-05-1956   PCP: Harlan Stains, MD  Patient is from: Home.  Lives alone.  Ambulates independently at baseline.  DOA: 09/19/2020 LOS: 0  Chief complaints:  Chief Complaint  Patient presents with   GI Bleeding     Brief Narrative / Interim history: 65 year old F with history of DM-2, paroxysmal A. fib on Xarelto, HTN, diarrhea, anxiety and depression, remote history of rectovaginal fistula s/p colostomy and reversal in 1986, ovarian cyst, stool and urine incontinence, and morbid obesity presenting with acute rectal bleed that has started the day of presentation.  Patient has nausea, intermittent vomiting, diarrhea and fatigue for about 2 months as well.  Hemoccult was positive.  Hgb 14.3 (baseline 15.6).  CT abdomen and pelvis concerning for proctocolitis and 50% right renal artery stenosis.  On call GI consulted.   Patient is followed by Dr. Collene Mares at [place].  She says she had colonoscopy about 5 to 6 years ago that showed polyps which were resected.   Subjective: Seen and examined earlier this morning.  No major events overnight of this morning.  Continues to have diarrhea/incontinence.  No other complaint this morning.  She has been n.p.o. and asking when she could have something to eat or drink.  Denies chest pain, dyspnea, nausea, emesis or abdominal pain.   Objective: Vitals:   09/20/20 0015 09/20/20 0030 09/20/20 0132 09/20/20 0851  BP: 101/62 (!) 107/56 (!) 112/48 (!) 102/48  Pulse: 69 72 71 63  Resp: 18 16 18 18   Temp:   98.7 F (37.1 C) 97.6 F (36.4 C)  TempSrc:   Oral Oral  SpO2: 90% 96% 94% 96%  Weight:   118 kg   Height:   5' 4"  (1.626 m)     Intake/Output Summary (Last 24 hours) at 09/20/2020 1156 Last data filed at 09/20/2020 0600 Gross per 24 hour  Intake 1120.79 ml  Output 0 ml  Net 1120.79 ml   Filed Weights   09/20/20 0132  Weight: 118 kg    Examination:  GENERAL: No apparent distress.   Nontoxic. HEENT: MMM.  Vision and hearing grossly intact.  NECK: Supple.  No apparent JVD.  RESP: On RA.  No IWOB.  Fair aeration bilaterally. CVS:  RRR. Heart sounds normal.  ABD/GI/GU: BS+. Abd soft, NTND.  MSK/EXT:  Moves extremities. No apparent deformity. No edema.  SKIN: no apparent skin lesion or wound NEURO: Awake, alert and oriented appropriately.  No apparent focal neuro deficit. PSYCH: Calm. Normal affect.  Procedures:  None  Microbiology summarized: COVID-19 PCR nonreactive.  Assessment & Plan:  Acute painless hematochezia: due to proctocolitis?  Could be diverticular or hemorrhoidal.  Patient is on Xarelto for A. fib.  Has history of rectovaginal fistula in 1986.  H&H relatively stable but slightly hypotensive. Recent Labs    03/25/20 2144 06/14/20 1944 09/17/20 2258 09/19/20 2132 09/20/20 0139  HGB 15.3* 15.0 15.6* 14.3 13.5  -Continue holding Xarelto -Appreciate input by GI -Start clear liquid diet -Monitor H&H every 8 hours. -Check C. difficile and GIP given ongoing diarrhea   Chronic diarrhea, nausea and intermittent vomiting: CT raising concern for possible proctocolitis. -Check C. difficile and GI PCR as above -Continue gentle IV fluid -Continue PPI  for now   Generalized weakness fatigue suspect multifactorial: Could be due to the above. -Treat treatable causes as above -PT/OT eval    AKI on CKD-3A/azotemia: Likely prerenal from GI loss.  Also on Avapro and Lasix at  home which could contribute. Recent Labs    03/25/20 2144 06/14/20 1944 09/17/20 2258 09/19/20 2132 09/20/20 0139  BUN 19 13 29* 19 16  CREATININE 0.88 0.67 1.09* 1.06* 0.88  -Continue IV fluid -Avoid or minimize nephrotoxic meds   Paroxysmal A. fib on Xarelto: Rate controlled.  On Coreg, Cardizem -Hold Coreg and Cardizem -Hold Xarelto in the setting of GI bleed  Hypotension: Last BP 102/48.  Does not seem to be symptomatic lying down -Hold home Coreg, Cardizem, Avapro and  Lasix. -Continue IV fluid-increase rate to 75   Chronic anxiety/depression: Stable. Patient stopped taking Cymbalta on the day of presentation. Plan was for her Wellbutrin dose to be doubled by her psychiatrist. -Resume home Wellbutrin, Ativan and Ritalin   History of rectovaginal fistula in 1986 s/p colostomy and reversal at [facility] GYN exam 2 to 3 years ago and there was no evidence of recurrent rectovaginal fistula.   Self-reported large ovarian cyst-CT confirmed bilateral simple appearing stable adnexal cysts -Outpatient follow-up with [facility].   Uncontrolled DM-2: On Jardiance at home.  A1c 7.5%. Recent Labs  Lab 09/20/20 0343 09/20/20 0847 09/20/20 1147  GLUCAP 120* 84 84  -Continue SSI and a started   Morbid obesity Body mass index is 44.65 kg/m. -Encourage lifestyle change to lose weight.     DVT prophylaxis:  SCDs Start: 09/19/20 2314  Code Status: Full code Family Communication: Patient and/or RN. Available if any question.  Level of care: Telemetry Medical Status is: Observation  The patient will require care spanning > 2 midnights and should be moved to inpatient because: Hemodynamically unstable, IV treatments appropriate due to intensity of illness or inability to take PO, and Inpatient level of care appropriate due to severity of illness  Dispo:  Patient From: Home  Planned Disposition: Home  Medically stable for discharge: No      Consultants:  Gastroenterology   Sch Meds:  Scheduled Meds:  atorvastatin  20 mg Oral QHS   buPROPion  300 mg Oral q morning   insulin aspart  0-9 Units Subcutaneous Q4H   pantoprazole (PROTONIX) IV  40 mg Intravenous QHS   rOPINIRole  2 mg Oral QHS   Continuous Infusions:  lactated ringers 50 mL/hr at 09/20/20 0255   PRN Meds:.acetaminophen, ALPRAZolam, melatonin, methylphenidate, ondansetron (ZOFRAN) IV, polyethylene glycol, rOPINIRole  Antimicrobials: Anti-infectives (From admission, onward)    None         I have personally reviewed the following labs and images: CBC: Recent Labs  Lab 09/17/20 2258 09/19/20 2132 09/20/20 0139  WBC 11.0* 4.7 4.6  NEUTROABS 8.9* 2.9  --   HGB 15.6* 14.3 13.5  HCT 47.4* 44.5 41.7  MCV 88.8 90.3 89.7  PLT 247 200 195   BMP &GFR Recent Labs  Lab 09/17/20 2258 09/19/20 2132 09/20/20 0139  NA 133* 135 138  K 3.9 3.6 4.3  CL 96* 101 106  CO2 23 24 25   GLUCOSE 180* 121* 113*  BUN 29* 19 16  CREATININE 1.09* 1.06* 0.88  CALCIUM 9.3 9.1 8.8*  MG  --   --  1.7  PHOS  --   --  3.2   Estimated Creatinine Clearance: 82.6 mL/min (by C-G formula based on SCr of 0.88 mg/dL). Liver & Pancreas: Recent Labs  Lab 09/17/20 2258 09/19/20 2132 09/20/20 0139  AST 24 20 18   ALT 28 23 21   ALKPHOS 66 51 47  BILITOT 0.9 1.1 1.2  PROT 7.0 6.0* 5.5*  ALBUMIN 3.8 3.3* 3.0*  Recent Labs  Lab 09/19/20 2132  LIPASE 39   No results for input(s): AMMONIA in the last 168 hours. Diabetic: Recent Labs    09/20/20 0139  HGBA1C 7.5*   Recent Labs  Lab 09/20/20 0343 09/20/20 0847 09/20/20 1147  GLUCAP 120* 84 84   Cardiac Enzymes: No results for input(s): CKTOTAL, CKMB, CKMBINDEX, TROPONINI in the last 168 hours. No results for input(s): PROBNP in the last 8760 hours. Coagulation Profile: Recent Labs  Lab 09/19/20 2132  INR 2.4*   Thyroid Function Tests: Recent Labs    09/17/20 2258  TSH 2.436   Lipid Profile: No results for input(s): CHOL, HDL, LDLCALC, TRIG, CHOLHDL, LDLDIRECT in the last 72 hours. Anemia Panel: No results for input(s): VITAMINB12, FOLATE, FERRITIN, TIBC, IRON, RETICCTPCT in the last 72 hours. Urine analysis:    Component Value Date/Time   COLORURINE YELLOW 09/17/2020 2337   APPEARANCEUR CLEAR 09/17/2020 2337   LABSPEC 1.015 09/17/2020 2337   PHURINE 5.5 09/17/2020 2337   GLUCOSEU >=500 (A) 09/17/2020 2337   HGBUR MODERATE (A) 09/17/2020 2337   BILIRUBINUR MODERATE (A) 09/17/2020 2337   KETONESUR 15 (A)  09/17/2020 2337   PROTEINUR NEGATIVE 09/17/2020 2337   UROBILINOGEN 0.2 01/25/2014 1943   NITRITE NEGATIVE 09/17/2020 2337   LEUKOCYTESUR NEGATIVE 09/17/2020 2337   Sepsis Labs: Invalid input(s): PROCALCITONIN, [place]  Microbiology: Recent Results (from the past 240 hour(s))  Resp Panel by RT-PCR (Flu A&B, Covid) Nasopharyngeal Swab     Status: None   Collection Time: 09/17/20 11:37 PM   Specimen: Nasopharyngeal Swab; Nasopharyngeal(NP) swabs in vial transport medium  Result Value Ref Range Status   SARS Coronavirus 2 by RT PCR NEGATIVE NEGATIVE Final    Comment: (NOTE) SARS-CoV-2 target nucleic acids are NOT DETECTED.  The SARS-CoV-2 RNA is generally detectable in upper respiratory specimens during the acute phase of infection. The lowest concentration of SARS-CoV-2 viral copies this assay can detect is 138 copies/mL. A negative result does not preclude SARS-Cov-2 infection and should not be used as the sole basis for treatment or other patient management decisions. A negative result may occur with  improper specimen collection/handling, submission of specimen other than nasopharyngeal swab, presence of viral mutation(s) within the areas targeted by this assay, and inadequate number of viral copies(<138 copies/mL). A negative result must be combined with clinical observations, patient history, and epidemiological information. The expected result is Negative.  Fact Sheet for Patients:  EntrepreneurPulse.com.au  Fact Sheet for Healthcare Providers:  IncredibleEmployment.be  This test is no t yet approved or cleared by the [place] FDA and  has been authorized for detection and/or diagnosis of SARS-CoV-2 by FDA under an Emergency Use Authorization (EUA). This EUA will remain  in effect (meaning this test can be used) for the duration of the COVID-19 declaration under Section 564(b)(1) of the Act, 21 U.S.C.section 360bbb-3(b)(1),  unless the authorization is terminated  or revoked sooner.       Influenza A by PCR NEGATIVE NEGATIVE Final   Influenza B by PCR NEGATIVE NEGATIVE Final    Comment: (NOTE) The Xpert Xpress SARS-CoV-2/FLU/RSV plus assay is intended as an aid in the diagnosis of influenza from Nasopharyngeal swab specimens and should not be used as a sole basis for treatment. Nasal washings and aspirates are unacceptable for Xpert Xpress SARS-CoV-2/FLU/RSV testing.  Fact Sheet for Patients: EntrepreneurPulse.com.au  Fact Sheet for Healthcare Providers: IncredibleEmployment.be  This test is not yet approved or cleared by the [place] and has been authorized  for detection and/or diagnosis of SARS-CoV-2 by FDA under an Emergency Use Authorization (EUA). This EUA will remain in effect (meaning this test can be used) for the duration of the COVID-19 declaration under Section 564(b)(1) of the Act, 21 U.S.C. section 360bbb-3(b)(1), unless the authorization is terminated or revoked.  Performed at [facility], [address] [place], [place] [address]   SARS CORONAVIRUS 2 (TAT 6-24 HRS) Nasopharyngeal Nasopharyngeal Swab     Status: None   Collection Time: 09/20/20 12:15 AM   Specimen: Nasopharyngeal Swab  Result Value Ref Range Status   SARS Coronavirus 2 NEGATIVE NEGATIVE Final    Comment: (NOTE) SARS-CoV-2 target nucleic acids are NOT DETECTED.  The SARS-CoV-2 RNA is generally detectable in upper and lower respiratory specimens during the acute phase of infection. Negative results do not preclude SARS-CoV-2 infection, do not rule out co-infections with other pathogens, and should not be used as the sole basis for treatment or other patient management decisions. Negative results must be combined with clinical observations, patient history, and epidemiological information. The expected result is Negative.  Fact Sheet for  Patients: SugarRoll.be  Fact Sheet for Healthcare Providers: https://www.woods-mathews.com/  This test is not yet approved or cleared by the [place] FDA and  has been authorized for detection and/or diagnosis of SARS-CoV-2 by FDA under an Emergency Use Authorization (EUA). This EUA will remain  in effect (meaning this test can be used) for the duration of the COVID-19 declaration under Se ction 564(b)(1) of the Act, 21 U.S.C. section 360bbb-3(b)(1), unless the authorization is terminated or revoked sooner.  Performed at [facility], [place] [address], [place], [place] [address]     Radiology Studies: ECHOCARDIOGRAM COMPLETE  Result Date: 09/19/2020    ECHOCARDIOGRAM REPORT   Patient Name:   JAALA BOHLE Date of Exam: 09/19/2020 Medical Rec #:  [id number]       Height:       63.0 in Accession #:    [id number]      Weight:       277.8 lb Date of Birth:  10-Nov-1955        BSA:          2.223 m Patient Age:    61 years        BP:           146/79 mmHg Patient Gender: F               HR:           74 bpm. Exam Location:  [address] Procedure: 2D Echo and 3D Echo Indications:    Atrial Fibrillation  History:        Patient has prior history of Echocardiogram examinations, most                 recent 04/26/2018. Arrythmias:LBBB; Risk Factors:Hypertension,                 Dyslipidemia and Diabetes.  Sonographer:    Mikki Santee RDCS Referring Phys: Gravity  1. Abnormal septal motion . Left ventricular ejection fraction, by estimation, is 55%. The left ventricle has normal function. The left ventricle has no regional wall motion abnormalities. Left ventricular diastolic parameters were normal.  2. Right ventricular systolic function is normal. The right ventricular size is normal.  3. The mitral valve is normal in structure. Trivial mitral valve regurgitation. No evidence of mitral stenosis.  4. The aortic valve  is  tricuspid. Aortic valve regurgitation is not visualized. No aortic stenosis is present.  5. The inferior vena cava is normal in size with greater than 50% respiratory variability, suggesting right atrial pressure of 3 mmHg. FINDINGS  Left Ventricle: Abnormal septal motion. Left ventricular ejection fraction, by estimation, is 55%. The left ventricle has normal function. The left ventricle has no regional wall motion abnormalities. The left ventricular internal cavity size was normal  in size. There is no left ventricular hypertrophy. Left ventricular diastolic parameters were normal. Right Ventricle: The right ventricular size is normal. No increase in right ventricular wall thickness. Right ventricular systolic function is normal. Left Atrium: Left atrial size was normal in size. Right Atrium: Right atrial size was normal in size. Pericardium: There is no evidence of pericardial effusion. Mitral Valve: The mitral valve is normal in structure. Trivial mitral valve regurgitation. No evidence of mitral valve stenosis. Tricuspid Valve: The tricuspid valve is normal in structure. Tricuspid valve regurgitation is trivial. No evidence of tricuspid stenosis. Aortic Valve: The aortic valve is tricuspid. Aortic valve regurgitation is not visualized. No aortic stenosis is present. Pulmonic Valve: The pulmonic valve was normal in structure. Pulmonic valve regurgitation is not visualized. No evidence of pulmonic stenosis. Aorta: The aortic root is normal in size and structure. Venous: The inferior vena cava is normal in size with greater than 50% respiratory variability, suggesting right atrial pressure of 3 mmHg. IAS/Shunts: No atrial level shunt detected by color flow Doppler.  LEFT VENTRICLE PLAX 2D LVIDd:         5.10 cm  Diastology LVIDs:         3.20 cm  LV e' medial:    6.81 cm/s LV PW:         1.10 cm  LV E/e' medial:  10.0 LV IVS:        1.10 cm  LV e' lateral:   6.00 cm/s LVOT diam:     2.10 cm  LV E/e' lateral: 11.3  LV SV:         81 LV SV Index:   36 LVOT Area:     3.46 cm  RIGHT VENTRICLE RV S prime:     13.30 cm/s TAPSE (M-mode): 2.8 cm LEFT ATRIUM             Index       RIGHT ATRIUM           Index LA diam:        3.40 cm 1.53 cm/m  RA Area:     13.00 cm LA Vol (A2C):   55.0 ml 24.74 ml/m RA Volume:   27.20 ml  12.23 ml/m LA Vol (A4C):   67.3 ml 30.27 ml/m LA Biplane Vol: 63.6 ml 28.61 ml/m  AORTIC VALVE LVOT Vmax:   117.00 cm/s LVOT Vmean:  75.000 cm/s LVOT VTI:    0.233 m  AORTA Ao Root diam: 3.10 cm MITRAL VALVE MV Area (PHT): 3.37 cm    SHUNTS MV Decel Time: 225 msec    Systemic VTI:  0.23 m MV E velocity: 67.90 cm/s  Systemic Diam: 2.10 cm MV A velocity: 72.90 cm/s MV E/A ratio:  0.93 Jenkins Rouge MD Electronically signed by Jenkins Rouge MD Signature Date/Time: 09/19/2020/1:55:11 PM    Final    CT Angio Abd/Pel w/ and/or w/o  Result Date: 09/20/2020 CLINICAL DATA:  Gastrointestinal hemorrhage, bright red blood per rectum, chronic anticoagulation EXAM: CTA ABDOMEN AND PELVIS WITHOUT AND WITH CONTRAST TECHNIQUE: Multidetector  CT imaging of the abdomen and pelvis was performed using the standard protocol during bolus administration of intravenous contrast. Multiplanar reconstructed images and MIPs were obtained and reviewed to evaluate the vascular anatomy. CONTRAST:  163m OMNIPAQUE IOHEXOL 350 MG/ML SOLN COMPARISON:  CT abdomen pelvis 01/25/2014 FINDINGS: VASCULAR Aorta: Normal caliber. No aneurysm or dissection. No evidence of hemodynamically significant stenosis. Mild atherosclerotic calcification. No periaortic inflammatory change. Celiac: Classic anatomic configuration. Widely patent. No aneurysm or dissection. SMA: Widely patent.  No aneurysm or dissection. Renals: Single renal arteries bilaterally. 50% stenosis of the right renal artery at its origin secondary to calcified atherosclerotic plaque. Left renal artery is widely patent. Normal vascular morphology. No aneurysm. IMA: Widely patent. Inflow:  Widely patent. No evidence of hemodynamically significant stenosis, aneurysm, or dissection. Internal iliac arteries are patent bilaterally. Proximal Outflow: Widely patent. Veins: Normal anatomic configuration.  Widely patent. Review of the MIP images confirms the above findings. NON-VASCULAR Lower chest: The visualized lung bases are clear bilaterally. Visualized heart and pericardium are unremarkable. Hepatobiliary: No focal liver abnormality is seen. Status post cholecystectomy. No biliary dilatation. Pancreas: Unremarkable Spleen: Unremarkable Adrenals/Urinary Tract: The adrenal glands are unremarkable. The kidneys are normal in size and position. 8 mm nonobstructing calculus is seen within the lower pole of the right kidney. No hydronephrosis. No ureteral calculi. The bladder is unremarkable. Stomach/Bowel: There is relative hyperemia involving the descending and rectosigmoid colon with mild pericolonic inflammatory stranding involving the descending: In keeping with changes of an underlying infectious or inflammatory colitis. Given the involvement of the rectum in wide patency of the a arterial and venous vascular structures centrally, ischemia is considered less likely. A superimposed spigelian hernia is seen within the left lower quadrant the abdomen containing a single loop of proximal sigmoid colon. There is no evidence of active extravasation identified. The stomach, small bowel, and large bowel are otherwise unremarkable. The appendix is unremarkable. No free intraperitoneal gas or fluid. Lymphatic: No pathologic adenopathy within the abdomen and pelvis. Reproductive: Bilateral simple adnexal cysts are identified measuring up to 5.4 cm on the left and 4.6 cm on the right, unchanged from prior examination. The uterus is unremarkable. Other: None significant Musculoskeletal: No acute bone abnormality. No lytic or blastic bone lesion identified. IMPRESSION: VASCULAR 50% stenosis of the right renal artery  at its origin. NON-VASCULAR No active extravasation identified involving the gastrointestinal tract. There is relative hyperemia involving the descending and rectosigmoid colon with superimposed pericolonic inflammatory stranding in keeping with a distal infectious or inflammatory proctocolitis. No evidence of obstruction. Superimposed spigelian hernia containing a single loop of proximal small bowel. This appears unchanged from prior examination. No evidence of obstruction. Mild right nonobstructing nephrolithiasis.  No urolithiasis. Bilateral simple appearing adnexal cyst, stable since remote prior examination and safely considered benign. Aortic Atherosclerosis (ICD10-I70.0). Electronically Signed   By: AFidela SalisburyMD   On: 09/20/2020 03:53      Lilyahna Sirmon T. GRaven If 7PM-7AM, please contact night-coverage [email or web address] 09/20/2020, 11:56 AM
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
## 2020-09-20 NOTE — Evaluation (Signed)
Physical Therapy Evaluation Patient Details Name: Kristin [name] MRN: [id number] DOB: 02-Oct-1955 Today's Date: 09/20/2020   History of Present Illness  65 year old F with history of DM-2, paroxysmal A. fib on Xarelto, HTN, diarrhea, anxiety and depression, remote history of rectovaginal fistula s/p colostomy and reversal in 1986, ovarian cyst, stool and urine incontinence, and morbid obesity presenting with acute rectal bleed that has started the day of presentation.  Patient has nausea, intermittent vomiting, diarrhea and fatigue for about 2 months as well.  Clinical Impression  Pt admitted with above diagnosis.  Pt currently with functional limitations due to the deficits listed below (see PT Problem List). Pt will benefit from skilled PT to increase their independence and safety with mobility to allow discharge to home.  Pt is likely close to baseline, but perhaps a bit weaker from diarrhea and being in bed. Pt did become nauseated with mobility and BP was checked three times during session with no significant findings.  Will f/u with pt next week id she is still in the hospital.     Follow Up Recommendations No PT follow up    Equipment Recommendations  None recommended by PT    Recommendations for Other Services       Precautions / Restrictions Precautions Precautions: Fall Precaution Comments: being tested for cdiff Restrictions Weight Bearing Restrictions: No      Mobility  Bed Mobility Overal bed mobility: Modified Independent             General bed mobility comments: Pt st up without assist with HOB elevated    Transfers Overall transfer level: Needs assistance   Transfers: Sit to/from Stand Sit to Stand: Supervision         General transfer comment: Mod I with transfer to bsc  Ambulation/Gait Ambulation/Gait assistance: Supervision Gait Distance (Feet): 15 Feet Assistive device: None Gait Pattern/deviations: Step-through pattern;Decreased stride length  (Walked in a small area so difficult to assess true gait pattern)     General Gait Details: Pt with c/o nausea with standing and gait. 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 Her mother is also currently hospitalized.) Available Help at Discharge: Available PRN/intermittently;Family Type of Home: House Home Access: Stairs to enter Entrance Stairs-Rails: None Entrance Stairs-Number of Steps: 4 Home Layout: One level Home Equipment: [place] - 4 wheels;Walker - 2 wheels;Walker - standard;Cane - single point;Wheelchair - [employer];Shower seat      Prior Function Level of Independence: Independent with assistive device(s)         Comments: Pt. would use cane for uneven surfaces. She had wc for when she went shopping.     Hand Dominance   Dominant Hand: Right    Extremity/Trunk Assessment  Upper Extremity Assessment Upper Extremity Assessment: Defer to OT evaluation    Lower Extremity Assessment Lower Extremity Assessment: Generalized weakness (Pt feels weaker than normal, likely d/t GI issues)    Cervical / Trunk Assessment Cervical / Trunk Assessment: Normal  Communication   Communication: No difficulties  Cognition Arousal/Alertness: Awake/alert Behavior During Therapy: WFL for tasks assessed/performed Overall Cognitive Status: Within Functional Limits for tasks assessed                                         General Comments General comments (skin integrity, edema, etc.): Pt reports urinary incontinence at baseline and neded to use BSC during session.  This limited her gait distance.  She preferred a RN help her when going and when finished. She reports they had not allowed her to get to [facility] previously because of BP issues.     Exercises     Assessment/Plan    PT Assessment Patient needs continued PT services  PT Problem List Decreased strength;Decreased mobility;Decreased balance       PT Treatment Interventions Gait training;Therapeutic activities;Therapeutic exercise;Balance training;Patient/family education    PT Goals (Current goals can be found in the Care Plan section)  Acute Rehab PT Goals Patient Stated Goal: go home PT Goal Formulation: With patient Time For Goal Achievement: 09/27/20 Potential to Achieve Goals: Good    Frequency Min 3X/week   Barriers to discharge        Co-evaluation               AM-PAC PT "6 Clicks" Mobility  Outcome Measure Help needed turning from your back to your side while in a flat bed without using bedrails?: None Help needed moving from lying on your back to sitting on the side of a flat bed without using bedrails?: None Help needed moving to and from a bed to a chair (including a wheelchair)?: A Little Help needed standing up from a chair using your arms (e.g., wheelchair or bedside chair)?: A Little Help needed to walk in hospital room?: A Little Help needed climbing 3-5 steps with a railing? : A Little 6 Click Score: 20    End of Session Equipment Utilized During Treatment: Gait belt Activity Tolerance: Other (comment) (limited by nausea) Patient left: Other (comment) (on BSC with NT present)   PT Visit Diagnosis: Unsteadiness on feet (R26.81)    Time: 1443-1540 PT Time Calculation (min) (ACUTE ONLY): 22 min   Charges:   PT Evaluation $PT Eval Moderate Complexity: [place], PT   Acute Rehabilitation Services  Pager [phone] Office [phone] 09/20/2020   Melvern Banker 09/20/2020, 2:44 PM
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
## 2020-09-20 NOTE — Plan of Care (Signed)
  Problem: Education: Goal: Knowledge of General Education information will improve Description Including pain rating scale, medication(s)/side effects and non-pharmacologic comfort measures Outcome: Progressing   

## 2020-09-20 NOTE — ED Notes (Signed)
Report attempted. Will call back in [phone]

## 2020-09-20 NOTE — Evaluation (Signed)
Occupational Therapy Evaluation Patient Details Name: Kristin [name] MRN: [id number] DOB: [birth year] Today's Date: 09/20/2020    History of Present Illness 65 year old F with history of DM-2, paroxysmal A. fib on Xarelto, HTN, diarrhea, anxiety and depression, remote history of rectovaginal fistula s/p colostomy and reversal in 1986, ovarian cyst, stool and urine incontinence, and morbid obesity presenting with acute rectal bleed that has started the day of presentation.  Patient has nausea, intermittent vomiting, diarrhea and fatigue for about 2 months as well.   Clinical Impression    Pt. Was able to preform ADLs tasks. Pt. States she is able to care for herself. She states she is at baseline for ADLs and has all the dme she needs. No further skilled OT>   Follow Up Recommendations  No OT follow up    Equipment Recommendations  None recommended by OT    Recommendations for Other Services       Precautions / Restrictions Precautions Precautions: Fall Precaution Comments: being tested for cdiff Restrictions Weight Bearing Restrictions: No      Mobility Bed Mobility Overal bed mobility: Modified Independent             General bed mobility comments: Pt st up without assist with HOB elevated    Transfers Overall transfer level: Needs assistance   Transfers: Sit to/from Stand Sit to Stand: Supervision         General transfer comment: Mod I with transfer to bsc    Balance Overall balance assessment: Mild deficits observed, not formally tested (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 and her balance is not great at baseline)                                         ADL either performed or assessed with clinical judgement   ADL                                               Vision Baseline Vision/History: Wears glasses Wears Glasses: Reading only Patient Visual Report: No change from baseline       Perception     Praxis       Pertinent Vitals/Pain Pain Assessment: No/denies pain     Hand Dominance Right   Extremity/Trunk Assessment Upper Extremity Assessment Upper Extremity Assessment: Defer to OT evaluation   Lower Extremity Assessment Lower Extremity Assessment: Generalized weakness (Pt feels weaker than normal, likely d/t GI issues)   Cervical / Trunk Assessment Cervical / Trunk Assessment: Normal   Communication Communication Communication: No difficulties   Cognition Arousal/Alertness: Awake/alert Behavior During Therapy: WFL for tasks assessed/performed Overall Cognitive Status: Within Functional Limits for tasks assessed                                     General Comments  Pt reports urinary incontinence at baseline and neded to use BSC during session. She preferred a RN help her when going and when finished. She reports they had not allowed her to get to [facility] previously because of BP issues.    Exercises     Shoulder Instructions      Home Living Family/patient expects to be discharged to:: Private residence Living  Arrangements: Other (Comment) (Pt is primary caregiver for her mother who lives with her and has dementia. Her mother is also currently hospitalized.) Available Help at Discharge: Available PRN/intermittently;Family Type of Home: House Home Access: Stairs to enter [employer] of Steps: 4 Entrance Stairs-Rails: None Home Layout: One level     Bathroom Shower/Tub: Occupational psychologist: Handicapped height     Home Equipment: Environmental consultant - 4 wheels;Walker - 2 wheels;Walker - standard;Cane - single point;Wheelchair - [employer];Shower seat          Prior Functioning/Environment Level of Independence: Independent with assistive device(s)        Comments: Pt. would use cane for uneven surfaces. She had wc for when she went shopping.        OT Problem List:        OT Treatment/Interventions:      OT Goals(Current  goals can be found in the care plan section) Acute Rehab OT Goals Patient Stated Goal: go home  OT Frequency:     Barriers to D/C:            Co-evaluation              AM-PAC OT "6 Clicks" Daily Activity     Outcome Measure Help from another person eating meals?: None Help from another person taking care of personal grooming?: None Help from another person toileting, which includes using toliet, bedpan, or urinal?: None Help from another person bathing (including washing, rinsing, drying)?: None Help from another person to put on and taking off regular upper body clothing?: None Help from another person to put on and taking off regular lower body clothing?: None 6 Click Score: 24   End of Session Equipment Utilized During Treatment: Rolling walker Nurse Communication:  (ok therapy)  Activity Tolerance: Patient tolerated treatment well Patient left: in bed  OT Visit Diagnosis: Muscle weakness (generalized) (M62.81)                Time: 5621-3086 OT Time Calculation (min): 43 min Charges:  OT General Charges $OT Visit: 1 Visit OT Evaluation $OT Eval Moderate Complexity: 1 Mod OT Treatments $Self Care/Home Management : 8-22 mins  Reece Packer OT/L   Dashun Borre 09/20/2020, 2:27 PM

## 2020-09-21 DIAGNOSIS — R7989 Other specified abnormal findings of blood chemistry: Secondary | ICD-10-CM | POA: Diagnosis present

## 2020-09-21 DIAGNOSIS — I129 Hypertensive chronic kidney disease with stage 1 through stage 4 chronic kidney disease, or unspecified chronic kidney disease: Secondary | ICD-10-CM | POA: Diagnosis present

## 2020-09-21 DIAGNOSIS — F410 Panic disorder [episodic paroxysmal anxiety] without agoraphobia: Secondary | ICD-10-CM | POA: Diagnosis present

## 2020-09-21 DIAGNOSIS — K625 Hemorrhage of anus and rectum: Secondary | ICD-10-CM | POA: Diagnosis present

## 2020-09-21 DIAGNOSIS — Z20822 Contact with and (suspected) exposure to covid-19: Secondary | ICD-10-CM | POA: Diagnosis present

## 2020-09-21 DIAGNOSIS — Z6841 Body Mass Index (BMI) 40.0 and over, adult: Secondary | ICD-10-CM | POA: Diagnosis not present

## 2020-09-21 DIAGNOSIS — I447 Left bundle-branch block, unspecified: Secondary | ICD-10-CM | POA: Diagnosis present

## 2020-09-21 DIAGNOSIS — K5130 Ulcerative (chronic) rectosigmoiditis without complications: Secondary | ICD-10-CM | POA: Diagnosis present

## 2020-09-21 DIAGNOSIS — N83201 Unspecified ovarian cyst, right side: Secondary | ICD-10-CM | POA: Diagnosis present

## 2020-09-21 DIAGNOSIS — E1165 Type 2 diabetes mellitus with hyperglycemia: Secondary | ICD-10-CM | POA: Diagnosis present

## 2020-09-21 DIAGNOSIS — N83202 Unspecified ovarian cyst, left side: Secondary | ICD-10-CM | POA: Diagnosis present

## 2020-09-21 DIAGNOSIS — K922 Gastrointestinal hemorrhage, unspecified: Secondary | ICD-10-CM | POA: Diagnosis not present

## 2020-09-21 DIAGNOSIS — A045 Campylobacter enteritis: Secondary | ICD-10-CM | POA: Diagnosis not present

## 2020-09-21 DIAGNOSIS — F32A Depression, unspecified: Secondary | ICD-10-CM | POA: Diagnosis present

## 2020-09-21 DIAGNOSIS — R531 Weakness: Secondary | ICD-10-CM | POA: Diagnosis present

## 2020-09-21 DIAGNOSIS — I701 Atherosclerosis of renal artery: Secondary | ICD-10-CM | POA: Diagnosis present

## 2020-09-21 DIAGNOSIS — I480 Paroxysmal atrial fibrillation: Secondary | ICD-10-CM | POA: Diagnosis present

## 2020-09-21 DIAGNOSIS — R197 Diarrhea, unspecified: Secondary | ICD-10-CM | POA: Diagnosis not present

## 2020-09-21 DIAGNOSIS — B9689 Other specified bacterial agents as the cause of diseases classified elsewhere: Secondary | ICD-10-CM | POA: Diagnosis present

## 2020-09-21 DIAGNOSIS — N1831 Chronic kidney disease, stage 3a: Secondary | ICD-10-CM | POA: Diagnosis present

## 2020-09-21 DIAGNOSIS — E785 Hyperlipidemia, unspecified: Secondary | ICD-10-CM | POA: Diagnosis present

## 2020-09-21 DIAGNOSIS — Z9841 Cataract extraction status, right eye: Secondary | ICD-10-CM | POA: Diagnosis not present

## 2020-09-21 DIAGNOSIS — N179 Acute kidney failure, unspecified: Secondary | ICD-10-CM | POA: Diagnosis present

## 2020-09-21 DIAGNOSIS — E1122 Type 2 diabetes mellitus with diabetic chronic kidney disease: Secondary | ICD-10-CM | POA: Diagnosis present

## 2020-09-21 DIAGNOSIS — E1142 Type 2 diabetes mellitus with diabetic polyneuropathy: Secondary | ICD-10-CM | POA: Diagnosis present

## 2020-09-21 DIAGNOSIS — F419 Anxiety disorder, unspecified: Secondary | ICD-10-CM | POA: Diagnosis present

## 2020-09-21 DIAGNOSIS — I959 Hypotension, unspecified: Secondary | ICD-10-CM | POA: Diagnosis not present

## 2020-09-21 LAB — CBC
HCT: 39.5 % (ref 36.0–46.0)
Hemoglobin: 13 g/dL (ref 12.0–15.0)
MCH: 29.3 pg (ref 26.0–34.0)
MCHC: 32.9 g/dL (ref 30.0–36.0)
MCV: 89 fL (ref 80.0–100.0)
Platelets: 191 10*3/uL (ref 150–400)
RBC: 4.44 MIL/uL (ref 3.87–5.11)
RDW: 16 % — ABNORMAL HIGH (ref 11.5–15.5)
WBC: 4.4 10*3/uL (ref 4.0–10.5)
nRBC: 0 % (ref 0.0–0.2)

## 2020-09-21 LAB — RENAL FUNCTION PANEL
Albumin: 2.6 g/dL — ABNORMAL LOW (ref 3.5–5.0)
Anion gap: 5 (ref 5–15)
BUN: 8 mg/dL (ref 8–23)
CO2: 28 mmol/L (ref 22–32)
Calcium: 8.8 mg/dL — ABNORMAL LOW (ref 8.9–10.3)
Chloride: 107 mmol/L (ref 98–111)
Creatinine, Ser: 0.88 mg/dL (ref 0.44–1.00)
GFR, Estimated: >60 mL/min (ref >60)
Glucose, Bld: 123 mg/dL — ABNORMAL HIGH (ref 70–99)
Phosphorus: 3.2 mg/dL (ref 2.5–4.6)
Potassium: 3.7 mmol/L (ref 3.5–5.1)
Sodium: 140 mmol/L (ref 135–145)

## 2020-09-21 LAB — GLUCOSE, CAPILLARY
Glucose-Capillary: 157 mg/dL — ABNORMAL HIGH (ref 70–99)
Glucose-Capillary: 107 mg/dL — ABNORMAL HIGH (ref 70–99)
Glucose-Capillary: 148 mg/dL — ABNORMAL HIGH (ref 70–99)
Glucose-Capillary: 162 mg/dL — ABNORMAL HIGH (ref 70–99)

## 2020-09-21 LAB — MAGNESIUM: Magnesium: 1.7 mg/dL (ref 1.7–2.4)

## 2020-09-21 MED ORDER — POTASSIUM CHLORIDE IN NACL 20-0.9 MEQ/L-% IV SOLN
INTRAVENOUS | Status: AC
  Filled 2020-09-21: qty 1000

## 2020-09-21 MED ORDER — POTASSIUM CHLORIDE CRYS ER 20 MEQ PO TBCR
40.0000 meq | EXTENDED_RELEASE_TABLET | Freq: Once | ORAL | Status: AC
  Administered 2020-09-21: 40 meq via ORAL
  Filled 2020-09-21: qty 2

## 2020-09-21 MED ORDER — POTASSIUM CHLORIDE IN NACL 20-0.9 MEQ/L-% IV SOLN
INTRAVENOUS | Status: DC
  Filled 2020-09-21: qty 1000

## 2020-09-21 MED ORDER — MAGNESIUM SULFATE 2 GM/50ML IV SOLN
2.0000 g | Freq: Once | INTRAVENOUS | Status: AC
  Administered 2020-09-21: 2 g via INTRAVENOUS
  Filled 2020-09-21: qty 50

## 2020-09-21 NOTE — Progress Notes (Signed)
[facility] Gastroenterology Progress Note  Kristin [name] 65 y.o. 07-15-1955   Subjective: Stool frequency decreasing but stools remain very watery. Reports some blood with wiping but denies seeing in the toilet since admit. Tolerating soft diet.  Objective: Vital signs: Vitals:   09/21/20 1029 09/21/20 1618  BP: 125/72 (!) 126/57  Pulse: 71 73  Resp: 18 18  Temp: 97.7 F (36.5 C) 98.9 F (37.2 C)  SpO2: 96% 95%    Physical Exam: Gen: alert, no acute distress, obese, pleasant HEENT: anicteric sclera CV: RRR Chest: CTA B Abd: diffuse tenderness with guarding, soft, nondistended, +BS Ext: no edema  Lab Results: Recent Labs    09/20/20 0139 09/21/20 0230  NA 138 140  K 4.3 3.7  CL 106 107  CO2 25 28  GLUCOSE 113* 123*  BUN 16 8  CREATININE 0.88 0.88  CALCIUM 8.8* 8.8*  MG 1.7 1.7  PHOS 3.2 3.2   Recent Labs    09/19/20 2132 09/20/20 0139 09/21/20 0230  AST 20 18  --   ALT 23 21  --   ALKPHOS 51 47  --   BILITOT 1.1 1.2  --   PROT 6.0* 5.5*  --   ALBUMIN 3.3* 3.0* 2.6*   Recent Labs    09/19/20 2132 09/20/20 0139 09/20/20 1142 09/20/20 1659 09/21/20 0230  WBC 4.7 4.6  --   --  4.4  NEUTROABS 2.9  --   --   --   --   HGB 14.3 13.5   < > 12.9 13.0  HCT 44.5 41.7   < > 40.1 39.5  MCV 90.3 89.7  --   --  89.0  PLT 200 195  --   --  191   < > = values in this interval not displayed.      Assessment/Plan: Rectal bleeding resolving. Continue to manage conservatively. Hgb stable at 13. Diarrhea: Positive for Campylobacter and started on 3 days of Azithromycin. C. Diff negative. Continue supportive care. Patient needs to f/u with Dr. Collene Mares (her primary GI) as an outpt to decide on timing of a repeat colonoscopy. Will sign off. Call if questions.   Kristin [name] 09/21/2020, 6:55 PM  Questions please call [phone] Patient ID: Kristin [name], female   DOB: [birth year], 65 y.o.   MRN: [id number]

## 2020-09-21 NOTE — Plan of Care (Signed)
  Problem: Education: Goal: Knowledge of General Education information will improve Description Including pain rating scale, medication(s)/side effects and non-pharmacologic comfort measures Outcome: Progressing   

## 2020-09-21 NOTE — Plan of Care (Signed)
Stool negative for Cdiff, but as pt is still having diarrhea that is Campylobacter +, Enteric precautions maintained.  Using CPAP at HS without issues.  No issues noted on telemetry.  Ayesha Mohair BSN RN CMSRN    Problem: Clinical Measurements: Goal: Diagnostic test results will improve Outcome: Not Progressing

## 2020-09-21 NOTE — Progress Notes (Signed)
PROGRESS NOTE  Kristin [name] UXN:[id number] DOB: 05/02/1955   PCP: Harlan Stains, MD  Patient is from: Home.  Lives alone.  Ambulates independently at baseline.  DOA: 09/19/2020 LOS: 0  Chief complaints:  Chief Complaint  Patient presents with   GI Bleeding     Brief Narrative / Interim history: 65 year old F with history of DM-2, paroxysmal A. fib on Xarelto, HTN, diarrhea, anxiety and depression, remote history of rectovaginal fistula s/p colostomy and reversal in 1986, ovarian cyst, stool and urine incontinence, and morbid obesity presenting with acute rectal bleed that has started the day of presentation.  Patient has nausea, intermittent vomiting, diarrhea and fatigue for about 2 months as well.  Hemoccult was positive.  Hgb 14.3 (baseline 15.6).  CT abdomen and pelvis concerning for proctocolitis and 50% right renal artery stenosis.  On call GI consulted.   Patient is followed by Dr. Collene Mares at [place].  She says she had colonoscopy about 5 to 6 years ago that showed polyps which were resected.  Patient's GIP panel positive for campylobacter.  Started on azithromycin.   Subjective: Seen and examined earlier this morning.  Reports multiple loose bowel movements overnight and this morning.  She is not sure about melena or hematochezia.  She also reports nausea but no emesis.  She denies chest pain, dyspnea, palpitation or dizziness but feels weak.  Objective: Vitals:   09/20/20 1852 09/21/20 0500 09/21/20 0615 09/21/20 1029  BP: (!) 116/57  128/63 125/72  Pulse: 70  64 71  Resp: 18  18 18   Temp: 98.4 F (36.9 C)  98.2 F (36.8 C) 97.7 F (36.5 C)  TempSrc: Oral  Oral Oral  SpO2: 90%  94% 96%  Weight:  119.2 kg    Height:        Intake/Output Summary (Last 24 hours) at 09/21/2020 1456 Last data filed at 09/21/2020 0624 Gross per 24 hour  Intake 820 ml  Output 1300 ml  Net -480 ml   Filed Weights   09/20/20 0132 09/21/20 0500  Weight: 118 kg 119.2 kg     Examination:  GENERAL: No apparent distress.  Nontoxic. HEENT: MMM.  Vision and hearing grossly intact.  NECK: Supple.  No apparent JVD.  RESP: On RA.  No IWOB.  Fair aeration bilaterally. CVS:  RRR. Heart sounds normal.  ABD/GI/GU: BS+. Abd soft.  Some diffuse tenderness. MSK/EXT:  Moves extremities. No apparent deformity. No edema.  SKIN: no apparent skin lesion or wound NEURO: Awake and alert. Oriented appropriately.  No apparent focal neuro deficit. PSYCH: Calm. Normal affect.   Procedures:  None  Microbiology summarized: COVID-19 PCR nonreactive.  Assessment & Plan: Acute painless hematochezia: due to proctocolitis?  GI panel positive for compylobater.  Could be diverticular or hemorrhoidal.  Patient is on Xarelto for A. fib.  H&H relatively stable. Recent Labs    03/25/20 2144 06/14/20 1944 09/17/20 2258 09/19/20 2132 09/20/20 0139 09/20/20 1142 09/20/20 1659 09/21/20 0230  HGB 15.3* 15.0 15.6* 14.3 13.5 13.1 12.9 13.0  -Continue holding Xarelto pending formal recommendation from her GI in the morning -Recheck CBC in the morning -Check C. difficile and GIP given ongoing diarrhea   Acute on chronic diarrhea/nausea/vomiting: CT raising concern for possible proctocolitis. GI panel positive for compylobater.  C. difficile is negative.  She reports multiple bowel movements overnight.  Also with nausea but no emesis. -IV NS-KCl 20 mEq at 75 cc an hour -Continue PPI and as needed antiemetics   Generalized weakness fatigue  suspect multifactorial: Could be due to the above. -Treat treatable causes as above -PT/OT    AKI on CKD-3A/azotemia: Likely prerenal from GI loss.  Also on Avapro and Lasix at home which could contribute. Recent Labs    03/25/20 2144 06/14/20 1944 09/17/20 2258 09/19/20 2132 09/20/20 0139 09/21/20 0230  BUN 19 13 29* 19 16 8   CREATININE 0.88 0.67 1.09* 1.06* 0.88 0.88  -Continue IV fluid as above -Avoid or minimize nephrotoxic meds    Paroxysmal A. fib on Xarelto: Rate controlled.  On Coreg, Cardizem -Continue holding Coreg and Cardizem -Continue holding Xarelto in the setting of GI bleed pending GI recommendation  Hypotension: Improved. -Continue holding home Coreg, Cardizem, Avapro and Lasix. -Continue IV fluid-increase rate to 75   Chronic anxiety/depression: Stable. Patient stopped taking Cymbalta on the day of presentation. Plan was for her Wellbutrin dose to be doubled by her psychiatrist. -Resume home Wellbutrin, Ativan and Ritalin   History of rectovaginal fistula in 1986 s/p colostomy and reversal at [facility] GYN exam 2 to 3 years ago and there was no evidence of recurrent rectovaginal fistula.   Self-reported large ovarian cyst-CT confirmed bilateral simple appearing stable adnexal cysts -Outpatient follow-up with [facility].  Uncontrolled DM-2: On Jardiance at home.  A1c 7.5%. Recent Labs  Lab 09/20/20 0343 09/20/20 0847 09/20/20 1147 09/20/20 1623 09/20/20 2106  GLUCAP 120* 70 84 123* 107*  -Continue SSI and a started  Renal artery stenosis: 50% stenosis of right renal artery at its origin on CT abdomen and pelvis.  Incidental finding. -Needs outpatient follow-up with vascular surgery  Morbid obesity Body mass index is 45.11 kg/m. -Encourage lifestyle change to lose weight.     DVT prophylaxis:  SCDs Start: 09/19/20 2314  Code Status: Full code Family Communication: Patient and/or RN. Available if any question.  Level of care: Telemetry Medical Status is: Observation  The patient will require care spanning > 2 midnights and should be moved to inpatient because: Hemodynamically unstable, IV treatments appropriate due to intensity of illness or inability to take PO, and Inpatient level of care appropriate due to severity of illness  Dispo:  Patient From: Home  Planned Disposition: Home  Medically stable for discharge: No      Consultants:  Gastroenterology   Sch Meds:  Scheduled  Meds:  atorvastatin  20 mg Oral QHS   azithromycin  500 mg Oral Daily   buPROPion  300 mg Oral q morning   insulin aspart  0-5 Units Subcutaneous QHS   insulin aspart  0-9 Units Subcutaneous TID WC   insulin aspart  2 Units Subcutaneous TID WC   pantoprazole (PROTONIX) IV  40 mg Intravenous QHS   rOPINIRole  2 mg Oral QHS   Continuous Infusions:  0.9 % NaCl with KCl 20 mEq / L     PRN Meds:.acetaminophen, ALPRAZolam, melatonin, ondansetron (ZOFRAN) IV, rOPINIRole  Antimicrobials: Anti-infectives (From admission, onward)    Start     Dose/Rate Route Frequency Ordered Stop   09/20/20 1930  azithromycin (ZITHROMAX) tablet 500 mg        500 mg Oral Daily 09/20/20 1751 09/23/20 0959        I have personally reviewed the following labs and images: CBC: Recent Labs  Lab 09/17/20 2258 09/19/20 2132 09/20/20 0139 09/20/20 1142 09/20/20 1659 09/21/20 0230  WBC 11.0* 4.7 4.6  --   --  4.4  NEUTROABS 8.9* 2.9  --   --   --   --   HGB 15.6*  14.3 13.5 13.1 12.9 13.0  HCT 47.4* 44.5 41.7 40.5 40.1 39.5  MCV 88.8 90.3 89.7  --   --  89.0  PLT 247 200 195  --   --  191   BMP &GFR Recent Labs  Lab 09/17/20 2258 09/19/20 2132 09/20/20 0139 09/21/20 0230  NA 133* 135 138 140  K 3.9 3.6 4.3 3.7  CL 96* 101 106 107  CO2 23 24 25 28   GLUCOSE 180* 121* 113* 123*  BUN 29* 19 16 8   CREATININE 1.09* 1.06* 0.88 0.88  CALCIUM 9.3 9.1 8.8* 8.8*  MG  --   --  1.7 1.7  PHOS  --   --  3.2 3.2   Estimated Creatinine Clearance: 83.2 mL/min (by C-G formula based on SCr of 0.88 mg/dL). Liver & Pancreas: Recent Labs  Lab 09/17/20 2258 09/19/20 2132 09/20/20 0139 09/21/20 0230  AST 24 20 18   --   ALT 28 23 21   --   ALKPHOS 66 51 47  --   BILITOT 0.9 1.1 1.2  --   PROT 7.0 6.0* 5.5*  --   ALBUMIN 3.8 3.3* 3.0* 2.6*   Recent Labs  Lab 09/19/20 2132  LIPASE 39   No results for input(s): AMMONIA in the last 168 hours. Diabetic: Recent Labs    09/20/20 0139  HGBA1C 7.5*    Recent Labs  Lab 09/20/20 0343 09/20/20 0847 09/20/20 1147 09/20/20 1623 09/20/20 2106  GLUCAP 120* 84 84 123* 107*   Cardiac Enzymes: No results for input(s): CKTOTAL, CKMB, CKMBINDEX, TROPONINI in the last 168 hours. No results for input(s): PROBNP in the last 8760 hours. Coagulation Profile: Recent Labs  Lab 09/19/20 2132  INR 2.4*   Thyroid Function Tests: No results for input(s): TSH, T4TOTAL, FREET4, T3FREE, THYROIDAB in the last 72 hours.  Lipid Profile: No results for input(s): CHOL, HDL, LDLCALC, TRIG, CHOLHDL, LDLDIRECT in the last 72 hours. Anemia Panel: No results for input(s): VITAMINB12, FOLATE, FERRITIN, TIBC, IRON, RETICCTPCT in the last 72 hours. Urine analysis:    Component Value Date/Time   COLORURINE YELLOW 09/17/2020 2337   APPEARANCEUR CLEAR 09/17/2020 2337   LABSPEC 1.015 09/17/2020 2337   PHURINE 5.5 09/17/2020 2337   GLUCOSEU >=500 (A) 09/17/2020 2337   HGBUR MODERATE (A) 09/17/2020 2337   BILIRUBINUR MODERATE (A) 09/17/2020 2337   KETONESUR 15 (A) 09/17/2020 2337   PROTEINUR NEGATIVE 09/17/2020 2337   UROBILINOGEN 0.2 01/25/2014 1943   NITRITE NEGATIVE 09/17/2020 2337   LEUKOCYTESUR NEGATIVE 09/17/2020 2337   Sepsis Labs: Invalid input(s): PROCALCITONIN, [place]  Microbiology: Recent Results (from the past 240 hour(s))  Resp Panel by RT-PCR (Flu A&B, Covid) Nasopharyngeal Swab     Status: None   Collection Time: 09/17/20 11:37 PM   Specimen: Nasopharyngeal Swab; Nasopharyngeal(NP) swabs in vial transport medium  Result Value Ref Range Status   SARS Coronavirus 2 by RT PCR NEGATIVE NEGATIVE Final    Comment: (NOTE) SARS-CoV-2 target nucleic acids are NOT DETECTED.  The SARS-CoV-2 RNA is generally detectable in upper respiratory specimens during the acute phase of infection. The lowest concentration of SARS-CoV-2 viral copies this assay can detect is 138 copies/mL. A negative result does not preclude SARS-Cov-2 infection and  should not be used as the sole basis for treatment or other patient management decisions. A negative result may occur with  improper specimen collection/handling, submission of specimen other than nasopharyngeal swab, presence of viral mutation(s) within the areas targeted by this assay, and inadequate number of  viral copies(<138 copies/mL). A negative result must be combined with clinical observations, patient history, and epidemiological information. The expected result is Negative.  Fact Sheet for Patients:  EntrepreneurPulse.com.au  Fact Sheet for Healthcare Providers:  IncredibleEmployment.be  This test is no t yet approved or cleared by the [place] FDA and  has been authorized for detection and/or diagnosis of SARS-CoV-2 by FDA under an Emergency Use Authorization (EUA). This EUA will remain  in effect (meaning this test can be used) for the duration of the COVID-19 declaration under Section 564(b)(1) of the Act, 21 U.S.C.section 360bbb-3(b)(1), unless the authorization is terminated  or revoked sooner.       Influenza A by PCR NEGATIVE NEGATIVE Final   Influenza B by PCR NEGATIVE NEGATIVE Final    Comment: (NOTE) The Xpert Xpress SARS-CoV-2/FLU/RSV plus assay is intended as an aid in the diagnosis of influenza from Nasopharyngeal swab specimens and should not be used as a sole basis for treatment. Nasal washings and aspirates are unacceptable for Xpert Xpress SARS-CoV-2/FLU/RSV testing.  Fact Sheet for Patients: EntrepreneurPulse.com.au  Fact Sheet for Healthcare Providers: IncredibleEmployment.be  This test is not yet approved or cleared by the [place] FDA and has been authorized for detection and/or diagnosis of SARS-CoV-2 by FDA under an Emergency Use Authorization (EUA). This EUA will remain in effect (meaning this test can be used) for the duration of the COVID-19 declaration  under Section 564(b)(1) of the Act, 21 U.S.C. section 360bbb-3(b)(1), unless the authorization is terminated or revoked.  Performed at [facility], [address] [place], [place] [address]   SARS CORONAVIRUS 2 (TAT 6-24 HRS) Nasopharyngeal Nasopharyngeal Swab     Status: None   Collection Time: 09/20/20 12:15 AM   Specimen: Nasopharyngeal Swab  Result Value Ref Range Status   SARS Coronavirus 2 NEGATIVE NEGATIVE Final    Comment: (NOTE) SARS-CoV-2 target nucleic acids are NOT DETECTED.  The SARS-CoV-2 RNA is generally detectable in upper and lower respiratory specimens during the acute phase of infection. Negative results do not preclude SARS-CoV-2 infection, do not rule out co-infections with other pathogens, and should not be used as the sole basis for treatment or other patient management decisions. Negative results must be combined with clinical observations, patient history, and epidemiological information. The expected result is Negative.  Fact Sheet for Patients: SugarRoll.be  Fact Sheet for Healthcare Providers: https://www.woods-mathews.com/  This test is not yet approved or cleared by the [place] FDA and  has been authorized for detection and/or diagnosis of SARS-CoV-2 by FDA under an Emergency Use Authorization (EUA). This EUA will remain  in effect (meaning this test can be used) for the duration of the COVID-19 declaration under Se ction 564(b)(1) of the Act, 21 U.S.C. section 360bbb-3(b)(1), unless the authorization is terminated or revoked sooner.  Performed at [facility], [place] [address] [place], [place] [address]   Gastrointestinal Panel by PCR , Stool     Status: Abnormal   Collection Time: 09/20/20  3:10 AM   Specimen: Stool  Result Value Ref Range Status   Campylobacter species DETECTED (A) NOT DETECTED Final    Comment: RESULT CALLED TO, READ BACK BY AND VERIFIED WITH: NICHOLA  GAUNTLETT AT 7035 09/20/20.PMF    Plesimonas shigelloides NOT DETECTED NOT DETECTED Final   Salmonella species NOT DETECTED NOT DETECTED Final   Yersinia enterocolitica NOT DETECTED NOT DETECTED Final   Vibrio species NOT DETECTED NOT DETECTED Final   Vibrio cholerae NOT DETECTED  NOT DETECTED Final   Enteroaggregative E coli (EAEC) NOT DETECTED NOT DETECTED Final   Enteropathogenic E coli (EPEC) NOT DETECTED NOT DETECTED Final   Enterotoxigenic E coli (ETEC) NOT DETECTED NOT DETECTED Final   Shiga like toxin producing E coli (STEC) NOT DETECTED NOT DETECTED Final   Shigella/Enteroinvasive E coli (EIEC) NOT DETECTED NOT DETECTED Final   Cryptosporidium NOT DETECTED NOT DETECTED Final   Cyclospora cayetanensis NOT DETECTED NOT DETECTED Final   Entamoeba histolytica NOT DETECTED NOT DETECTED Final   Giardia lamblia NOT DETECTED NOT DETECTED Final   Adenovirus F40/41 NOT DETECTED NOT DETECTED Final   Astrovirus NOT DETECTED NOT DETECTED Final   Norovirus GI/GII NOT DETECTED NOT DETECTED Final   Rotavirus A NOT DETECTED NOT DETECTED Final   Sapovirus (I, II, IV, and V) NOT DETECTED NOT DETECTED Final    Comment: Performed at [facility], [address] [place], [place] [address]  C Difficile Quick Screen w PCR reflex     Status: None   Collection Time: 09/20/20 11:36 AM   Specimen: STOOL  Result Value Ref Range Status   C Diff antigen NEGATIVE NEGATIVE Final   C Diff toxin NEGATIVE NEGATIVE Final   C Diff interpretation No C. difficile detected.  Final    Comment: Performed at [facility], [place] [address] [place], [place] [address]    Radiology Studies: No results found.    Marjorie Deprey T. Patterson  If 7PM-7AM, please contact night-coverage [email or web address] 09/21/2020, 2:56 PM

## 2020-09-22 DIAGNOSIS — I10 Essential (primary) hypertension: Secondary | ICD-10-CM

## 2020-09-22 DIAGNOSIS — N179 Acute kidney failure, unspecified: Secondary | ICD-10-CM

## 2020-09-22 DIAGNOSIS — F410 Panic disorder [episodic paroxysmal anxiety] without agoraphobia: Secondary | ICD-10-CM

## 2020-09-22 DIAGNOSIS — R197 Diarrhea, unspecified: Secondary | ICD-10-CM

## 2020-09-22 LAB — RENAL FUNCTION PANEL
Albumin: 2.8 g/dL — ABNORMAL LOW (ref 3.5–5.0)
Anion gap: 8 (ref 5–15)
BUN: 9 mg/dL (ref 8–23)
CO2: 25 mmol/L (ref 22–32)
Calcium: 8.7 mg/dL — ABNORMAL LOW (ref 8.9–10.3)
Chloride: 105 mmol/L (ref 98–111)
Creatinine, Ser: 1 mg/dL (ref 0.44–1.00)
GFR, Estimated: >60 mL/min (ref >60)
Glucose, Bld: 158 mg/dL — ABNORMAL HIGH (ref 70–99)
Phosphorus: 2.2 mg/dL — ABNORMAL LOW (ref 2.5–4.6)
Potassium: 3.4 mmol/L — ABNORMAL LOW (ref 3.5–5.1)
Sodium: 138 mmol/L (ref 135–145)

## 2020-09-22 LAB — CBC
HCT: 41.3 % (ref 36.0–46.0)
Hemoglobin: 13.1 g/dL (ref 12.0–15.0)
MCH: 28.5 pg (ref 26.0–34.0)
MCHC: 31.7 g/dL (ref 30.0–36.0)
MCV: 89.8 fL (ref 80.0–100.0)
Platelets: 228 10*3/uL (ref 150–400)
RBC: 4.6 MIL/uL (ref 3.87–5.11)
RDW: 16.2 % — ABNORMAL HIGH (ref 11.5–15.5)
WBC: 5.7 10*3/uL (ref 4.0–10.5)
nRBC: 0 % (ref 0.0–0.2)

## 2020-09-22 LAB — MAGNESIUM: Magnesium: 1.7 mg/dL (ref 1.7–2.4)

## 2020-09-22 LAB — GLUCOSE, CAPILLARY
Glucose-Capillary: 155 mg/dL — ABNORMAL HIGH (ref 70–99)
Glucose-Capillary: 132 mg/dL — ABNORMAL HIGH (ref 70–99)

## 2020-09-22 MED ORDER — ONDANSETRON HCL 4 MG PO TABS: 4.0000 mg | ORAL_TABLET | Freq: Every day | ORAL | 0 refills | Status: DC | PRN

## 2020-09-22 MED ORDER — IRBESARTAN 150 MG PO TABS: 150.0000 mg | ORAL_TABLET | Freq: Every morning | ORAL | Status: DC

## 2020-09-22 MED ORDER — MAGNESIUM SULFATE 2 GM/50ML IV SOLN
2.0000 g | Freq: Once | INTRAVENOUS | Status: AC
  Administered 2020-09-22: 2 g via INTRAVENOUS
  Filled 2020-09-22: qty 50

## 2020-09-22 MED ORDER — FUROSEMIDE 20 MG PO TABS: 20.0000 mg | ORAL_TABLET | Freq: Every morning | ORAL | Status: AC

## 2020-09-22 MED ORDER — POTASSIUM CHLORIDE CRYS ER 20 MEQ PO TBCR
40.0000 meq | EXTENDED_RELEASE_TABLET | ORAL | Status: AC
  Administered 2020-09-22 (×2): 40 meq via ORAL
  Filled 2020-09-22 (×2): qty 2

## 2020-09-22 NOTE — Progress Notes (Signed)
   09/22/20 1010  Clinical Encounter Type  Visited With Patient  Visit Type Spiritual support  Referral From Nurse;Chaplain  Consult/Referral To Chaplain  Spiritual Encounters  Spiritual Needs Emotional  Stress Factors  Patient Stress Factors Exhausted;Major life changes;Health changes  Chaplain responded to the referral from the nurse.  Ms. Labarbera has anxiety and is sick in her body.  Her mother Mrs. Hoyle Sauer is in 40M10.  Her mother has dementia, and she is the soul care giver and is feeling guilty she cannot take care of her mother at the time and expressed it gets heavy to be the caretaker.  We talked about some suggestions and reaching out to her pastor and church family for support.  She was very teary, and her heart is so heavy, but she expressed talking and hearing the suggestions has helped.  I explained taking care of herself and getting stronger was the main goal and the Med Team here on 40M will take loving care of her mother and she can speak with the doctor and the nurse about her mother's progress.    Chaplain Charina Fons Morgan-Simpson  [phone]

## 2020-09-22 NOTE — Progress Notes (Signed)
DISCHARGE NOTE HOME SHTERNA LARAMEE to be discharged Home per MD order. Discussed prescriptions and follow up appointments with the patient. Prescriptions given to patient; medication list explained in detail. Patient verbalized understanding.  Skin clean, dry and intact without evidence of skin break down, no evidence of skin tears noted. IV catheter discontinued intact. Site without signs and symptoms of complications. Dressing and pressure applied. Pt denies pain at the site currently. No complaints noted.  Patient free of lines, drains, and wounds.   An After Visit Summary (AVS) was printed and given to the patient. Patient escorted via wheelchair, and discharged home via private auto.  Orville Govern, RN

## 2020-09-22 NOTE — Discharge Summary (Signed)
Physician Discharge Summary  Kristin [name] DZH:[id number] DOB: Aug 24, 1955 DOA: 09/19/2020  PCP: Harlan Stains, MD  Admit date: 09/19/2020 Discharge date: 09/22/2020  Admitted From: Home Disposition: Home  Recommendations for Outpatient Follow-up:  Follow ups as below. Please obtain CBC/BMP/Mag at follow up Please follow up on the following pending results: None  Home Health: None required Equipment/Devices: None required  Discharge Condition: Stable CODE STATUS: Full code   Follow-up Information     Harlan Stains, MD. Schedule an appointment as soon as possible for a visit in 1 week(s).   Specialty: Family Medicine Contact information: [address], [place] [address] [phone]         Juanita Craver, MD. Schedule an appointment as soon as possible for a visit in 1 week(s).   Specialty: Gastroenterology Why: for diarrhea and rectal bleed Contact information: [address], [place] [address] [phone]                Hospital Course: 65 year old F with history of DM-2, paroxysmal A. fib on Xarelto, HTN, diarrhea, anxiety and depression, remote history of rectovaginal fistula s/p colostomy and reversal in 1986, ovarian cyst, stool and urine incontinence, and morbid obesity presenting with acute rectal bleed that has started the day of presentation.  Patient has nausea, intermittent vomiting, diarrhea and fatigue for about 2 months as well.  Hemoccult was positive.  Hgb 14.3 (baseline 15.6).  CT abdomen and pelvis concerning for proctocolitis and 50% right renal artery stenosis.  On call GI consulted.  Patient's GIP panel positive for campylobacter.  C. difficile negative.  Completed 3 days of azithromycin.  Diarrhea improved.  H&H remained stable.  Cleared for discharge by gastroenterology for outpatient follow-up.  Contacted patient's gastroenterologist, Dr. Collene Mares via secure chat.  Dr. Collene Mares to arrange outpatient follow-up  in 1 week.  Patient has been advised to hold Avapro and Lasix until follow-up with PCP with GI.  Evaluated by therapy and no needles identified.  See individual problem list below for more on hospital course Discharge Diagnoses:  Acute painless hematochezia: due to proctocolitis?  GI panel positive for Campylobacter.  Could be diverticular or hemorrhoidal.  Patient is on Xarelto for A. fib.  H&H relatively stable. Recent Labs    03/25/20 2144 06/14/20 1944 09/17/20 2258 09/19/20 2132 09/20/20 0139 09/20/20 1142 09/20/20 1659 09/21/20 0230 09/22/20 0045  HGB 15.3* 15.0 15.6* 14.3 13.5 13.1 12.9 13.0 13.1  -Treated for Campylobacter as below -Cleared for discharge by GI -Resumed Xarelto on discharge -Patient's gastroenterologist to arrange outpatient follow-up in 1 week  Proctocolitis/Campylobacter infection Nausea/vomiting/diarrhea-no further emesis.  Diarrhea improved.  Still with some nausea but tolerating diet. -Completed 3 days of azithromycin.   -P.o. Zofran as needed  Panic attack/anxiety/depression: Panic attack overnight.  A lot of stressors including personal illness and her mother in the hospital with cancer.  Also difficulty tolerating CPAP. Patient stopped taking Cymbalta on the day of presentation. Plan was for her Wellbutrin dose to be doubled by her psychiatrist. -Counseled on bag breathing and relaxation techniques -Resume home Wellbutrin, Ativan and Ritalin -Encouraged to reach out to his psychiatrist   AKI on CKD-3A/azotemia: Likely prerenal from GI loss.  Also on Avapro and Lasix Recent Labs    03/25/20 2144 06/14/20 1944 09/17/20 2258 09/19/20 2132 09/20/20 0139 09/21/20 0230 09/22/20 0045  BUN 19 13 29* 19 16 8 9   CREATININE 0.88 0.67 1.09* 1.06* 0.88 0.88 1.00  -Advised to hold Avapro  and Lasix -Recheck renal function at follow-up   Paroxysmal A. fib on Xarelto: Rate controlled.  On Coreg, Cardizem -Continue home Coreg, Cardizem and Xarelto    Hypotension: Resolved. -Resume Coreg and Cardizem -Continue holding Lasix and Avapro   Generalized weakness fatigue suspect multifactorial: No need identified by therapy.   History of rectovaginal fistula in 1986 s/p colostomy and reversal at [facility] GYN exam 2 to 3 years ago and there was no evidence of recurrent rectovaginal fistula.   Self-reported large ovarian cyst-CT confirmed bilateral simple appearing stable adnexal cysts -Outpatient follow-up with Gyn   Uncontrolled DM-2: On Jardiance at home.  A1c 7.5%. -Continue home Jardiance  Renal artery stenosis: 50% stenosis of right renal artery at its origin on CT abdomen and pelvis.  Incidental finding. -Needs outpatient follow-up with vascular surgery at some point in the future   Morbid obesity Body mass index is 46.17 kg/m.  -Encourage lifestyle change to lose weight.          Discharge Exam: Vitals:   09/22/20 0344 09/22/20 1036  BP: (!) 123/55 135/73  Pulse: 65 70  Resp: 18 18  Temp: 98 F (36.7 C) 98.6 F (37 C)  SpO2: 98% 92%    GENERAL: No apparent distress.  Nontoxic. HEENT: MMM.  Vision and hearing grossly intact.  NECK: Supple.  No apparent JVD.  RESP: On RA.  No IWOB.  Fair aeration bilaterally. CVS:  RRR. Heart sounds normal.  ABD/GI/GU: Bowel sounds present. Soft. Non tender.  MSK/EXT:  Moves extremities. No apparent deformity. No edema.  SKIN: no apparent skin lesion or wound NEURO: Awake, alert and oriented appropriately.  No apparent focal neuro deficit. PSYCH: Calm. Normal affect.   Discharge Instructions  Discharge Instructions     Call MD for:  difficulty breathing, headache or visual disturbances   Complete by: As directed    Call MD for:  extreme fatigue   Complete by: As directed    Call MD for:  persistant dizziness or light-headedness   Complete by: As directed    Call MD for:  persistant nausea and vomiting   Complete by: As directed    Call MD for:  severe uncontrolled  pain   Complete by: As directed    Call MD for:  temperature >100.4   Complete by: As directed    Diet - low sodium heart healthy   Complete by: As directed    Diet Carb Modified   Complete by: As directed    Discharge instructions   Complete by: As directed    It has been a pleasure taking care of you!  You were hospitalized due to rectal bleed and acute on chronic diarrhea.  The bleeding seems to have subsided.  Your hemoglobin remained stable.  Your stool test showed Campylobacter infection.  You have been treated with antibiotic for this.  We anticipate the diarrhea to improve.  Meanwhile, we recommend holding your Avapro and Lasix for about a week and a starting 1 at a time.  Please review your new medication list and the directions on your medications before you take them.  Please follow-up with your primary care doctor and gastroenterologist in 1 to 2 weeks or sooner if needed.     Take care,   Increase activity slowly   Complete by: As directed       Allergies as of 09/22/2020       Reactions   Clinoril [sulindac] Other (See Comments)   Yeast infection/itching  Dilaudid [hydromorphone Hcl] Other (See Comments)   Changed respirations   Keflex [cephalexin] Other (See Comments)   Yeast infection/itching   Metformin Hcl Diarrhea   Sulfa Antibiotics Other (See Comments)   Yeast infection/itching   Tramadol Itching   itching        Medication List     STOP taking these medications    chlorpheniramine 4 MG tablet Commonly known as: CHLOR-TRIMETON       TAKE these medications    acetaminophen 500 MG tablet Commonly known as: TYLENOL Take 1,000 mg by mouth daily as needed for headache.   ALPRAZolam 0.25 MG tablet Commonly known as: XANAX Take 0.25 mg by mouth daily as needed (afib/panic attacks).   atorvastatin 20 MG tablet Commonly known as: LIPITOR Take 20 mg by mouth at bedtime.   Blink Tears 0.25 % Soln Generic drug: Polyethylene Glycol 400 Place  1 drop into both eyes daily as needed (dry eyes).   buPROPion 150 MG 24 hr tablet Commonly known as: WELLBUTRIN XL Take 300 mg by mouth every morning.   carvedilol 25 MG tablet Commonly known as: COREG Take 25 mg by mouth 2 (two) times daily with a meal.   cetirizine 10 MG tablet Commonly known as: ZYRTEC Take 10 mg by mouth daily as needed for allergies.   clotrimazole-betamethasone cream Commonly known as: LOTRISONE Apply 1 application topically daily as needed (rash.).   diclofenac Sodium 1 % Gel Commonly known as: VOLTAREN Apply 2-4 g topically daily as needed (pain).   empagliflozin 10 MG Tabs tablet Commonly known as: JARDIANCE Take 10 mg by mouth every morning.   furosemide 20 MG tablet Commonly known as: LASIX Take 1 tablet (20 mg total) by mouth every morning. Start taking on: September 29, 2020 What changed: These instructions start on September 29, 2020. If you are unsure what to do until then, ask your doctor or other care provider.   irbesartan 150 MG tablet Commonly known as: AVAPRO Take 1 tablet (150 mg total) by mouth every morning. Start taking on: September 29, 2020 What changed: These instructions start on September 29, 2020. If you are unsure what to do until then, ask your doctor or other care provider.   methylphenidate 10 MG tablet Commonly known as: RITALIN Take 10 mg by mouth as needed (narcoleptic episodes when driving long distances.).   metroNIDAZOLE 0.75 % cream Commonly known as: METROCREAM Apply 1 application topically daily as needed (rosacea).   omeprazole 20 MG tablet Commonly known as: PRILOSEC OTC Take 20 mg by mouth at bedtime.   ondansetron 4 MG tablet Commonly known as: Zofran Take 1 tablet (4 mg total) by mouth daily as needed for up to 20 doses for nausea or vomiting.   rOPINIRole 2 MG tablet Commonly known as: REQUIP Take 1-2 mg by mouth See admin instructions. Take 1/2 tablet (1 mg) by mouth with supper as needed for restless legs, take 1  tablet (2 mg) daily at bedtime   Vitamin D-3 125 MCG (5000 UT) Tabs Take 5,000 Units by mouth at bedtime.       ASK your doctor about these medications    diltiazem 120 MG 24 hr capsule Commonly known as: CARDIZEM CD Take 1 capsule (120 mg total) by mouth daily.   diltiazem 30 MG tablet Commonly known as: Cardizem Take 1 Tablet Every 4 Hours As Needed For HR >100. Please keep upcoming appt in May 2022 before anymore refills. Thank you   DULoxetine 30 MG capsule  Commonly known as: CYMBALTA Take 30 mg by mouth daily.   Xarelto 20 MG Tabs tablet Generic drug: rivaroxaban TAKE 1 TABLET BY MOUTH DAILY WITH SUPPER        Consultations: Gastroenterology  Procedures/Studies:  DG Chest 2 View  Result Date: 09/17/2020 CLINICAL DATA:  Shortness of breath EXAM: CHEST - 2 VIEW COMPARISON:  06/14/2020 FINDINGS: The heart size and mediastinal contours are within normal limits. Both lungs are clear. The visualized skeletal structures are unremarkable. IMPRESSION: No active cardiopulmonary disease. Electronically Signed   By: Donavan Foil M.D.   On: 09/17/2020 23:26   ECHOCARDIOGRAM COMPLETE  Result Date: 09/19/2020    ECHOCARDIOGRAM REPORT   Patient Name:   Kristin [name] Date of Exam: 09/19/2020 Medical Rec #:  [id number]       Height:       63.0 in Accession #:    [id number]      Weight:       277.8 lb Date of Birth:  1955/12/02        BSA:          2.223 m Patient Age:    69 years        BP:           146/79 mmHg Patient Gender: F               HR:           74 bpm. Exam Location:  [facility] Procedure: 2D Echo and 3D Echo Indications:    Atrial Fibrillation  History:        Patient has prior history of Echocardiogram examinations, most                 recent 04/26/2018. Arrythmias:LBBB; Risk Factors:Hypertension,                 Dyslipidemia and Diabetes.  Sonographer:    Mikki Santee RDCS Referring Phys: [place]  1. Abnormal septal motion . Left  ventricular ejection fraction, by estimation, is 55%. The left ventricle has normal function. The left ventricle has no regional wall motion abnormalities. Left ventricular diastolic parameters were normal.  2. Right ventricular systolic function is normal. The right ventricular size is normal.  3. The mitral valve is normal in structure. Trivial mitral valve regurgitation. No evidence of mitral stenosis.  4. The aortic valve is tricuspid. Aortic valve regurgitation is not visualized. No aortic stenosis is present.  5. The inferior vena cava is normal in size with greater than 50% respiratory variability, suggesting right atrial pressure of 3 mmHg. FINDINGS  Left Ventricle: Abnormal septal motion. Left ventricular ejection fraction, by estimation, is 55%. The left ventricle has normal function. The left ventricle has no regional wall motion abnormalities. The left ventricular internal cavity size was normal  in size. There is no left ventricular hypertrophy. Left ventricular diastolic parameters were normal. Right Ventricle: The right ventricular size is normal. No increase in right ventricular wall thickness. Right ventricular systolic function is normal. Left Atrium: Left atrial size was normal in size. Right Atrium: Right atrial size was normal in size. Pericardium: There is no evidence of pericardial effusion. Mitral Valve: The mitral valve is normal in structure. Trivial mitral valve regurgitation. No evidence of mitral valve stenosis. Tricuspid Valve: The tricuspid valve is normal in structure. Tricuspid valve regurgitation is trivial. No evidence of tricuspid stenosis. Aortic Valve: The aortic valve is tricuspid. Aortic valve regurgitation is not visualized. No aortic  stenosis is present. Pulmonic Valve: The pulmonic valve was normal in structure. Pulmonic valve regurgitation is not visualized. No evidence of pulmonic stenosis. Aorta: The aortic root is normal in size and structure. Venous: The inferior vena  cava is normal in size with greater than 50% respiratory variability, suggesting right atrial pressure of 3 mmHg. IAS/Shunts: No atrial level shunt detected by color flow Doppler.  LEFT VENTRICLE PLAX 2D LVIDd:         5.10 cm  Diastology LVIDs:         3.20 cm  LV e' medial:    6.81 cm/s LV PW:         1.10 cm  LV E/e' medial:  10.0 LV IVS:        1.10 cm  LV e' lateral:   6.00 cm/s LVOT diam:     2.10 cm  LV E/e' lateral: 11.3 LV SV:         81 LV SV Index:   36 LVOT Area:     3.46 cm  RIGHT VENTRICLE RV S prime:     13.30 cm/s TAPSE (M-mode): 2.8 cm LEFT ATRIUM             Index       RIGHT ATRIUM           Index LA diam:        3.40 cm 1.53 cm/m  RA Area:     13.00 cm LA Vol (A2C):   55.0 ml 24.74 ml/m RA Volume:   27.20 ml  12.23 ml/m LA Vol (A4C):   67.3 ml 30.27 ml/m LA Biplane Vol: 63.6 ml 28.61 ml/m  AORTIC VALVE LVOT Vmax:   117.00 cm/s LVOT Vmean:  75.000 cm/s LVOT VTI:    0.233 m  AORTA Ao Root diam: 3.10 cm MITRAL VALVE MV Area (PHT): 3.37 cm    SHUNTS MV Decel Time: 225 msec    Systemic VTI:  0.23 m MV E velocity: 67.90 cm/s  Systemic Diam: 2.10 cm MV A velocity: 72.90 cm/s MV E/A ratio:  0.93 Jenkins Rouge MD Electronically signed by Jenkins Rouge MD Signature Date/Time: 09/19/2020/1:55:11 PM    Final    CT Angio Abd/Pel w/ and/or w/o  Result Date: 09/20/2020 CLINICAL DATA:  Gastrointestinal hemorrhage, bright red blood per rectum, chronic anticoagulation EXAM: CTA ABDOMEN AND PELVIS WITHOUT AND WITH CONTRAST TECHNIQUE: Multidetector CT imaging of the abdomen and pelvis was performed using the standard protocol during bolus administration of intravenous contrast. Multiplanar reconstructed images and MIPs were obtained and reviewed to evaluate the vascular anatomy. CONTRAST:  138m OMNIPAQUE IOHEXOL 350 MG/ML SOLN COMPARISON:  CT abdomen pelvis 01/25/2014 FINDINGS: VASCULAR Aorta: Normal caliber. No aneurysm or dissection. No evidence of hemodynamically significant stenosis. Mild  atherosclerotic calcification. No periaortic inflammatory change. Celiac: Classic anatomic configuration. Widely patent. No aneurysm or dissection. SMA: Widely patent.  No aneurysm or dissection. Renals: Single renal arteries bilaterally. 50% stenosis of the right renal artery at its origin secondary to calcified atherosclerotic plaque. Left renal artery is widely patent. Normal vascular morphology. No aneurysm. IMA: Widely patent. Inflow: Widely patent. No evidence of hemodynamically significant stenosis, aneurysm, or dissection. Internal iliac arteries are patent bilaterally. Proximal Outflow: Widely patent. Veins: Normal anatomic configuration.  Widely patent. Review of the MIP images confirms the above findings. NON-VASCULAR Lower chest: The visualized lung bases are clear bilaterally. Visualized heart and pericardium are unremarkable. Hepatobiliary: No focal liver abnormality is seen. Status post cholecystectomy. No biliary dilatation. Pancreas:  Unremarkable Spleen: Unremarkable Adrenals/Urinary Tract: The adrenal glands are unremarkable. The kidneys are normal in size and position. 8 mm nonobstructing calculus is seen within the lower pole of the right kidney. No hydronephrosis. No ureteral calculi. The bladder is unremarkable. Stomach/Bowel: There is relative hyperemia involving the descending and rectosigmoid colon with mild pericolonic inflammatory stranding involving the descending: In keeping with changes of an underlying infectious or inflammatory colitis. Given the involvement of the rectum in wide patency of the a arterial and venous vascular structures centrally, ischemia is considered less likely. A superimposed spigelian hernia is seen within the left lower quadrant the abdomen containing a single loop of proximal sigmoid colon. There is no evidence of active extravasation identified. The stomach, small bowel, and large bowel are otherwise unremarkable. The appendix is unremarkable. No free  intraperitoneal gas or fluid. Lymphatic: No pathologic adenopathy within the abdomen and pelvis. Reproductive: Bilateral simple adnexal cysts are identified measuring up to 5.4 cm on the left and 4.6 cm on the right, unchanged from prior examination. The uterus is unremarkable. Other: None significant Musculoskeletal: No acute bone abnormality. No lytic or blastic bone lesion identified. IMPRESSION: VASCULAR 50% stenosis of the right renal artery at its origin. NON-VASCULAR No active extravasation identified involving the gastrointestinal tract. There is relative hyperemia involving the descending and rectosigmoid colon with superimposed pericolonic inflammatory stranding in keeping with a distal infectious or inflammatory proctocolitis. No evidence of obstruction. Superimposed spigelian hernia containing a single loop of proximal small bowel. This appears unchanged from prior examination. No evidence of obstruction. Mild right nonobstructing nephrolithiasis.  No urolithiasis. Bilateral simple appearing adnexal cyst, stable since remote prior examination and safely considered benign. Aortic Atherosclerosis (ICD10-I70.0). Electronically Signed   By: Fidela Salisbury MD   On: 09/20/2020 03:53       The results of significant diagnostics from this hospitalization (including imaging, microbiology, ancillary and laboratory) are listed below for reference.     Microbiology: Recent Results (from the past 240 hour(s))  Resp Panel by RT-PCR (Flu A&B, Covid) Nasopharyngeal Swab     Status: None   Collection Time: 09/17/20 11:37 PM   Specimen: Nasopharyngeal Swab; Nasopharyngeal(NP) swabs in vial transport medium  Result Value Ref Range Status   SARS Coronavirus 2 by RT PCR NEGATIVE NEGATIVE Final    Comment: (NOTE) SARS-CoV-2 target nucleic acids are NOT DETECTED.  The SARS-CoV-2 RNA is generally detectable in upper respiratory specimens during the acute phase of infection. The lowest concentration of  SARS-CoV-2 viral copies this assay can detect is 138 copies/mL. A negative result does not preclude SARS-Cov-2 infection and should not be used as the sole basis for treatment or other patient management decisions. A negative result may occur with  improper specimen collection/handling, submission of specimen other than nasopharyngeal swab, presence of viral mutation(s) within the areas targeted by this assay, and inadequate number of viral copies(<138 copies/mL). A negative result must be combined with clinical observations, patient history, and epidemiological information. The expected result is Negative.  Fact Sheet for Patients:  EntrepreneurPulse.com.au  Fact Sheet for Healthcare Providers:  IncredibleEmployment.be  This test is no t yet approved or cleared by the [place] FDA and  has been authorized for detection and/or diagnosis of SARS-CoV-2 by FDA under an Emergency Use Authorization (EUA). This EUA will remain  in effect (meaning this test can be used) for the duration of the COVID-19 declaration under Section 564(b)(1) of the Act, 21 U.S.C.section 360bbb-3(b)(1), unless the authorization is terminated  or  revoked sooner.       Influenza A by PCR NEGATIVE NEGATIVE Final   Influenza B by PCR NEGATIVE NEGATIVE Final    Comment: (NOTE) The Xpert Xpress SARS-CoV-2/FLU/RSV plus assay is intended as an aid in the diagnosis of influenza from Nasopharyngeal swab specimens and should not be used as a sole basis for treatment. Nasal washings and aspirates are unacceptable for Xpert Xpress SARS-CoV-2/FLU/RSV testing.  Fact Sheet for Patients: EntrepreneurPulse.com.au  Fact Sheet for Healthcare Providers: IncredibleEmployment.be  This test is not yet approved or cleared by the [place] FDA and has been authorized for detection and/or diagnosis of SARS-CoV-2 by FDA under an Emergency Use  Authorization (EUA). This EUA will remain in effect (meaning this test can be used) for the duration of the COVID-19 declaration under Section 564(b)(1) of the Act, 21 U.S.C. section 360bbb-3(b)(1), unless the authorization is terminated or revoked.  Performed at [facility], [address] [place], [place] [address]   SARS CORONAVIRUS 2 (TAT 6-24 HRS) Nasopharyngeal Nasopharyngeal Swab     Status: None   Collection Time: 09/20/20 12:15 AM   Specimen: Nasopharyngeal Swab  Result Value Ref Range Status   SARS Coronavirus 2 NEGATIVE NEGATIVE Final    Comment: (NOTE) SARS-CoV-2 target nucleic acids are NOT DETECTED.  The SARS-CoV-2 RNA is generally detectable in upper and lower respiratory specimens during the acute phase of infection. Negative results do not preclude SARS-CoV-2 infection, do not rule out co-infections with other pathogens, and should not be used as the sole basis for treatment or other patient management decisions. Negative results must be combined with clinical observations, patient history, and epidemiological information. The expected result is Negative.  Fact Sheet for Patients: SugarRoll.be  Fact Sheet for Healthcare Providers: https://www.woods-mathews.com/  This test is not yet approved or cleared by the [place] FDA and  has been authorized for detection and/or diagnosis of SARS-CoV-2 by FDA under an Emergency Use Authorization (EUA). This EUA will remain  in effect (meaning this test can be used) for the duration of the COVID-19 declaration under Se ction 564(b)(1) of the Act, 21 U.S.C. section 360bbb-3(b)(1), unless the authorization is terminated or revoked sooner.  Performed at [facility], [place] [address] [place], [place] [address]   Gastrointestinal Panel by PCR , Stool     Status: Abnormal   Collection Time: 09/20/20  3:10 AM   Specimen: Stool  Result Value Ref Range Status    Campylobacter species DETECTED (A) NOT DETECTED Final    Comment: RESULT CALLED TO, READ BACK BY AND VERIFIED WITH: NICHOLA GAUNTLETT AT 5053 09/20/20.PMF    Plesimonas shigelloides NOT DETECTED NOT DETECTED Final   Salmonella species NOT DETECTED NOT DETECTED Final   Yersinia enterocolitica NOT DETECTED NOT DETECTED Final   Vibrio species NOT DETECTED NOT DETECTED Final   Vibrio cholerae NOT DETECTED NOT DETECTED Final   Enteroaggregative E coli (EAEC) NOT DETECTED NOT DETECTED Final   Enteropathogenic E coli (EPEC) NOT DETECTED NOT DETECTED Final   Enterotoxigenic E coli (ETEC) NOT DETECTED NOT DETECTED Final   Shiga like toxin producing E coli (STEC) NOT DETECTED NOT DETECTED Final   Shigella/Enteroinvasive E coli (EIEC) NOT DETECTED NOT DETECTED Final   Cryptosporidium NOT DETECTED NOT DETECTED Final   Cyclospora cayetanensis NOT DETECTED NOT DETECTED Final   Entamoeba histolytica NOT DETECTED NOT DETECTED Final   Giardia lamblia NOT DETECTED NOT DETECTED Final   Adenovirus F40/41 NOT DETECTED NOT DETECTED Final  Astrovirus NOT DETECTED NOT DETECTED Final   Norovirus GI/GII NOT DETECTED NOT DETECTED Final   Rotavirus A NOT DETECTED NOT DETECTED Final   Sapovirus (I, II, IV, and V) NOT DETECTED NOT DETECTED Final    Comment: Performed at [facility], [place], [place] [address]  C Difficile Quick Screen w PCR reflex     Status: None   Collection Time: 09/20/20 11:36 AM   Specimen: STOOL  Result Value Ref Range Status   C Diff antigen NEGATIVE NEGATIVE Final   C Diff toxin NEGATIVE NEGATIVE Final   C Diff interpretation No C. difficile detected.  Final    Comment: Performed at [facility], [place] [address], [place], [place] [address]     Labs:  CBC: Recent Labs  Lab 09/17/20 2258 09/19/20 2132 09/20/20 0139 09/20/20 1142 09/20/20 1659 09/21/20 0230 09/22/20 0045  WBC 11.0* 4.7 4.6  --   --  4.4 5.7  NEUTROABS 8.9* 2.9  --   --   --   --    --   HGB 15.6* 14.3 13.5 13.1 12.9 13.0 13.1  HCT 47.4* 44.5 41.7 40.5 40.1 39.5 41.3  MCV 88.8 90.3 89.7  --   --  89.0 89.8  PLT 247 200 195  --   --  191 228   BMP &GFR Recent Labs  Lab 09/17/20 2258 09/19/20 2132 09/20/20 0139 09/21/20 0230 09/22/20 0045  NA 133* 135 138 140 138  K 3.9 3.6 4.3 3.7 3.4*  CL 96* 101 106 107 105  CO2 23 24 25 28 25   GLUCOSE 180* 121* 113* 123* 158*  BUN 29* 19 16 8 9   CREATININE 1.09* 1.06* 0.88 0.88 1.00  CALCIUM 9.3 9.1 8.8* 8.8* 8.7*  MG  --   --  1.7 1.7 1.7  PHOS  --   --  3.2 3.2 2.2*   Estimated Creatinine Clearance: 74.2 mL/min (by C-G formula based on SCr of 1 mg/dL). Liver & Pancreas: Recent Labs  Lab 09/17/20 2258 09/19/20 2132 09/20/20 0139 09/21/20 0230 09/22/20 0045  AST 24 20 18   --   --   ALT 28 23 21   --   --   ALKPHOS 66 51 47  --   --   BILITOT 0.9 1.1 1.2  --   --   PROT 7.0 6.0* 5.5*  --   --   ALBUMIN 3.8 3.3* 3.0* 2.6* 2.8*   Recent Labs  Lab 09/19/20 2132  LIPASE 39   No results for input(s): AMMONIA in the last 168 hours. Diabetic: Recent Labs    09/20/20 0139  HGBA1C 7.5*   Recent Labs  Lab 09/21/20 0651 09/21/20 1148 09/21/20 1618 09/21/20 2127 09/22/20 0653  GLUCAP 157* 107* 148* 162* 155*   Cardiac Enzymes: No results for input(s): CKTOTAL, CKMB, CKMBINDEX, TROPONINI in the last 168 hours. No results for input(s): PROBNP in the last 8760 hours. Coagulation Profile: Recent Labs  Lab 09/19/20 2132  INR 2.4*   Thyroid Function Tests: No results for input(s): TSH, T4TOTAL, FREET4, T3FREE, THYROIDAB in the last 72 hours. Lipid Profile: No results for input(s): CHOL, HDL, LDLCALC, TRIG, CHOLHDL, LDLDIRECT in the last 72 hours. Anemia Panel: No results for input(s): VITAMINB12, FOLATE, FERRITIN, TIBC, IRON, RETICCTPCT in the last 72 hours. Urine analysis:    Component Value Date/Time   COLORURINE YELLOW 09/17/2020 2337   APPEARANCEUR CLEAR 09/17/2020 2337   LABSPEC 1.015  09/17/2020 2337   PHURINE 5.5  09/17/2020 2337   GLUCOSEU >=500 (A) 09/17/2020 2337   HGBUR MODERATE (A) 09/17/2020 2337   BILIRUBINUR MODERATE (A) 09/17/2020 2337   KETONESUR 15 (A) 09/17/2020 2337   PROTEINUR NEGATIVE 09/17/2020 2337   UROBILINOGEN 0.2 01/25/2014 1943   NITRITE NEGATIVE 09/17/2020 2337   LEUKOCYTESUR NEGATIVE 09/17/2020 2337   Sepsis Labs: Invalid input(s): PROCALCITONIN, LACTICIDVEN   Time coordinating discharge: 40 minutes  SIGNED:  Mercy Riding, MD  [facility] 09/22/2020, 1:30 PM  If 7PM-7AM, please contact night-coverage [email or web address]

## 2020-09-23 ENCOUNTER — Telehealth: Payer: Self-pay | Admitting: Student in an Organized Health Care Education/Training Program

## 2020-09-23 NOTE — Telephone Encounter (Signed)
Paged by operator overnight and Ms. Ninneman was having recurrence of her atrial fibrillation.  I called her and she reported recurrent AF with associated chest pressure and palpitations with heart rate 140-150.  She was discharged from hospital yesterday following management of rectal bleeding, Hemoccult positive stool, and mild anemia (Hb 14.3 from bl 15.6).  Her home diltiazem CD 120 mg p.o. was held while she is inpatient for borderline hypotension along with her Xarelto given GIB.  She was planned to resume all of her medications at discharge.  She took one diltiazem IR 30 mg p.o. tablet with little improvement in her heart rate.  An hour later she called and asked her to go ahead and restart her long-acting diltiazem along with short acting diltiazem and resumption of her home Xarelto.  Her blood pressures were at their baseline with systolics in the 771H.  I explained that if her heart rate sustained greater than 130 despite starting diltiazem back then she should come to the ED for evaluation today.  If her heart rate is less than 110 and she is otherwise asymptomatic then she can follow-up with her AF clinic which she already has scheduled for Wednesday afternoon.  If her symptoms worsen she will come to the ED and call 911 if she needs help with transport or is unstable.  She is agreeable with this plan and would ideally like not to have to come to the emergency department if she can manage this at home.

## 2020-09-24 ENCOUNTER — Encounter (HOSPITAL_COMMUNITY): Payer: Self-pay | Admitting: Nurse Practitioner

## 2020-09-24 ENCOUNTER — Ambulatory Visit (HOSPITAL_COMMUNITY)
Admission: RE | Admit: 2020-09-24 | Discharge: 2020-09-24 | Disposition: A | Discharge status: Home / Self Care | Payer: Medicare Other | Source: Ambulatory Visit | Attending: Physician Assistant | Admitting: Physician Assistant

## 2020-09-24 ENCOUNTER — Other Ambulatory Visit: Payer: Self-pay

## 2020-09-24 VITALS — BP 124/76 | HR 64 | Ht 64.0 in | Wt 265.2 lb

## 2020-09-24 DIAGNOSIS — E785 Hyperlipidemia, unspecified: Secondary | ICD-10-CM | POA: Diagnosis not present

## 2020-09-24 DIAGNOSIS — Z8249 Family history of ischemic heart disease and other diseases of the circulatory system: Secondary | ICD-10-CM | POA: Diagnosis not present

## 2020-09-24 DIAGNOSIS — G4733 Obstructive sleep apnea (adult) (pediatric): Secondary | ICD-10-CM | POA: Insufficient documentation

## 2020-09-24 DIAGNOSIS — Z79899 Other long term (current) drug therapy: Secondary | ICD-10-CM | POA: Diagnosis not present

## 2020-09-24 DIAGNOSIS — I447 Left bundle-branch block, unspecified: Secondary | ICD-10-CM | POA: Diagnosis not present

## 2020-09-24 DIAGNOSIS — Z6841 Body Mass Index (BMI) 40.0 and over, adult: Secondary | ICD-10-CM | POA: Insufficient documentation

## 2020-09-24 DIAGNOSIS — I10 Essential (primary) hypertension: Secondary | ICD-10-CM | POA: Diagnosis not present

## 2020-09-24 DIAGNOSIS — I480 Paroxysmal atrial fibrillation: Secondary | ICD-10-CM | POA: Insufficient documentation

## 2020-09-24 DIAGNOSIS — D6869 Other thrombophilia: Secondary | ICD-10-CM

## 2020-09-24 DIAGNOSIS — Z7901 Long term (current) use of anticoagulants: Secondary | ICD-10-CM | POA: Insufficient documentation

## 2020-09-24 LAB — CBC
HCT: 42.2 % (ref 36.0–46.0)
Hemoglobin: 13.3 g/dL (ref 12.0–15.0)
MCH: 28.6 pg (ref 26.0–34.0)
MCHC: 31.5 g/dL (ref 30.0–36.0)
MCV: 90.8 fL (ref 80.0–100.0)
Platelets: 231 10*3/uL (ref 150–400)
RBC: 4.65 MIL/uL (ref 3.87–5.11)
RDW: 16.6 % — ABNORMAL HIGH (ref 11.5–15.5)
WBC: 7.1 10*3/uL (ref 4.0–10.5)
nRBC: 0 % (ref 0.0–0.2)

## 2020-09-24 LAB — BASIC METABOLIC PANEL
Anion gap: 6 (ref 5–15)
BUN: 14 mg/dL (ref 8–23)
CO2: 27 mmol/L (ref 22–32)
Calcium: 9.7 mg/dL (ref 8.9–10.3)
Chloride: 104 mmol/L (ref 98–111)
Creatinine, Ser: 0.69 mg/dL (ref 0.44–1.00)
GFR, Estimated: >60 mL/min (ref >60)
Glucose, Bld: 126 mg/dL — ABNORMAL HIGH (ref 70–99)
Potassium: 4.8 mmol/L (ref 3.5–5.1)
Sodium: 137 mmol/L (ref 135–145)

## 2020-09-24 LAB — MAGNESIUM: Magnesium: 1.6 mg/dL — ABNORMAL LOW (ref 1.7–2.4)
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
## 2020-09-25 ENCOUNTER — Other Ambulatory Visit (HOSPITAL_COMMUNITY): Payer: Self-pay | Admitting: *Deleted

## 2020-09-25 MED ORDER — MAGNESIUM OXIDE 250 MG PO TABS: 250.0000 mg | ORAL_TABLET | Freq: Every day | ORAL | 0 refills | Status: DC

## 2020-10-07 ENCOUNTER — Telehealth: Payer: Self-pay

## 2020-10-07 ENCOUNTER — Other Ambulatory Visit: Payer: Self-pay | Admitting: Gastroenterology

## 2020-10-07 NOTE — Telephone Encounter (Signed)
   [facility] Pre-operative Risk Assessment    Patient Name: Kristin [name]  DOB: 10-16-1955 MRN: [id number]  HEARTCARE STAFF:  - IMPORTANT!!!!!! Under Visit Info/Reason for Call, type in Other and utilize the format Clearance MM/DD/YY or Clearance TBD. Do not use dashes or single digits. - Please review there is not already an duplicate clearance open for this procedure. - If request is for dental extraction, please clarify the # of teeth to be extracted. - If the patient is currently at the dentist's office, call Pre-Op Callback Staff (MA/nurse) to input urgent request.  - If the patient is not currently in the dentist office, please route to the Pre-Op pool.  Request for surgical clearance:  What type of surgery is being performed? Colonoscopy   When is this surgery scheduled? 10/24/20  What type of clearance is required (medical clearance vs. Pharmacy clearance to hold med vs. Both)? both  Are there any medications that need to be held prior to surgery and how long? Xarelto   Practice name and name of physician performing surgery? [facility]. Collene Mares, MD [facility] Benson Norway, MD  What is the office phone number? [phone]   7.   What is the office fax number? [phone]  8.   Anesthesia type (None, local, MAC, general) ? Not listed.   Kristin [name] 10/07/2020, 1:47 PM  _________________________________________________________________   (provider comments below)

## 2020-10-07 NOTE — Telephone Encounter (Signed)
Dr. Marlou Porch Kristin [name] is seeking clearance for colonoscopy. Reassuring stress test in 2021. Recent echo with mention of septal motion abnormality. Does this need further workup prior to clearance for colonoscopy?  PharmD - can you please give recs for xarelto hold?

## 2020-10-07 NOTE — Telephone Encounter (Signed)
Patient with diagnosis of afib on Xarelto for anticoagulation.    Procedure: colonoscopy Date of procedure: 10/24/20  CHA2DS2-VASc Score = 3  This indicates a 3.2% annual risk of stroke. The patient's score is based upon: CHF History: No HTN History: Yes Diabetes History: Yes Stroke History: No Vascular Disease History: No Age Score: 0 Gender Score: 1    CrCl 104.8 ml/min Platelet count 231  Per office protocol, patient can hold Xarelto for 2 days prior to procedure.   \

## 2020-10-08 NOTE — Telephone Encounter (Signed)
   Name: Kristin [name]  DOB: Oct 05, 1955  MRN: [id number]   Primary Cardiologist: Candee Furbish, MD  Chart reviewed as part of pre-operative protocol coverage. [name] was last seen on 09/24/20 by Roderic Palau NP.  She had a reassuring stress test in 2021.   Per Dr. Marlou Porch: No need for further cardiac workup. OK to hold Xarelto for 2 days.  Therefore, based on ACC/AHA guidelines, the patient would be at acceptable risk for the planned procedure without further cardiovascular testing.   I will route this recommendation to the requesting party via Epic fax function and remove from pre-op pool. Please call with questions.  Tami Lin Shardea Cwynar, PA 10/08/2020, 9:05 AM

## 2020-10-16 ENCOUNTER — Encounter: Payer: Self-pay | Admitting: Cardiology

## 2020-10-16 ENCOUNTER — Other Ambulatory Visit: Payer: Self-pay

## 2020-10-16 ENCOUNTER — Ambulatory Visit (INDEPENDENT_AMBULATORY_CARE_PROVIDER_SITE_OTHER): Payer: Medicare Other | Admitting: Cardiology

## 2020-10-16 VITALS — BP 116/64 | HR 72 | Ht 63.0 in | Wt 265.0 lb

## 2020-10-16 DIAGNOSIS — I480 Paroxysmal atrial fibrillation: Secondary | ICD-10-CM

## 2020-10-16 MED ORDER — METOPROLOL SUCCINATE ER 200 MG PO TB24: 100.0000 mg | ORAL_TABLET | Freq: Every day | ORAL | 3 refills | Status: DC

## 2020-10-16 MED ORDER — FLECAINIDE ACETATE 50 MG PO TABS: 50.0000 mg | ORAL_TABLET | Freq: Two times a day (BID) | ORAL | 3 refills | Status: DC

## 2020-10-16 NOTE — Patient Instructions (Addendum)
Medication Instructions:  Your physician has recommended you make the following change in your medication: STOP Carvedilol (Coreg) START Toprol XL 200 mg once daily at bedtime START Flecainide 50 TWICE daily  *If you need a refill on your cardiac medications before your next appointment, please call your pharmacy*   Lab Work: None ordered   Testing/Procedures: None ordered   Follow-Up: At [facility], you and your health needs are our priority.  As part of our continuing mission to provide you with exceptional heart care, we have created designated Provider Care Teams.  These Care Teams include your primary Cardiologist (physician) and Advanced Practice Providers (APPs -  Physician Assistants and Nurse Practitioners) who all work together to provide you with the care you need, when you need it.  Your next appointment:   3 month(s)  The format for your next appointment:   In Person  Provider:   Allegra Lai, MD    Thank you for choosing [facility]!!   Trinidad Curet, RN [phone]   Other Instructions  Flecainide Tablets What is this medication? FLECAINIDE (FLEK a nide) prevents and treats a fast or irregular heartbeat (arrhythmia). It is often used to treat a type of arrhythmia known as AFib (atrial fibrillation). It works by slowing down overactive electric signals in the heart, which stabilizes your heart rhythm. It belongs to a group ofmedications called antiarrhythmics. This medicine may be used for other purposes; ask your health care provider orpharmacist if you have questions. COMMON BRAND NAME(S): Tambocor What should I tell my care team before I take this medication? They need to know if you have any of these conditions: Abnormal levels of potassium in the blood Heart disease including heart rhythm and heart rate problems Kidney or liver disease Recent heart attack An unusual or allergic reaction to flecainide, local anesthetics, other medications,  foods, dyes, or preservatives Pregnant or trying to get pregnant Breast-feeding How should I use this medication? Take this medication by mouth with a glass of water. Follow the directions on the prescription label. You can take this medication with or without food. Take your doses at regular intervals. Do not take your medication more often than directed. Do not stop taking this medication suddenly. This may cause serious, heart-related side effects. If your care team wants you to stop the medication,the dose may be slowly lowered over time to avoid any side effects. Talk to your care team regarding the use of this medication in children. While this medication may be prescribed for children as young as 1 year of age forselected conditions, precautions do apply. Overdosage: If you think you have taken too much of this medicine contact apoison control center or emergency room at once. NOTE: This medicine is only for you. Do not share this medicine with others. What if I miss a dose? If you miss a dose, take it as soon as you can. If it is almost time for yournext dose, take only that dose. Do not take double or extra doses. What may interact with this medication? Do not take this medication with any of the following: Amoxapine Arsenic trioxide Certain antibiotics like clarithromycin, erythromycin, gatifloxacin, gemifloxacin, levofloxacin, moxifloxacin, sparfloxacin, or troleandomycin Certain antidepressants called tricyclic antidepressants like amitriptyline, imipramine, or nortriptyline Certain medications to control heart rhythm like disopyramide, encainide, moricizine, procainamide, propafenone, and quinidine Cisapride Delavirdine Droperidol Haloperidol [place]orn Imatinib Levomethadyl Maprotiline Medications for malaria like chloroquine and halofantrine Pentamidine Phenothiazines like chlorpromazine, mesoridazine, prochlorperazine,  thioridazine Pimozide Quinine Ranolazine Ritonavir Sertindole  This medication may also interact with the following: Cimetidine Dofetilide Medications for angina or high blood pressure Medications to control heart rhythm like amiodarone and digoxin Ziprasidone This list may not describe all possible interactions. Give your health care provider a list of all the medicines, herbs, non-prescription drugs, or dietary supplements you use. Also tell them if you smoke, drink alcohol, or use illegaldrugs. Some items may interact with your medicine. What should I watch for while using this medication? Visit your care team for regular checks on your progress. Because your condition and the use of this medication carries some risk, it is a good idea to carry an identification card, necklace or bracelet with details of yourcondition, medications, and care team. Check your blood pressure and pulse rate regularly. Ask your care team what your blood pressure and pulse rate should be, and when you should contact them. Your care team also may schedule regular blood tests and electrocardiograms tocheck your progress. You may get drowsy or dizzy. Do not drive, use machinery, or do anything that needs mental alertness until you know how this medication affects you. Do not stand or sit up quickly, especially if you are an older patient. This reduces the risk of dizzy or fainting spells. Alcohol can make you more dizzy, increaseflushing and rapid heartbeats. Avoid alcoholic drinks. What side effects may I notice from receiving this medication? Side effects that you should report to your care team as soon as possible: Allergic reactions-skin rash, itching, hives, swelling of the face, lips, tongue, or throat Heart failure-shortness of breath, swelling of the ankles, feet, or hands, sudden weight gain, unusual weakness or fatigue Heart rhythm changes-fast or irregular heartbeat, dizziness, feeling faint or lightheaded,  chest pain, trouble breathing Liver injury-right upper belly pain, loss of appetite, nausea, light-colored stool, dark yellow or brown urine, yellowing skin or eyes, unusual weakness or fatigue Side effects that usually do not require medical attention (report to your careteam if they continue or are bothersome): Blurry vision Constipation Dizziness Fatigue Headache Nausea Tremors or shaking This list may not describe all possible side effects. Call your doctor for medical advice about side effects. You may report side effects to FDA at1-800-FDA-1088. Where should I keep my medication? Keep out of the reach of children and pets. Store at room temperature between 15 and 30 degrees C (59 and 86 degrees F). Protect from light. Keep container tightly closed. Throw away any unusedmedication after the expiration date. NOTE: This sheet is a summary. It may not cover all possible information. If you have questions about this medicine, talk to your doctor, pharmacist, orhealth care provider.  2022 Elsevier/Gold Standard (2020-04-17 12:17:39)      Metoprolol Extended-Release Tablets What is this medication? METOPROLOL (me TOE proe lole) treats high blood pressure and heart failure. It may also be used to prevent chest pain (angina). It works by lowering your blood pressure and heart rate, making it easier for your heart to pump blood to the rest of your body. It belongs to a group of medications called betablockers. This medicine may be used for other purposes; ask your health care provider orpharmacist if you have questions. COMMON BRAND NAME(S): toprol, Toprol XL What should I tell my care team before I take this medication? They need to know if you have any of these conditions: Diabetes Heart or vessel disease like slow heart rate, worsening heart failure, heart block, sick sinus syndrome, or Raynaud's disease Kidney disease Liver disease Lung or breathing disease, like  asthma or  emphysema Pheochromocytoma Thyroid disease An unusual or allergic reaction to metoprolol, other beta blockers, medications, foods, dyes, or preservatives Pregnant or trying to get pregnant Breast-feeding How should I use this medication? Take this medication by mouth. Take it as directed on the prescription label at the same time every day. Take it with food. You may cut the tablet in half if it is scored (has a line in the middle of it). This may help you swallow the tablet if the whole tablet is too big. Be sure to take both halves. Do not take just one-half of the tablet. Keep taking it unless your care team tells you tostop. Talk to your care team about the use of this medication in children. While it may be prescribed for children as young as 6 years for selected conditions,precautions do apply. Overdosage: If you think you have taken too much of this medicine contact apoison control center or emergency room at once. NOTE: This medicine is only for you. Do not share this medicine with others. What if I miss a dose? If you miss a dose, take it as soon as you can. If it is almost time for yournext dose, take only that dose. Do not take double or extra doses. What may interact with this medication? This medication may interact with the following: Certain medications for blood pressure, heart disease, irregular heartbeat Certain medications for depression, like monoamine oxidase (MAO) inhibitors, fluoxetine, or paroxetine Clonidine Dobutamine Epinephrine Isoproterenol Reserpine This list may not describe all possible interactions. Give your health care provider a list of all the medicines, herbs, non-prescription drugs, or dietary supplements you use. Also tell them if you smoke, drink alcohol, or use illegaldrugs. Some items may interact with your medicine. What should I watch for while using this medication? Visit your care team for regular checks on your progress. Check your blood  pressure as directed. Ask your care team what your blood pressure should be.Also, find out when you should contact them. Do not treat yourself for coughs, colds, or pain while you are using this medication without asking your care team for advice. Some medications mayincrease your blood pressure. You may get drowsy or dizzy. Do not drive, use machinery, or do anything that needs mental alertness until you know how this medication affects you. Do not stand up or sit up quickly, especially if you are an older patient. This reduces the risk of dizzy or fainting spells. Alcohol may interfere with theeffect of this medication. Avoid alcoholic drinks. This medication may increase blood sugar. Ask your care team if changes in dietor medications are needed if you have diabetes. What side effects may I notice from receiving this medication? Side effects that you should report to your care team as soon as possible: Allergic reactions-skin rash, itching, hives, swelling of the face, lips, tongue, or throat Heart failure-shortness of breath, swelling of the ankles, feet, or hands, sudden weight gain, unusual weakness or fatigue Low blood pressure-dizziness, feeling faint or lightheaded, blurry vision Raynaud's-cool, numb, or painful fingers or toes that may change color from pale, to blue, to red Slow heartbeat-dizziness, feeling faint or lightheaded, confusion, trouble breathing, unusual weakness or fatigue Worsening mood, feelings of depression Side effects that usually do not require medical attention (report to your careteam if they continue or are bothersome): Change in sex drive or performance Diarrhea Dizziness Fatigue Headache This list may not describe all possible side effects. Call your doctor for medical advice about side effects.  You may report side effects to FDA at1-800-FDA-1088. Where should I keep my medication? Keep out of the reach of children and pets. Store at room temperature between  20 and 25 degrees C (68 and 77 degrees F).Throw away any unused medication after the expiration date. NOTE: This sheet is a summary. It may not cover all possible information. If you have questions about this medicine, talk to your doctor, pharmacist, orhealth care provider.  2022 Elsevier/Gold Standard (2020-04-17 12:55:47)
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
## 2020-10-20 ENCOUNTER — Telehealth: Payer: Self-pay | Admitting: Cardiology

## 2020-10-20 NOTE — Telephone Encounter (Signed)
Patient calling to reschedule her nurse visit. She states she has not started the medication yet, because she could not pick it up. She states she will pick it up today.

## 2020-10-20 NOTE — Progress Notes (Signed)
Attempted to obtain medical history via telephone, unable to reach at this time. I left a voicemail to return pre surgical testing department's phone call.  

## 2020-10-20 NOTE — Telephone Encounter (Signed)
Will forward to Dr. Curt Bears nurse.

## 2020-10-21 ENCOUNTER — Ambulatory Visit: Payer: Medicare Other

## 2020-10-24 ENCOUNTER — Encounter (HOSPITAL_COMMUNITY): Admission: RE | Payer: Self-pay | Source: Home / Self Care

## 2020-10-24 ENCOUNTER — Ambulatory Visit (HOSPITAL_COMMUNITY): Admission: RE | Admit: 2020-10-24 | Payer: Medicare Other | Source: Home / Self Care | Admitting: Gastroenterology

## 2020-10-24 SURGERY — COLONOSCOPY WITH PROPOFOL
Anesthesia: Monitor Anesthesia Care

## 2020-11-05 ENCOUNTER — Ambulatory Visit: Payer: Medicare Other | Admitting: Cardiology

## 2020-11-14 ENCOUNTER — Encounter: Payer: Self-pay | Admitting: Student

## 2020-11-14 ENCOUNTER — Other Ambulatory Visit: Payer: Self-pay

## 2020-11-14 ENCOUNTER — Ambulatory Visit (INDEPENDENT_AMBULATORY_CARE_PROVIDER_SITE_OTHER): Payer: Medicare Other | Admitting: Student

## 2020-11-14 VITALS — BP 130/78 | HR 68 | Ht 63.0 in | Wt 273.4 lb

## 2020-11-14 DIAGNOSIS — I10 Essential (primary) hypertension: Secondary | ICD-10-CM

## 2020-11-14 DIAGNOSIS — I480 Paroxysmal atrial fibrillation: Secondary | ICD-10-CM

## 2020-11-14 MED ORDER — METOPROLOL SUCCINATE ER 100 MG PO TB24: 100.0000 mg | ORAL_TABLET | Freq: Every day | ORAL | 3 refills | Status: DC

## 2020-11-14 MED ORDER — DILTIAZEM HCL ER COATED BEADS 180 MG PO CP24: 180.0000 mg | ORAL_CAPSULE | Freq: Every day | ORAL | 3 refills | Status: DC
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
## 2020-11-14 NOTE — Patient Instructions (Addendum)
Medication Instructions:  Your physician has recommended you make the following change in your medication:   INCREASE: Diltiazem to 160m daily  *If you need a refill on your cardiac medications before your next appointment, please call your pharmacy*   Lab Work: TODAY: BMET, CBC  If you have labs (blood work) drawn today and your tests are completely normal, you will receive your results only by: [place](if you have MyChart) OR A paper copy in the mail If you have any lab test that is abnormal or we need to change your treatment, we will call you to review the results.   Follow-Up: [facility] and your [facility] needs are our priority.  As part of our continuing mission to provide you with exceptional heart care, we have created designated Provider Care Teams.  These Care Teams include your primary Cardiologist (physician) and Advanced Practice Providers (APPs -  Physician Assistants and Nurse Practitioners) who all work together to provide you with the care you need, when you need it.   Your next appointment:   12/09/2020 with AOda Kilts PA-C

## 2020-11-15 LAB — BASIC METABOLIC PANEL
BUN/Creatinine Ratio: 19 (ref 12–28)
BUN: 16 mg/dL (ref 8–27)
CO2: 26 mmol/L (ref 20–29)
Calcium: 9.8 mg/dL (ref 8.7–10.3)
Chloride: 97 mmol/L (ref 96–106)
Creatinine, Ser: 0.83 mg/dL (ref 0.57–1.00)
Glucose: 223 mg/dL — ABNORMAL HIGH (ref 65–99)
Potassium: 4.6 mmol/L (ref 3.5–5.2)
Sodium: 139 mmol/L (ref 134–144)
eGFR: 79 mL/min/{1.73_m2} (ref >59)

## 2020-11-15 LAB — CBC
Hematocrit: 46.1 % (ref 34.0–46.6)
Hemoglobin: 14.7 g/dL (ref 11.1–15.9)
MCH: 28.5 pg (ref 26.6–33.0)
MCHC: 31.9 g/dL (ref 31.5–35.7)
MCV: 89 fL (ref 79–97)
Platelets: 275 10*3/uL (ref 150–450)
RBC: 5.16 x10E6/uL (ref 3.77–5.28)
RDW: 14.8 % (ref 11.7–15.4)
WBC: 7.7 10*3/uL (ref 3.4–10.8)

## 2020-12-09 ENCOUNTER — Ambulatory Visit: Payer: Medicare Other | Admitting: Student

## 2020-12-09 ENCOUNTER — Encounter: Payer: Self-pay | Admitting: Student

## 2020-12-09 ENCOUNTER — Other Ambulatory Visit: Payer: Self-pay

## 2020-12-09 VITALS — BP 122/72 | HR 67 | Ht 63.0 in | Wt 271.9 lb

## 2020-12-09 DIAGNOSIS — G4733 Obstructive sleep apnea (adult) (pediatric): Secondary | ICD-10-CM | POA: Diagnosis not present

## 2020-12-09 DIAGNOSIS — I10 Essential (primary) hypertension: Secondary | ICD-10-CM | POA: Diagnosis not present

## 2020-12-09 DIAGNOSIS — I447 Left bundle-branch block, unspecified: Secondary | ICD-10-CM

## 2020-12-09 DIAGNOSIS — I480 Paroxysmal atrial fibrillation: Secondary | ICD-10-CM | POA: Diagnosis not present

## 2020-12-09 NOTE — Patient Instructions (Signed)
Medication Instructions:  Your physician recommends that you continue on your current medications as directed. Please refer to the Current Medication list given to you today.  *If you need a refill on your cardiac medications before your next appointment, please call your pharmacy*   Lab Work: None If you have labs (blood work) drawn today and your tests are completely normal, you will receive your results only by: [place] (if you have MyChart) OR A paper copy in the mail If you have any lab test that is abnormal or we need to change your treatment, we will call you to review the results.   Follow-Up: At [facility], you and your health needs are our priority.  As part of our continuing mission to provide you with exceptional heart care, we have created designated Provider Care Teams.  These Care Teams include your primary Cardiologist (physician) and Advanced Practice Providers (APPs -  Physician Assistants and Nurse Practitioners) who all work together to provide you with the care you need, when you need it.   Your next appointment:   2-3 month(s)  The format for your next appointment:   In Person  Provider:   You may see Will Meredith Leeds, MD or one of the following Advanced Practice Providers on your designated Care Team:   Tommye Standard, [place] Legrand Como "[facility]" [place], [place]
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
## 2021-01-10 ENCOUNTER — Other Ambulatory Visit: Payer: Self-pay | Admitting: Cardiology

## 2021-01-22 ENCOUNTER — Ambulatory Visit: Payer: Medicare Other | Admitting: Cardiology

## 2021-02-15 ENCOUNTER — Emergency Department (HOSPITAL_BASED_OUTPATIENT_CLINIC_OR_DEPARTMENT_OTHER)
Admission: EM | Admit: 2021-02-15 | Discharge: 2021-02-16 | Disposition: A | Discharge status: Home / Self Care | Payer: Medicare Other | Attending: Emergency Medicine | Admitting: Emergency Medicine

## 2021-02-15 ENCOUNTER — Other Ambulatory Visit: Payer: Self-pay

## 2021-02-15 ENCOUNTER — Encounter (HOSPITAL_BASED_OUTPATIENT_CLINIC_OR_DEPARTMENT_OTHER): Payer: Self-pay | Admitting: Emergency Medicine

## 2021-02-15 DIAGNOSIS — I10 Essential (primary) hypertension: Secondary | ICD-10-CM | POA: Diagnosis not present

## 2021-02-15 DIAGNOSIS — S61214A Laceration without foreign body of right ring finger without damage to nail, initial encounter: Secondary | ICD-10-CM | POA: Diagnosis not present

## 2021-02-15 DIAGNOSIS — W01198A Fall on same level from slipping, tripping and stumbling with subsequent striking against other object, initial encounter: Secondary | ICD-10-CM | POA: Diagnosis not present

## 2021-02-15 DIAGNOSIS — Z794 Long term (current) use of insulin: Secondary | ICD-10-CM | POA: Insufficient documentation

## 2021-02-15 DIAGNOSIS — Z23 Encounter for immunization: Secondary | ICD-10-CM | POA: Diagnosis not present

## 2021-02-15 DIAGNOSIS — S62664B Nondisplaced fracture of distal phalanx of right ring finger, initial encounter for open fracture: Secondary | ICD-10-CM | POA: Diagnosis not present

## 2021-02-15 DIAGNOSIS — Y92520 Airport as the place of occurrence of the external cause: Secondary | ICD-10-CM | POA: Insufficient documentation

## 2021-02-15 DIAGNOSIS — I480 Paroxysmal atrial fibrillation: Secondary | ICD-10-CM | POA: Diagnosis not present

## 2021-02-15 DIAGNOSIS — Z7901 Long term (current) use of anticoagulants: Secondary | ICD-10-CM | POA: Diagnosis not present

## 2021-02-15 DIAGNOSIS — Z7984 Long term (current) use of oral hypoglycemic drugs: Secondary | ICD-10-CM | POA: Diagnosis not present

## 2021-02-15 DIAGNOSIS — E1140 Type 2 diabetes mellitus with diabetic neuropathy, unspecified: Secondary | ICD-10-CM | POA: Diagnosis not present

## 2021-02-15 DIAGNOSIS — Z79899 Other long term (current) drug therapy: Secondary | ICD-10-CM | POA: Insufficient documentation

## 2021-02-15 DIAGNOSIS — S6991XA Unspecified injury of right wrist, hand and finger(s), initial encounter: Secondary | ICD-10-CM | POA: Diagnosis present

## 2021-02-15 LAB — CBG MONITORING, ED: Glucose-Capillary: 230 mg/dL — ABNORMAL HIGH (ref 70–99)

## 2021-02-15 MED ORDER — CEPHALEXIN 500 MG PO CAPS: 500.0000 mg | ORAL_CAPSULE | Freq: Four times a day (QID) | ORAL | 0 refills | Status: DC

## 2021-02-15 MED ORDER — CEPHALEXIN 250 MG PO CAPS
500.0000 mg | ORAL_CAPSULE | Freq: Once | ORAL | Status: AC
  Administered 2021-02-15: 500 mg via ORAL
  Filled 2021-02-15: qty 2

## 2021-02-15 MED ORDER — TETANUS-DIPHTH-ACELL PERTUSSIS 5-2.5-18.5 LF-MCG/0.5 IM SUSY
0.5000 mL | PREFILLED_SYRINGE | Freq: Once | INTRAMUSCULAR | Status: AC
  Administered 2021-02-15: 0.5 mL via INTRAMUSCULAR
  Filled 2021-02-15: qty 0.5

## 2021-02-15 MED ORDER — CEPHALEXIN 250 MG PO CAPS: 250.0000 mg | ORAL_CAPSULE | Freq: Once | ORAL | Status: DC

## 2021-02-15 MED ORDER — FLUCONAZOLE 200 MG PO TABS: 200.0000 mg | ORAL_TABLET | Freq: Every day | ORAL | 0 refills | Status: DC

## 2021-02-15 NOTE — ED Triage Notes (Signed)
[place] at airport last [place], injuring RT 4th digit- sts "I split it open"; was seen at [facility] today and has a copy of XR; sts +fracture and was referred here d/t open fx
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
## 2021-02-16 MED ORDER — FLUCONAZOLE 200 MG PO TABS: 200.0000 mg | ORAL_TABLET | Freq: Every day | ORAL | 0 refills | Status: AC

## 2021-02-16 MED ORDER — DOXYCYCLINE HYCLATE 100 MG PO CAPS: 100.0000 mg | ORAL_CAPSULE | Freq: Two times a day (BID) | ORAL | 0 refills | Status: DC

## 2021-02-16 NOTE — ED Notes (Signed)
Discharge instructions discussed with pt. Pt verbalized understanding with no questions at this time.
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
## 2021-02-17 ENCOUNTER — Other Ambulatory Visit: Payer: Self-pay | Admitting: Cardiology

## 2021-02-17 DIAGNOSIS — S62639A Displaced fracture of distal phalanx of unspecified finger, initial encounter for closed fracture: Secondary | ICD-10-CM | POA: Insufficient documentation

## 2021-02-18 ENCOUNTER — Other Ambulatory Visit: Payer: Self-pay | Admitting: Cardiology

## 2021-02-23 ENCOUNTER — Telehealth: Payer: Self-pay | Admitting: Cardiology

## 2021-02-23 DIAGNOSIS — I480 Paroxysmal atrial fibrillation: Secondary | ICD-10-CM

## 2021-02-23 MED ORDER — RIVAROXABAN 20 MG PO TABS: 20.0000 mg | ORAL_TABLET | Freq: Every day | ORAL | 1 refills | Status: DC

## 2021-02-23 NOTE — Telephone Encounter (Signed)
Patient calling the office for samples of medication:   1.  What medication and dosage are you requesting samples for? XARELTO 20 MG TABS tablet   2.  Are you currently out of this medication? Yes
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
## 2021-02-23 NOTE — Telephone Encounter (Signed)
Xarelto 43m refill request received. Pt is 65years old, weight-125.6kg, Crea-0.83 on 11/14/2020, last seen by AOda Kilts PA on 12/09/2020, Diagnosis-Afib, CrCl-135.738mmin; Dose is appropriate based on dosing criteria. Will send in refill to requested pharmacy.     Last Xarelto via our office is documented as 10/15/2014; with 90 day supply  [facility] does not assist with cost or give samples, will send to [facility] since she thinks her Mom threw out the meds to see if cost or samples can be given.

## 2021-02-24 NOTE — Telephone Encounter (Signed)
Left message to call back  

## 2021-02-26 NOTE — Telephone Encounter (Signed)
Patient states her friend Nonnie Done will be coming today to pick up her samples, because she is sick.

## 2021-02-26 NOTE — Telephone Encounter (Signed)
Per [place], Production assistant, radio, pt was given samples of Xarelto and left at the front desk for pt to pick up.

## 2021-03-19 ENCOUNTER — Ambulatory Visit: Payer: Medicare Other | Admitting: Cardiology
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
## 2021-04-09 ENCOUNTER — Ambulatory Visit: Payer: Medicare Other | Admitting: Physician Assistant

## 2021-09-21 ENCOUNTER — Encounter (HOSPITAL_BASED_OUTPATIENT_CLINIC_OR_DEPARTMENT_OTHER): Payer: Self-pay | Admitting: Cardiology

## 2021-09-21 ENCOUNTER — Ambulatory Visit (HOSPITAL_BASED_OUTPATIENT_CLINIC_OR_DEPARTMENT_OTHER): Payer: Medicare Other | Admitting: Cardiology

## 2021-09-21 DIAGNOSIS — Z7901 Long term (current) use of anticoagulants: Secondary | ICD-10-CM | POA: Diagnosis not present

## 2021-09-21 DIAGNOSIS — R600 Localized edema: Secondary | ICD-10-CM | POA: Diagnosis not present

## 2021-09-21 DIAGNOSIS — I480 Paroxysmal atrial fibrillation: Secondary | ICD-10-CM

## 2021-09-21 DIAGNOSIS — I447 Left bundle-branch block, unspecified: Secondary | ICD-10-CM

## 2021-09-21 NOTE — Assessment & Plan Note (Addendum)
Continues with flecainide 50 mg twice a day.  She does have a left bundle branch block underlying.  Her QRS has been carefully monitored.  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  She did not want to be hospitalized.  Ablation therapy has also been discussed by Dr. Curt Bears.  Does not desire this because of caregiver role.  At last visit with Barrington Ellison she was feeling well without any flutter type symptoms.  Continue with Toprol 100 mg a day.  Medication management.  QRS duration on EKG today is 156.  Prior EKG showed 166.
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
## 2021-09-22 ENCOUNTER — Encounter: Payer: Self-pay | Admitting: Cardiology

## 2021-11-09 ENCOUNTER — Other Ambulatory Visit: Payer: Self-pay | Admitting: Student

## 2021-11-09 DIAGNOSIS — G2581 Restless legs syndrome: Secondary | ICD-10-CM | POA: Insufficient documentation

## 2021-11-09 DIAGNOSIS — L719 Rosacea, unspecified: Secondary | ICD-10-CM | POA: Insufficient documentation

## 2021-11-09 DIAGNOSIS — Z6841 Body Mass Index (BMI) 40.0 and over, adult: Secondary | ICD-10-CM | POA: Insufficient documentation

## 2021-11-09 DIAGNOSIS — F5081 Binge eating disorder: Secondary | ICD-10-CM | POA: Insufficient documentation

## 2021-11-09 DIAGNOSIS — I700 Atherosclerosis of aorta: Secondary | ICD-10-CM | POA: Insufficient documentation

## 2021-11-09 DIAGNOSIS — D6869 Other thrombophilia: Secondary | ICD-10-CM | POA: Insufficient documentation

## 2021-11-09 DIAGNOSIS — F32A Depression, unspecified: Secondary | ICD-10-CM | POA: Insufficient documentation

## 2021-11-09 DIAGNOSIS — I10 Essential (primary) hypertension: Secondary | ICD-10-CM | POA: Insufficient documentation

## 2021-11-09 DIAGNOSIS — F3340 Major depressive disorder, recurrent, in remission, unspecified: Secondary | ICD-10-CM | POA: Insufficient documentation

## 2021-11-09 DIAGNOSIS — N83209 Unspecified ovarian cyst, unspecified side: Secondary | ICD-10-CM | POA: Insufficient documentation

## 2021-11-09 DIAGNOSIS — F419 Anxiety disorder, unspecified: Secondary | ICD-10-CM | POA: Insufficient documentation

## 2021-11-09 DIAGNOSIS — N393 Stress incontinence (female) (male): Secondary | ICD-10-CM | POA: Insufficient documentation

## 2021-11-09 DIAGNOSIS — G43909 Migraine, unspecified, not intractable, without status migrainosus: Secondary | ICD-10-CM | POA: Insufficient documentation

## 2021-11-09 DIAGNOSIS — F900 Attention-deficit hyperactivity disorder, predominantly inattentive type: Secondary | ICD-10-CM | POA: Insufficient documentation

## 2021-11-09 DIAGNOSIS — K219 Gastro-esophageal reflux disease without esophagitis: Secondary | ICD-10-CM | POA: Insufficient documentation

## 2021-11-09 DIAGNOSIS — N3946 Mixed incontinence: Secondary | ICD-10-CM | POA: Insufficient documentation

## 2021-12-10 ENCOUNTER — Other Ambulatory Visit: Payer: Self-pay | Admitting: Cardiology

## 2021-12-10 ENCOUNTER — Other Ambulatory Visit: Payer: Self-pay | Admitting: Student

## 2022-01-06 ENCOUNTER — Other Ambulatory Visit: Payer: Self-pay | Admitting: Cardiology

## 2022-01-09 ENCOUNTER — Other Ambulatory Visit: Payer: Self-pay | Admitting: Cardiology

## 2022-02-15 ENCOUNTER — Telehealth: Payer: Self-pay | Admitting: Cardiology

## 2022-02-15 ENCOUNTER — Other Ambulatory Visit: Payer: Self-pay | Admitting: Cardiology

## 2022-02-15 NOTE — Telephone Encounter (Signed)
*  STAT* If patient is at the pharmacy, call can be transferred to refill team.   1. Which medications need to be refilled? (please list name of each medication and dose if known)   metoprolol succinate (TOPROL-XL) 100 MG 24 hr tablet   2. Which pharmacy/location (including street and city if local pharmacy) is medication to be sent to?  [facility] [place], [place]   3. Do they need a 30 day or 90 day supply?  90 day  Patient stated she will run out of this medication prior to her appointment on  03/11/22.

## 2022-03-03 ENCOUNTER — Other Ambulatory Visit: Payer: Self-pay | Admitting: Cardiology

## 2022-03-03 NOTE — Telephone Encounter (Signed)
Rx refill sent to pharmacy. 

## 2022-03-08 NOTE — Telephone Encounter (Signed)
90 day refill X's 1 was sent in 03/03/22.

## 2022-03-08 NOTE — Telephone Encounter (Signed)
Pt made an appt for 04/20/22 with [place] at 12:00pm. Pt states she still needs this medication

## 2022-03-11 ENCOUNTER — Ambulatory Visit: Payer: Medicare Other | Admitting: Cardiology
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
## 2022-04-18 ENCOUNTER — Other Ambulatory Visit: Payer: Self-pay | Admitting: Cardiology

## 2022-04-20 ENCOUNTER — Ambulatory Visit: Payer: Medicare Other | Attending: Student | Admitting: Student

## 2022-04-20 ENCOUNTER — Telehealth: Payer: Self-pay | Admitting: Cardiology

## 2022-04-20 ENCOUNTER — Encounter: Payer: Self-pay | Admitting: Student

## 2022-04-20 VITALS — BP 126/64 | HR 75 | Ht 64.0 in | Wt 271.4 lb

## 2022-04-20 DIAGNOSIS — I480 Paroxysmal atrial fibrillation: Secondary | ICD-10-CM | POA: Diagnosis not present

## 2022-04-20 DIAGNOSIS — I10 Essential (primary) hypertension: Secondary | ICD-10-CM

## 2022-04-20 MED ORDER — DILTIAZEM HCL 30 MG PO TABS: 30.0000 mg | ORAL_TABLET | Freq: Four times a day (QID) | ORAL | 2 refills | Status: DC | PRN

## 2022-04-20 NOTE — Telephone Encounter (Signed)
Will send in Diltiazem 30 mg PRN q6h per pt request. Approved by Oda Kilts, PA

## 2022-04-20 NOTE — Telephone Encounter (Signed)
Patient c/o Palpitations:  High priority if patient c/o lightheadedness, shortness of breath, or chest pain  How long have you had palpitations/irregular HR/ Afib? Are you having the symptoms now? Yes   Are you currently experiencing lightheadedness, SOB or CP? no  Do you have a history of afib (atrial fibrillation) or irregular heart rhythm? yes  Have you checked your BP or HR? (document readings if available): HR 115; 70  Are you experiencing any other symptoms? Aching in the jaws and throat.

## 2022-04-20 NOTE — Telephone Encounter (Signed)
Pt saw A Tillery PA today and since she has been home she has developed "irregular" heart beat... she says her watch says her rate is fluctuating between 85 and 115... her O2 sat 85% and she has a dull ache in her jaw and throat which she says she always has when she is in Afib.. she denies chest pain and no dizziness... she will go ahead and take a Diltiazem 30 gm that she says she has at home for PRN use and she will monitor. I will forward to Dr Curt Bears for his review.

## 2022-04-20 NOTE — Patient Instructions (Signed)
Medication Instructions:   Your physician recommends that you continue on your current medications as directed. Please refer to the Current Medication list given to you today.   *If you need a refill on your cardiac medications before your next appointment, please call your pharmacy*   Lab Work:  TODAY !!!!!! BMET/CBC  If you have labs (blood work) drawn today and your tests are completely normal, you will receive your results only by: [place] (if you have MyChart) OR A paper copy in the mail If you have any lab test that is abnormal or we need to change your treatment, we will call you to review the results.   Testing/Procedures:  None    Follow-Up: At [facility], you and your health needs are our priority.  As part of our continuing mission to provide you with exceptional heart care, we have created designated Provider Care Teams.  These Care Teams include your primary Cardiologist (physician) and Advanced Practice Providers (APPs -  Physician Assistants and Nurse Practitioners) who all work together to provide you with the care you need, when you need it.  We recommend signing up for the patient portal called "MyChart".  Sign up information is provided on this After Visit Summary.  MyChart is used to connect with patients for Virtual Visits (Telemedicine).  Patients are able to view lab/test results, encounter notes, upcoming appointments, etc.  Non-urgent messages can be sent to your provider as well.   To learn more about what you can do with MyChart, go to [email or web address].    Your next appointment:   6 month(s)  Provider:   Allegra Lai, MD    Other Instructions  Your physician wants you to follow-up in: 6 months with Dr. Curt Bears.  You will receive a reminder letter in the mail two months in advance. If you don't receive a letter, please call our office to schedule the follow-up appointment.

## 2022-04-21 LAB — CBC
Hematocrit: 46.4 % (ref 34.0–46.6)
Hemoglobin: 15.1 g/dL (ref 11.1–15.9)
MCH: 29 pg (ref 26.6–33.0)
MCHC: 32.5 g/dL (ref 31.5–35.7)
MCV: 89 fL (ref 79–97)
Platelets: 259 10*3/uL (ref 150–450)
RBC: 5.2 x10E6/uL (ref 3.77–5.28)
RDW: 13.4 % (ref 11.7–15.4)
WBC: 6.7 10*3/uL (ref 3.4–10.8)

## 2022-04-21 LAB — BASIC METABOLIC PANEL
BUN/Creatinine Ratio: 23 (ref 12–28)
BUN: 23 mg/dL (ref 8–27)
CO2: 24 mmol/L (ref 20–29)
Calcium: 10.1 mg/dL (ref 8.7–10.3)
Chloride: 96 mmol/L (ref 96–106)
Creatinine, Ser: 0.99 mg/dL (ref 0.57–1.00)
Glucose: 369 mg/dL — ABNORMAL HIGH (ref 70–99)
Potassium: 4.3 mmol/L (ref 3.5–5.2)
Sodium: 137 mmol/L (ref 134–144)
eGFR: 63 mL/min/{1.73_m2} (ref >59)

## 2022-04-30 ENCOUNTER — Ambulatory Visit: Payer: Medicare Other | Admitting: Cardiology

## 2022-05-06 ENCOUNTER — Encounter (HOSPITAL_COMMUNITY): Payer: Self-pay | Admitting: *Deleted

## 2022-05-27 ENCOUNTER — Ambulatory Visit: Payer: Medicare Other | Admitting: Allergy

## 2022-05-27 ENCOUNTER — Encounter: Payer: Self-pay | Admitting: Allergy

## 2022-05-27 ENCOUNTER — Other Ambulatory Visit: Payer: Self-pay

## 2022-05-27 VITALS — BP 134/64 | HR 71 | Temp 97.9°F | Resp 17 | Ht 63.5 in | Wt 266.6 lb

## 2022-05-27 DIAGNOSIS — T783XXD Angioneurotic edema, subsequent encounter: Secondary | ICD-10-CM

## 2022-05-27 MED ORDER — FAMOTIDINE 20 MG PO TABS: 20.0000 mg | ORAL_TABLET | Freq: Every day | ORAL | 5 refills | Status: DC

## 2022-05-27 NOTE — Patient Instructions (Signed)
Lip and facial swelling - discussed today isolated swelling could be histamine (allergy driven) vs non-histamine driven (like HAE) - HAE or hereditary angioedema can cause swelling attacks of any part of the body but facial swelling is very common.  HAE does not improve with allergy medication/antihistamines - will have you add Pepcid '20mg'$  1 tab daily to your Zyrtec '10mg'$  daily at this time - will obtain labwork for histamine and non-histamine driven processes (including alpha gal panel, tryptase, environmental panel and HAE panel) - agree with remaining of Ibesartan as this category of medications can cause lip/oral swelling  Follow-up in 2-3 months or sooner if needed
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
## 2022-06-07 LAB — ALLERGENS W/TOTAL IGE AREA 2

## 2022-06-07 LAB — C4 COMPLEMENT: Complement C4, Serum: 52 mg/dL — ABNORMAL HIGH (ref 12–38)

## 2022-06-07 LAB — ALPHA-GAL PANEL
Allergen Lamb IgE: <0.1 kU/L
Beef IgE: <0.1 kU/L
IgE (Immunoglobulin E), Serum: 10 IU/mL (ref 6–495)
O215-IgE Alpha-Gal: <0.1 kU/L
Pork IgE: <0.1 kU/L

## 2022-06-07 LAB — COMPLEMENT COMPONENT C1Q: Complement C1Q: 11.4 mg/dL (ref 10.3–20.5)

## 2022-06-07 LAB — TRYPTASE: Tryptase: 6.9 ug/L (ref 2.2–13.2)

## 2022-06-07 LAB — C1 ESTERASE INHIBITOR, FUNCTIONAL: C1INH Functional/C1INH Total MFr SerPl: >93 %mean normal

## 2022-06-07 LAB — C1 ESTERASE INHIBITOR: C1INH SerPl-mCnc: 51 mg/dL — ABNORMAL HIGH (ref 21–39)

## 2022-06-15 ENCOUNTER — Encounter: Payer: Self-pay | Admitting: Allergy

## 2022-07-08 ENCOUNTER — Encounter: Payer: Self-pay | Admitting: Cardiology

## 2022-07-08 ENCOUNTER — Ambulatory Visit: Payer: Medicare Other | Attending: Cardiology | Admitting: Cardiology

## 2022-07-08 VITALS — BP 130/80 | HR 64 | Ht 63.5 in | Wt 270.0 lb

## 2022-07-08 DIAGNOSIS — I480 Paroxysmal atrial fibrillation: Secondary | ICD-10-CM | POA: Diagnosis not present

## 2022-07-08 DIAGNOSIS — I447 Left bundle-branch block, unspecified: Secondary | ICD-10-CM | POA: Diagnosis not present

## 2022-07-08 DIAGNOSIS — Z7901 Long term (current) use of anticoagulants: Secondary | ICD-10-CM | POA: Diagnosis not present
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
## 2022-07-08 NOTE — Patient Instructions (Signed)
Medication Instructions:  The current medical regimen is effective;  continue present plan and medications.  *If you need a refill on your cardiac medications before your next appointment, please call your pharmacy*  Follow-Up: At [facility], you and your health needs are our priority.  As part of our continuing mission to provide you with exceptional heart care, we have created designated Provider Care Teams.  These Care Teams include your primary Cardiologist (physician) and Advanced Practice Providers (APPs -  Physician Assistants and Nurse Practitioners) who all work together to provide you with the care you need, when you need it.  We recommend signing up for the patient portal called "MyChart".  Sign up information is provided on this After Visit Summary.  MyChart is used to connect with patients for Virtual Visits (Telemedicine).  Patients are able to view lab/test results, encounter notes, upcoming appointments, etc.  Non-urgent messages can be sent to your provider as well.   To learn more about what you can do with MyChart, go to [email or web address].    Your next appointment:   6 month(s)  Provider:   Tessa Conte, PA-C, Ernest Dick, NP, Michele Lenze, PA-C, Michelle Swinyer, NP, or Scott Weaver, PA-C     Then, Mark Skains, MD will plan to see you again in 1 year(s).     

## 2022-07-29 ENCOUNTER — Encounter: Payer: Self-pay | Admitting: Allergy

## 2022-07-29 ENCOUNTER — Other Ambulatory Visit: Payer: Self-pay

## 2022-07-29 ENCOUNTER — Ambulatory Visit (INDEPENDENT_AMBULATORY_CARE_PROVIDER_SITE_OTHER): Payer: Medicare Other | Admitting: Allergy

## 2022-07-29 VITALS — BP 134/78 | HR 63 | Temp 98.1°F | Resp 16 | Ht 63.5 in | Wt 270.0 lb

## 2022-07-29 DIAGNOSIS — T783XXD Angioneurotic edema, subsequent encounter: Secondary | ICD-10-CM

## 2022-07-29 MED ORDER — CETIRIZINE HCL 10 MG PO TABS: 10.0000 mg | ORAL_TABLET | Freq: Every day | ORAL | 5 refills | Status: DC | PRN

## 2022-07-29 MED ORDER — FAMOTIDINE 20 MG PO TABS
20.0000 mg | ORAL_TABLET | Freq: Every day | ORAL | 5 refills | Status: DC
Start: 2022-07-29 — End: 2023-01-27

## 2022-07-29 NOTE — Progress Notes (Signed)
Follow-up Note  RE: Kristin [name] MRN: [id number] DOB: 14-Jul-1955 Date of Office Visit: 07/29/2022   History of present illness: Kristin [name] is a 67 y.o. female presenting today for follow-up of angioedema.  She was last seen in the office on 05/27/2022 by myself.  Since this visit she has not had any major swelling episodes of the lips.  She does feel like her lips are still a bit swollen have not returned to normal yet.  She states the other day her friend thought her lips did look a bit puffier than usual.  She has been taking Zyrtec daily but not taking at this time Pepcid.  She has remained off of irbesartan.  She is not sure if there is something else that might be keeping her lip slightly swollen like food or something she has been exposed to.  She has not changed her diet however related to this.  Review of systems: Review of Systems  Constitutional: Negative.   HENT: Negative.    Eyes: Negative.   Respiratory: Negative.    Cardiovascular: Negative.   Gastrointestinal: Negative.   Musculoskeletal: Negative.   Skin:        See HPI  Allergic/Immunologic: Negative.   Neurological: Negative.      All other systems negative unless noted above in HPI  Past medical/social/surgical/family history have been reviewed and are unchanged unless specifically indicated below.  No changes  Medication List: Current Outpatient Medications  Medication Sig Dispense Refill   acetaminophen (TYLENOL) 500 MG tablet Take 1,000 mg by mouth daily as needed for headache.     ALPRAZolam (XANAX) 0.25 MG tablet Take 0.25 mg by mouth daily as needed (afib/panic attacks).     atorvastatin (LIPITOR) 20 MG tablet Take 20 mg by mouth at bedtime.     buPROPion (WELLBUTRIN XL) 150 MG 24 hr tablet Take 150 mg by mouth every morning.     cetirizine (ZYRTEC) 10 MG tablet Take 10 mg by mouth daily as needed for allergies.     Cholecalciferol (VITAMIN D-3) 125 MCG (5000 UT) TABS Take 5,000 Units by mouth  at bedtime.     diclofenac Sodium (VOLTAREN) 1 % GEL Apply 2-4 g topically daily as needed (pain).     diltiazem (CARDIZEM CD) 180 MG 24 hr capsule TAKE 1 CAPSULE(180 MG) BY MOUTH DAILY 90 capsule 3   diltiazem (CARDIZEM) 30 MG tablet Take 1 tablet (30 mg total) by mouth every 6 (six) hours as needed (for elevated heart rates). 30 tablet 2   empagliflozin (JARDIANCE) 10 MG TABS tablet Take 10 mg by mouth every morning.     famotidine (PEPCID) 20 MG tablet Take 1 tablet (20 mg total) by mouth daily. 30 tablet 5   flecainide (TAMBOCOR) 50 MG tablet TAKE 1 TABLET(50 MG) BY MOUTH TWICE DAILY 180 tablet 2   furosemide (LASIX) 20 MG tablet Take 1 tablet (20 mg total) by mouth every morning. 30 tablet    Magnesium Oxide 250 MG TABS Take 1 tablet (250 mg total) by mouth daily.  0   methylphenidate (RITALIN) 10 MG tablet Take 10 mg by mouth as needed (narcoleptic episodes when driving long distances.).      metoprolol succinate (TOPROL-XL) 100 MG 24 hr tablet Take 1 tablet (100 mg total) by mouth daily. 90 tablet 3   metroNIDAZOLE (METROCREAM) 0.75 % cream Apply 1 application topically daily as needed (rosacea).      omeprazole (PRILOSEC OTC) 20 MG tablet Take 20  mg by mouth at bedtime.     OZEMPIC, 0.25 OR 0.5 MG/DOSE, 2 MG/1.5ML SOPN SMARTSIG:0.25 Milligram(s) SUB-Q Once a Week     Polyethylene Glycol 400 (BLINK TEARS) 0.25 % SOLN Place 1 drop into both eyes daily as needed (dry eyes).     rivaroxaban (XARELTO) 20 MG TABS tablet Take 1 tablet (20 mg total) by mouth daily with supper. 90 tablet 1   rOPINIRole (REQUIP) 2 MG tablet Take 1-2 mg by mouth See admin instructions. Take 1/2 tablet (1 mg) by mouth with supper as needed for restless legs, take 1 tablet (2 mg) daily at bedtime     Vilazodone HCl 20 MG TABS Take 20 mg by mouth daily.     clotrimazole-betamethasone (LOTRISONE) cream Apply 1 application  topically daily as needed (rash.). (Patient not taking: Reported on 07/29/2022)     No current  facility-administered medications for this visit.     Known medication allergies: Allergies  Allergen Reactions   Dilaudid [Hydromorphone]     Other reaction(s): Respiratory Distress, Unknown   Sulfa Antibiotics Other (See Comments) and Itching    Yeast infection/itching   Dilaudid [Hydromorphone Hcl] Other (See Comments)    Changed respirations   Irbesartan Swelling   Keflex [Cephalexin] Other (See Comments)    Yeast infection/itching   Metformin Hcl Diarrhea   Sulindac Other (See Comments)    Yeast infection/itching Other reaction(s): Unknown   Tramadol Itching    itching     Physical examination: Blood pressure 134/78, pulse 63, temperature 98.1 F (36.7 C), resp. rate 16, height 5' 3.5" (1.613 m), weight 270 lb (122.5 kg), SpO2 93 %.  General: Alert, interactive, in no acute distress. HEENT: PERRLA, TMs pearly gray, turbinates non-edematous without discharge, post-pharynx non erythematous. Neck: Supple without lymphadenopathy. Lungs: Clear to auscultation without wheezing, rhonchi or rales. {no increased work of breathing. CV: Normal S1, S2 without murmurs. Abdomen: Nondistended, nontender. Skin: Warm and dry, without lesions or rashes. Extremities:  No clubbing, cyanosis or edema. Neuro:   Grossly intact.  Diagnositics/Labs: Labs:  Component     Latest Ref Rng 05/27/2022  D Pteronyssinus IgE     Class 0 kU/L <0.10   D Farinae IgE     Class 0 kU/L <0.10   Cat Dander IgE     Class 0 kU/L <0.10   Dog Dander IgE     Class 0 kU/L <0.10   [place] Grass IgE     Class 0 kU/L <0.10   Timothy Grass IgE     Class 0 kU/L <0.10   [place] Grass IgE     Class 0 kU/L <0.10   Cockroach, German IgE     Class 0 kU/L <0.10   Penicillium Chrysogen IgE     Class 0 kU/L <0.10   Cladosporium Herbarum IgE     Class 0 kU/L <0.10   Aspergillus Fumigatus IgE     Class 0 kU/L <0.10   Alternaria Alternata IgE     Class 0 kU/L <0.10   Maple/Box Elder IgE     Class 0 kU/L <0.10    Common Silver Charletta Cousin IgE     Class 0 kU/L <0.10   Cedar, [place] IgE     Class 0 kU/L <0.10   Oak, White IgE     Class 0 kU/L <0.10   Elm, American IgE     Class 0 kU/L <0.10   C[place] IgE     Class 0 kU/L <0.10   [place], [place]  IgE     Class 0 kU/L <0.10   White Mulberry IgE     Class 0 kU/L <0.10   Ragweed, Short IgE     Class 0 kU/L <0.10   Pigweed, Rough IgE     Class 0 kU/L <0.10   Sheep Sorrel IgE Qn     Class 0 kU/L <0.10   Mouse Urine IgE     Class 0 kU/L <0.10   Class Description Allergens Comment   IgE (Immunoglobulin E), Serum     6 - 495 IU/mL 10   [id number] Alpha-Gal     Class 0 kU/L <0.10   Beef IgE     Class 0 kU/L <0.10   Pork IgE     Class 0 kU/L <0.10   Allergen Lamb IgE     Class 0 kU/L <0.10   Tryptase     2.2 - 13.2 ug/L 6.9   C1INH Functional/C1INH Total MFr SerPl     %mean normal >93   C1INH SerPl-mCnc     21 - 39 mg/dL 51 (H)   Complement C4, Serum     12 - 38 mg/dL 52 (H)   Complement Y8M     10.3 - 20.5 mg/dL 57.8    Assessment and plan: Facial angioedema - symptoms have improved and stable but not resolved completely - discussed today isolated swelling could be histamine (allergy driven) vs non-histamine driven (like HAE) - HAE or hereditary angioedema can cause swelling attacks of any part of the body but facial swelling is very common.  HAE does not improve with allergy medication/antihistamines - will have you add Pepcid 20mg  1 tab daily to your Zyrtec 10mg  daily at this time - labwork obtained was reassuring:  Environmental panel is negative Alpha gal panel is negative thus does not have red meat allergy Hereditary swelling panel was unremarkable Tryptase level is normal thus does not have a hyperactive allergy cells - remaining off Ibesartan as this category of medications can cause lip/oral swelling  Follow-up in 4-6 months or sooner if needed  I appreciate the opportunity to take part in Aisling's care. Please do not  hesitate to contact me with questions.  Sincerely,   Margo Aye, MD Allergy/Immunology [facility]

## 2022-07-29 NOTE — Patient Instructions (Addendum)
Lip and facial swelling - symptoms have improved and stable but not resolved completely - discussed today isolated swelling could be histamine (allergy driven) vs non-histamine driven (like HAE) - HAE or hereditary angioedema can cause swelling attacks of any part of the body but facial swelling is very common.  HAE does not improve with allergy medication/antihistamines - will have you add Pepcid 20mg  1 tab daily to your Zyrtec 10mg  daily at this time - labwork obtained was reassuring:  Environmental panel is negative Alpha gal panel is negative thus does not have red meat allergy Hereditary swelling panel was unremarkable Tryptase level is normal thus does not have a hyperactive allergy cells - remaining off Ibesartan as this category of medications can cause lip/oral swelling  Follow-up in 4-6 months or sooner if needed

## 2022-08-28 ENCOUNTER — Other Ambulatory Visit: Payer: Self-pay | Admitting: Cardiology

## 2022-10-17 ENCOUNTER — Other Ambulatory Visit: Payer: Self-pay

## 2022-10-17 ENCOUNTER — Emergency Department (HOSPITAL_BASED_OUTPATIENT_CLINIC_OR_DEPARTMENT_OTHER): Payer: Medicare Other

## 2022-10-17 ENCOUNTER — Emergency Department (HOSPITAL_BASED_OUTPATIENT_CLINIC_OR_DEPARTMENT_OTHER)
Admission: EM | Admit: 2022-10-17 | Discharge: 2022-10-17 | Disposition: A | Discharge status: Home / Self Care | Payer: Medicare Other | Attending: Emergency Medicine | Admitting: Emergency Medicine

## 2022-10-17 ENCOUNTER — Encounter (HOSPITAL_BASED_OUTPATIENT_CLINIC_OR_DEPARTMENT_OTHER): Payer: Self-pay | Admitting: Emergency Medicine

## 2022-10-17 DIAGNOSIS — W19XXXA Unspecified fall, initial encounter: Secondary | ICD-10-CM

## 2022-10-17 DIAGNOSIS — Z7901 Long term (current) use of anticoagulants: Secondary | ICD-10-CM | POA: Insufficient documentation

## 2022-10-17 DIAGNOSIS — S2231XA Fracture of one rib, right side, initial encounter for closed fracture: Secondary | ICD-10-CM | POA: Diagnosis not present

## 2022-10-17 DIAGNOSIS — W0110XA Fall on same level from slipping, tripping and stumbling with subsequent striking against unspecified object, initial encounter: Secondary | ICD-10-CM | POA: Diagnosis not present

## 2022-10-17 DIAGNOSIS — I447 Left bundle-branch block, unspecified: Secondary | ICD-10-CM | POA: Diagnosis not present

## 2022-10-17 DIAGNOSIS — S299XXA Unspecified injury of thorax, initial encounter: Secondary | ICD-10-CM | POA: Diagnosis present

## 2022-10-17 DIAGNOSIS — S0990XA Unspecified injury of head, initial encounter: Secondary | ICD-10-CM

## 2022-10-17 LAB — COMPREHENSIVE METABOLIC PANEL
ALT: 36 U/L (ref 0–44)
AST: 33 U/L (ref 15–41)
Albumin: 3.6 g/dL (ref 3.5–5.0)
Alkaline Phosphatase: 82 U/L (ref 38–126)
Anion gap: 11 (ref 5–15)
BUN: 13 mg/dL (ref 8–23)
CO2: 23 mmol/L (ref 22–32)
Calcium: 8.9 mg/dL (ref 8.9–10.3)
Chloride: 98 mmol/L (ref 98–111)
Creatinine, Ser: 0.85 mg/dL (ref 0.44–1.00)
GFR, Estimated: >60 mL/min (ref >60)
Glucose, Bld: 487 mg/dL — ABNORMAL HIGH (ref 70–99)
Potassium: 3.7 mmol/L (ref 3.5–5.1)
Sodium: 132 mmol/L — ABNORMAL LOW (ref 135–145)
Total Bilirubin: 0.6 mg/dL (ref 0.3–1.2)
Total Protein: 7 g/dL (ref 6.5–8.1)

## 2022-10-17 LAB — CBC WITH DIFFERENTIAL/PLATELET
Abs Immature Granulocytes: 0.03 10*3/uL (ref 0.00–0.07)
Basophils Absolute: 0.1 10*3/uL (ref 0.0–0.1)
Basophils Relative: 1 %
Eosinophils Absolute: 0.1 10*3/uL (ref 0.0–0.5)
Eosinophils Relative: 2 %
HCT: 46.5 % — ABNORMAL HIGH (ref 36.0–46.0)
Hemoglobin: 14.8 g/dL (ref 12.0–15.0)
Immature Granulocytes: 1 %
Lymphocytes Relative: 23 %
Lymphs Abs: 1.5 10*3/uL (ref 0.7–4.0)
MCH: 28.7 pg (ref 26.0–34.0)
MCHC: 31.8 g/dL (ref 30.0–36.0)
MCV: 90.3 fL (ref 80.0–100.0)
Monocytes Absolute: 0.6 10*3/uL (ref 0.1–1.0)
Monocytes Relative: 9 %
Neutro Abs: 4.3 10*3/uL (ref 1.7–7.7)
Neutrophils Relative %: 64 %
Platelets: 249 10*3/uL (ref 150–400)
RBC: 5.15 MIL/uL — ABNORMAL HIGH (ref 3.87–5.11)
RDW: 14.7 % (ref 11.5–15.5)
WBC: 6.6 10*3/uL (ref 4.0–10.5)
nRBC: 0 % (ref 0.0–0.2)

## 2022-10-17 MED ORDER — HYDROCODONE-ACETAMINOPHEN 5-325 MG PO TABS: 1.0000 | ORAL_TABLET | Freq: Four times a day (QID) | ORAL | 0 refills | Status: DC | PRN

## 2022-10-17 NOTE — ED Provider Notes (Signed)
EMERGENCY DEPARTMENT AT [facility]  Provider Note  CSN: [id number] Arrival date & time: 10/17/22 0043  History Chief Complaint  Patient presents with   Kristin [name] is a 67 y.o. female reports she tripped and fell about 3 days ago, hitting her head and injuring her R ribs. She has had persistent R rib pain, worse with deep breath and movement since then. She did not have LOC but she is on a blood thinner. She called her doctor who sent Rx for muscle relaxer today, she took one dose and slept all day long, waking up just a short time ago, confused about what day it was and whether she had taken her medications or eaten all day. She did not report any improvement in pain.    Home Medications Prior to Admission medications   Medication Sig Start Date End Date Taking? Authorizing Provider  HYDROcodone-acetaminophen (NORCO/VICODIN) 5-325 MG tablet Take 1 tablet by mouth every 6 (six) hours as needed for severe pain. 10/17/22  Yes Pollyann Savoy, MD  acetaminophen (TYLENOL) 500 MG tablet Take 1,000 mg by mouth daily as needed for headache.    [provider]  ALPRAZolam Prudy Feeler) 0.25 MG tablet Take 0.25 mg by mouth daily as needed (afib/panic attacks). 08/14/19   [provider]  atorvastatin (LIPITOR) 20 MG tablet Take 20 mg by mouth at bedtime. 03/02/19   [provider]  buPROPion (WELLBUTRIN XL) 150 MG 24 hr tablet Take 150 mg by mouth every morning. 07/21/22   [provider]  cetirizine (ZYRTEC) 10 MG tablet Take 1 tablet (10 mg total) by mouth daily as needed for allergies. 07/29/22   Marcelyn Bruins, MD  Cholecalciferol (VITAMIN D-3) 125 MCG (5000 UT) TABS Take 5,000 Units by mouth at bedtime.    [provider]  clotrimazole-betamethasone (LOTRISONE) cream Apply 1 application  topically daily as needed (rash.). Patient not taking: Reported on 07/29/2022    [provider]  diclofenac Sodium  (VOLTAREN) 1 % GEL Apply 2-4 g topically daily as needed (pain).    [provider]  diltiazem (CARDIZEM CD) 180 MG 24 hr capsule TAKE 1 CAPSULE(180 MG) BY MOUTH DAILY 08/30/22   Jake Bathe, MD  diltiazem (CARDIZEM) 30 MG tablet Take 1 tablet (30 mg total) by mouth every 6 (six) hours as needed (for elevated heart rates). 04/20/22   Graciella Freer, PA-C  empagliflozin (JARDIANCE) 10 MG TABS tablet Take 10 mg by mouth every morning.    [provider]  famotidine (PEPCID) 20 MG tablet Take 1 tablet (20 mg total) by mouth daily. 07/29/22   Marcelyn Bruins, MD  flecainide (TAMBOCOR) 50 MG tablet TAKE 1 TABLET(50 MG) BY MOUTH TWICE DAILY 01/07/22   Camnitz, Andree Coss, MD  furosemide (LASIX) 20 MG tablet Take 1 tablet (20 mg total) by mouth every morning. 09/29/20   Almon Hercules, MD  Magnesium Oxide 250 MG TABS Take 1 tablet (250 mg total) by mouth daily. 09/25/20   Newman Nip, NP  methylphenidate (RITALIN) 10 MG tablet Take 10 mg by mouth as needed (narcoleptic episodes when driving long distances.).     [provider]  metoprolol succinate (TOPROL-XL) 100 MG 24 hr tablet Take 1 tablet (100 mg total) by mouth daily. 04/20/22   Jake Bathe, MD  metroNIDAZOLE (METROCREAM) 0.75 % cream Apply 1 application topically daily as needed (rosacea).     [provider]  omeprazole (PRILOSEC OTC) 20 MG tablet Take 20 mg by mouth at bedtime.    [provider]  OZEMPIC, 0.25 OR 0.5 MG/DOSE, 2 MG/1.5ML SOPN SMARTSIG:0.25 Milligram(s) SUB-Q Once a Week 11/24/20   [provider]  Polyethylene Glycol 400 (BLINK TEARS) 0.25 % SOLN Place 1 drop into both eyes daily as needed (dry eyes).    [provider]  rivaroxaban (XARELTO) 20 MG TABS tablet Take 1 tablet (20 mg total) by mouth daily with supper. 02/23/21   Jake Bathe, MD  rOPINIRole (REQUIP) 2 MG tablet Take 1-2 mg by mouth See admin instructions. Take 1/2 tablet (1 mg) by  mouth with supper as needed for restless legs, take 1 tablet (2 mg) daily at bedtime 07/29/20   [provider]  Vilazodone HCl 20 MG TABS Take 20 mg by mouth daily.    [provider]     Allergies    Dilaudid [hydromorphone], Sulfa antibiotics, Dilaudid [hydromorphone hcl], Irbesartan, Keflex [cephalexin], Metformin hcl, Sulindac, and Tramadol   Review of Systems   Review of Systems Please see HPI for pertinent positives and negatives  Physical Exam BP (!) 119/47   Pulse 79   Temp 98.4 F (36.9 C) (Oral)   Resp 16   Wt 124.7 kg   SpO2 97%   BMI 47.95 kg/m   Physical Exam Vitals and nursing note reviewed.  Constitutional:      Appearance: Normal appearance.  HENT:     Head: Normocephalic and atraumatic.     Nose: Nose normal.     Mouth/Throat:     Mouth: Mucous membranes are moist.  Eyes:     Extraocular Movements: Extraocular movements intact.     Conjunctiva/sclera: Conjunctivae normal.  Cardiovascular:     Rate and Rhythm: Normal rate.  Pulmonary:     Effort: Pulmonary effort is normal.     Breath sounds: Normal breath sounds.  Chest:     Chest wall: Tenderness (R anterolateral chest wall) present.  Abdominal:     General: Abdomen is flat.     Palpations: Abdomen is soft.     Tenderness: There is no abdominal tenderness.  Musculoskeletal:        General: No swelling. Normal range of motion.     Cervical back: Neck supple. No tenderness.  Skin:    General: Skin is warm and dry.  Neurological:     General: No focal deficit present.     Mental Status: She is alert.  Psychiatric:        Mood and Affect: Mood normal.     ED Results / Procedures / Treatments   EKG EKG Interpretation Date/Time:  Sunday October 17 2022 00:56:58 EDT Ventricular Rate:  90 PR Interval:  176 QRS Duration:  154 QT Interval:  393 QTC Calculation: 481 R Axis:   -66  Text Interpretation: Sinus rhythm Left bundle branch block No significant change since last  tracing Confirmed by Susy Frizzle [phone]) on 10/17/2022 1:04:35 AM  Procedures Procedures  Medications Ordered in the ED Medications - No data to display  Initial Impression and Plan  Patient here with chest wall/rib pain after mechanical fall several days ago, also had a head injury at that time. Denies any cranial symptoms except for a headache she developed after waking up this evening. Will start with rib xrays, may require CT as well.   ED Course   Clinical Course as of 10/17/22 0341  Wynelle Link Oct 17, 2022  0130 CBC is  unremarkable.  [CS]  0203 I personally viewed the images from radiology studies and agree with radiologist interpretation:  Rib xray shows a 10th rib fracture, will send for CT had and CT chest to eval other occult injuries.  [CS]  [id number] I personally viewed the images from radiology studies and agree with radiologist interpretation: CT head is neg. CT chest does not show a 10th rib fx, but a possible 9th rib. Either way, no PTX or displaced fracture. She reports she is comfortable managing symptoms at home with Rx for norco. Will give an incentive spirometer, instructed on pillow splinting. PCP follow up, RTED for any other concerns.   [CS]    Clinical Course User Index [CS] Pollyann Savoy, MD     MDM Rules/Calculators/A&P Medical Decision Making Problems Addressed: Closed fracture of one rib of right side, initial encounter: acute illness or injury Fall, initial encounter: acute illness or injury Injury of head, initial encounter: acute illness or injury  Amount and/or Complexity of Data Reviewed Labs: ordered. Decision-making details documented in ED Course. Radiology: ordered and independent interpretation performed. Decision-making details documented in ED Course. ECG/medicine tests: ordered and independent interpretation performed. Decision-making details documented in ED Course.  Risk Prescription drug management.     Final Clinical Impression(s)  / ED Diagnoses Final diagnoses:  Fall, initial encounter  Injury of head, initial encounter  Closed fracture of one rib of right side, initial encounter    Rx / DC Orders ED Discharge Orders          Ordered    HYDROcodone-acetaminophen (NORCO/VICODIN) 5-325 MG tablet  Every 6 hours PRN        10/17/22 0340             Pollyann Savoy, MD 10/17/22 [phone]

## 2022-10-17 NOTE — Discharge Instructions (Signed)
Take the prescribed pain medications as needed. Avoid taking additional Tylenol (acetaminophen) at the same time.

## 2022-10-17 NOTE — ED Notes (Signed)
Patient resting quietly in stretcher, respirations even, unlabored, no acute distress noted. Denies needs at this time.  

## 2022-10-17 NOTE — ED Notes (Signed)
Reviewed AVS with patient, patient expressed understanding of directions, denies further questions at this time. 

## 2022-10-17 NOTE — ED Triage Notes (Signed)
Pt in with R chest pain and HA, states she tripped and fell over a rug 3 nights ago and landed forward onto her chest, hit head vs cement, takes Xarelto daily. No LOC night of fall, is c/o some confusion and memory loss since. Pain worse with deep breaths

## 2022-12-10 ENCOUNTER — Other Ambulatory Visit: Payer: Self-pay | Admitting: Cardiology

## 2022-12-16 ENCOUNTER — Ambulatory Visit: Payer: Medicare Other | Admitting: Allergy

## 2022-12-24 ENCOUNTER — Ambulatory Visit (INDEPENDENT_AMBULATORY_CARE_PROVIDER_SITE_OTHER): Payer: Medicare Other | Admitting: Obstetrics

## 2022-12-24 DIAGNOSIS — N393 Stress incontinence (female) (male): Secondary | ICD-10-CM
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
## 2022-12-27 NOTE — Progress Notes (Signed)
Appt. cancelled due to weather.   

## 2023-01-17 ENCOUNTER — Ambulatory Visit: Payer: Medicare Other | Admitting: Podiatry

## 2023-01-27 ENCOUNTER — Encounter: Payer: Self-pay | Admitting: Allergy

## 2023-01-27 ENCOUNTER — Other Ambulatory Visit: Payer: Self-pay

## 2023-01-27 ENCOUNTER — Other Ambulatory Visit: Payer: Self-pay | Admitting: Allergy

## 2023-01-27 ENCOUNTER — Ambulatory Visit: Payer: Medicare Other | Admitting: Allergy

## 2023-01-27 VITALS — BP 132/84 | HR 69 | Temp 97.3°F | Resp 20

## 2023-01-27 DIAGNOSIS — T783XXD Angioneurotic edema, subsequent encounter: Secondary | ICD-10-CM | POA: Diagnosis not present

## 2023-01-27 MED ORDER — CETIRIZINE HCL 10 MG PO TABS: 10.0000 mg | ORAL_TABLET | Freq: Every day | ORAL | 5 refills | Status: AC

## 2023-01-27 MED ORDER — FAMOTIDINE 20 MG PO TABS: 20.0000 mg | ORAL_TABLET | Freq: Every day | ORAL | 5 refills | Status: DC

## 2023-01-27 NOTE — Progress Notes (Signed)
Follow-up Note  RE: MASSEY VOLK MRN: [id number] DOB: 1956-01-25 Date of Office Visit: 01/27/2023   History of present illness: Kristin [name] is a 67 y.o. female presenting today for follow-up of angioedema.  She was last seen in the office on 07/29/22 by myself.   Discussed the use of AI scribe software for clinical note transcription with the patient, who gave verbal consent to proceed.  She reports improvement in her condition over the past two months, both in terms of sensation and visible reduction in lip swelling. She is unsure if the improvement is due to the antihistamine regimen or a change in her CPAP machine. She uses a nasal mask for her CPAP therapy. She has stopped taking Pepcid as per previous recommendation, but continues to take Zyrtec daily. She states she has been experiencing frequent falls of late. She also reports a recent burn from a curling iron on her left wrist. The patient's previous lab work, including an environmental panel, tryptase level, hereditary swelling panel, and food allergy tests, were all unremarkable. The patient was previously on a medication believed to have caused the initial swelling, irbesartan, but she has since discontinued it.     Review of systems: 10pt ROS negative unless noted above in HPI   All other systems negative unless noted above in HPI  Past medical/social/surgical/family history have been reviewed and are unchanged unless specifically indicated below.  No changes  Medication List: Current Outpatient Medications  Medication Sig Dispense Refill   acetaminophen (TYLENOL) 500 MG tablet Take 1,000 mg by mouth daily as needed for headache.     ALPRAZolam (XANAX) 0.25 MG tablet Take 0.25 mg by mouth daily as needed (afib/panic attacks).     atorvastatin (LIPITOR) 20 MG tablet Take 20 mg by mouth at bedtime.     buPROPion (WELLBUTRIN XL) 150 MG 24 hr tablet Take 150 mg by mouth every morning.     cetirizine (ZYRTEC) 10 MG  tablet TAKE 1 TABLET(10 MG) BY MOUTH DAILY AS NEEDED FOR ALLERGIES 30 tablet 5   Cholecalciferol (VITAMIN D-3) 125 MCG (5000 UT) TABS Take 5,000 Units by mouth at bedtime.     clotrimazole-betamethasone (LOTRISONE) cream Apply 1 application  topically daily as needed (rash.).     diclofenac Sodium (VOLTAREN) 1 % GEL Apply 2-4 g topically daily as needed (pain).     diltiazem (CARDIZEM CD) 180 MG 24 hr capsule TAKE 1 CAPSULE(180 MG) BY MOUTH DAILY 90 capsule 3   diltiazem (CARDIZEM) 30 MG tablet Take 1 tablet (30 mg total) by mouth every 6 (six) hours as needed (for elevated heart rates). 30 tablet 2   empagliflozin (JARDIANCE) 10 MG TABS tablet Take 10 mg by mouth every morning.     flecainide (TAMBOCOR) 50 MG tablet TAKE 1 TABLET(50 MG) BY MOUTH TWICE DAILY 180 tablet 2   furosemide (LASIX) 20 MG tablet Take 1 tablet (20 mg total) by mouth every morning. 30 tablet    Magnesium Oxide 250 MG TABS Take 1 tablet (250 mg total) by mouth daily.  0   methylphenidate (RITALIN) 10 MG tablet Take 10 mg by mouth as needed (narcoleptic episodes when driving long distances.).      metoprolol succinate (TOPROL-XL) 100 MG 24 hr tablet Take 1 tablet (100 mg total) by mouth daily. 90 tablet 3   metroNIDAZOLE (METROCREAM) 0.75 % cream Apply 1 application topically daily as needed (rosacea).      mupirocin ointment (BACTROBAN) 2 % Apply 1 Application  topically 3 (three) times daily.     omeprazole (PRILOSEC OTC) 20 MG tablet Take 20 mg by mouth at bedtime.     OZEMPIC, 0.25 OR 0.5 MG/DOSE, 2 MG/1.5ML SOPN SMARTSIG:0.25 Milligram(s) SUB-Q Once a Week     Polyethylene Glycol 400 (BLINK TEARS) 0.25 % SOLN Place 1 drop into both eyes daily as needed (dry eyes).     rivaroxaban (XARELTO) 20 MG TABS tablet Take 1 tablet (20 mg total) by mouth daily with supper. 90 tablet 1   rOPINIRole (REQUIP) 2 MG tablet Take 1-2 mg by mouth See admin instructions. Take 1/2 tablet (1 mg) by mouth with supper as needed for restless legs,  take 1 tablet (2 mg) daily at bedtime     tiZANidine (ZANAFLEX) 2 MG tablet Take 2 mg by mouth 3 (three) times daily as needed.     Vilazodone HCl 20 MG TABS Take 20 mg by mouth daily.     famotidine (PEPCID) 20 MG tablet TAKE 1 TABLET(20 MG) BY MOUTH DAILY (Patient not taking: Reported on 01/27/2023) 30 tablet 5   HYDROcodone-acetaminophen (NORCO/VICODIN) 5-325 MG tablet Take 1 tablet by mouth every 6 (six) hours as needed for severe pain. (Patient not taking: Reported on 01/27/2023) 12 tablet 0   No current facility-administered medications for this visit.     Known medication allergies: Allergies  Allergen Reactions   Dilaudid [Hydromorphone]     Other reaction(s): Respiratory Distress, Unknown   Sulfa Antibiotics Other (See Comments) and Itching    Yeast infection/itching   Dilaudid [Hydromorphone Hcl] Other (See Comments)    Changed respirations   Irbesartan Swelling   Keflex [Cephalexin] Other (See Comments)    Yeast infection/itching   Metformin Hcl Diarrhea   Sulindac Other (See Comments)    Yeast infection/itching Other reaction(s): Unknown   Tramadol Itching    itching     Physical examination: Blood pressure 132/84, pulse 69, temperature (!) 97.3 F (36.3 C), temperature source Temporal, resp. rate 20, SpO2 94%.  General: Alert, interactive, in no acute distress. HEENT: PERRLA, TMs pearly gray, turbinates non-edematous without discharge, post-pharynx non erythematous. Neck: Supple without lymphadenopathy. Lungs: Clear to auscultation without wheezing, rhonchi or rales. {no increased work of breathing. CV: Normal S1, S2 without murmurs. Abdomen: Nondistended, nontender. Skin: Linear scabbed area on the left wrist . Extremities:  No clubbing, cyanosis or edema. Neuro:   Grossly intact.  Diagnositics/Labs: None today  Assessment and plan: Lip and facial swelling - symptoms have improved  - isolated lip swelling likely initially triggered by the Ibesartan  medication that you are no longer on. - continue Zyrtec 10mg  1tab daily - add Pepcid 20mg  1 tab daily to your Zyrtec 10mg  daily if swelling worsens or becomes more frequent - labwork obtained was reassuring:  Environmental panel is negative Alpha gal panel is negative thus does not have red meat allergy Hereditary swelling panel was unremarkable Tryptase level is normal thus does not have a hyperactive allergy cells - remaining off Ibesartan as this category of medications can cause lip/oral swelling  Follow-up in 6-12 months or sooner if needed  I appreciate the opportunity to take part in Dima's care. Please do not hesitate to contact me with questions.  Sincerely,   Margo Aye, MD Allergy/Immunology [facility]

## 2023-01-27 NOTE — Patient Instructions (Addendum)
Lip and facial swelling - symptoms have improved  - isolated lip swelling likely initially triggered by the Ibesartan medication that you are no longer on. - continue Zyrtec 10mg  1tab daily - add Pepcid 20mg  1 tab daily to your Zyrtec 10mg  daily if swelling worsens or becomes more frequent - labwork obtained was reassuring:  Environmental panel is negative Alpha gal panel is negative thus does not have red meat allergy Hereditary swelling panel was unremarkable Tryptase level is normal thus does not have a hyperactive allergy cells - remaining off Ibesartan as this category of medications can cause lip/oral swelling  Follow-up in 6-12 months or sooner if needed

## 2023-01-31 ENCOUNTER — Ambulatory Visit: Payer: Medicare Other | Admitting: Obstetrics

## 2023-01-31 ENCOUNTER — Encounter: Payer: Self-pay | Admitting: Obstetrics

## 2023-01-31 VITALS — BP 140/80 | HR 69 | Ht 63.78 in | Wt 259.4 lb

## 2023-01-31 DIAGNOSIS — K5900 Constipation, unspecified: Secondary | ICD-10-CM | POA: Insufficient documentation

## 2023-01-31 DIAGNOSIS — N3946 Mixed incontinence: Secondary | ICD-10-CM

## 2023-01-31 DIAGNOSIS — R351 Nocturia: Secondary | ICD-10-CM | POA: Diagnosis not present

## 2023-01-31 DIAGNOSIS — Z8742 Personal history of other diseases of the female genital tract: Secondary | ICD-10-CM | POA: Diagnosis not present

## 2023-01-31 DIAGNOSIS — N83202 Unspecified ovarian cyst, left side: Secondary | ICD-10-CM

## 2023-01-31 DIAGNOSIS — N83201 Unspecified ovarian cyst, right side: Secondary | ICD-10-CM

## 2023-01-31 LAB — POCT URINALYSIS DIPSTICK
Bilirubin, UA: NEGATIVE
Blood, UA: NEGATIVE
Glucose, UA: POSITIVE — AB
Ketones, UA: NEGATIVE
Leukocytes, UA: NEGATIVE
Nitrite, UA: NEGATIVE
Protein, UA: NEGATIVE
Spec Grav, UA: 1.025 (ref 1.010–1.025)
Urobilinogen, UA: 0.2 U/dL
pH, UA: 5 (ref 5.0–8.0)

## 2023-01-31 MED ORDER — TROSPIUM CHLORIDE ER 60 MG PO CP24: 1.0000 | ORAL_CAPSULE | Freq: Every day | ORAL | 2 refills | Status: DC

## 2023-01-31 MED ORDER — GEMTESA 75 MG PO TABS: 75.0000 mg | ORAL_TABLET | Freq: Every day | ORAL | 2 refills | Status: DC

## 2023-01-31 MED ORDER — GEMTESA 75 MG PO TABS: 75.0000 mg | ORAL_TABLET | Freq: Every day | ORAL | Status: DC

## 2023-01-31 NOTE — Assessment & Plan Note (Signed)
For constipation, we reviewed the importance of a better bowel regimen.  We also discussed the importance of avoiding chronic straining, as it can exacerbate her pelvic floor symptoms; we discussed treating constipation and straining prior to surgery, as postoperative straining can lead to damage to the repair and recurrence of symptoms. We discussed initiating therapy with increasing fluid intake, fiber supplementation, stool softeners, and laxatives such as miralax.  - start fiber supplementation or miralax, discussed timing of fiber supplementation and avoid administration within 2 hours of other medications.  - discussed association with urinary leakage

## 2023-01-31 NOTE — Assessment & Plan Note (Signed)
For night time frequency: - avoid fluid intake after 6pm - elevated feet during the day or use compression socks to reduce lower extremity swelling - switch your diuretic (e.g. furosemide) dosing to 5-6pm (7-8 hours before bedtime) - continue CPAP for sleep apnea

## 2023-01-31 NOTE — Assessment & Plan Note (Addendum)
-   POCT UA + glucose likely due to T2DM medications - urgency > stress - We discussed the symptoms of overactive bladder (OAB), which include urinary urgency, urinary frequency, nocturia, with or without urge incontinence.  While we do not know the exact etiology of OAB, several treatment options exist. We discussed management including behavioral therapy (decreasing bladder irritants, urge suppression strategies, timed voids, bladder retraining), physical therapy, medication; for refractory cases posterior tibial nerve stimulation, sacral neuromodulation, and intravesical botulinum toxin injection.  For anticholinergic medications, we discussed the potential side effects of anticholinergics including dry eyes, dry mouth, constipation, cognitive impairment and urinary retention. For Beta-3 agonist medication, we discussed the potential side effect of elevated blood pressure which is more likely to occur in individuals with uncontrolled hypertension. -Rx and samples provided for gemtesa. Avoided mirabegron due to BP 140s/80s. Called office after review of records with recent angioedema. Rx changed to Trospium and called patient to review side effects of anti-cholinergic medications - encouraged weight loss, fluid mgmt, bladder training and kegel exercises with handouts provided For treatment of stress urinary incontinence,  non-surgical options include expectant management, weight loss, physical therapy, as well as a pessary.  Surgical options include a midurethral sling, Burch urethropexy, and transurethral injection of a bulking agent. - discussed association with glucose control, encouraged to optimize glycemic control
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
## 2023-01-31 NOTE — Assessment & Plan Note (Signed)
-   CT angio abd/pelvis w/ and w/o contrast 09/20/20 showed b/l simple adnexal cyst, 5.4 on the L and 4.6 on R unchanged from prior exam - reassess with transvaginal ultrasound - refer to gyn to establish care for routine screening

## 2023-01-31 NOTE — Patient Instructions (Addendum)
We discussed the symptoms of overactive bladder (OAB), which include urinary urgency, urinary frequency, night-time urination, with or without urge incontinence.  We discussed management including behavioral therapy (decreasing bladder irritants by following a bladder diet, urge suppression strategies, timed voids, bladder retraining), physical therapy, medication; and for refractory cases posterior tibial nerve stimulation, sacral neuromodulation, and intravesical botulinum toxin injection.   For night time frequency: - avoid fluid intake after 6pm - elevated your feet during the day or use compression socks to reduce lower extremity swelling - switch your diuretic (e.g. furosemide) dosing to 2pm - continue CPAP for sleep apnea  For treatment of stress urinary incontinence, which is leakage with physical activity/movement/strainging/coughing, we discussed expectant management versus nonsurgical options versus surgery. Nonsurgical options include weight loss, physical therapy, as well as a pessary.  Surgical options include a midurethral sling, which is a synthetic mesh sling that acts like a hammock under the urethra to prevent leakage of urine, a Burch urethropexy, and transurethral injection of a bulking agent.   Constipation: Our goal is to achieve formed bowel movements daily or every-other-day.  You may need to try different combinations of the following options to find what works best for you - everybody's body works differently so feel free to adjust the dosages as needed.  Some options to help maintain bowel health include:  Dietary changes (more leafy greens, vegetables and fruits; less processed foods) Fiber supplementation (Benefiber, FiberCon, Metamucil or Psyllium). Start slow and increase gradually to full dose. Over-the-counter agents such as: stool softeners (Docusate or Colace) and/or laxatives (Miralax, milk of magnesia)  "Power Pudding" is a natural mixture that may help your  constipation.  To make blend 1 cup applesauce, 1 cup wheat bran, and 3/4 cup prune juice, refrigerate and then take 1 tablespoon daily with a large glass of water as needed.   Women should try to eat at least 21 to 25 grams of fiber a day, while men should aim for 30 to 38 grams a day. You can add fiber to your diet with food or a fiber supplement such as psyllium (metamucil), benefiber, or fibercon.   Here's a look at how much dietary fiber is found in some common foods. When buying packaged foods, check the Nutrition Facts label for fiber content. It can vary among brands.  Fruits Serving size Total fiber (grams)*  Raspberries 1 cup 8.0  Pear 1 medium 5.5  Apple, with skin 1 medium 4.5  Banana 1 medium 3.0  Orange 1 medium 3.0  Strawberries 1 cup 3.0   Vegetables Serving size Total fiber (grams)*  Green peas, boiled 1 cup 9.0  Broccoli, boiled 1 cup chopped 5.0  Turnip greens, boiled 1 cup 5.0  Brussels sprouts, boiled 1 cup 4.0  Potato, with skin, baked 1 medium 4.0  Sweet corn, boiled 1 cup 3.5  Cauliflower, raw 1 cup chopped 2.0  Carrot, raw 1 medium 1.5   Grains Serving size Total fiber (grams)*  Spaghetti, whole-wheat, cooked 1 cup 6.0  Barley, pearled, cooked 1 cup 6.0  Bran flakes 3/4 cup 5.5  Quinoa, cooked 1 cup 5.0  Oat bran muffin 1 medium 5.0  Oatmeal, instant, cooked 1 cup 5.0  Popcorn, air-popped 3 cups 3.5  Brown rice, cooked 1 cup 3.5  Bread, whole-wheat 1 slice 2.0  Bread, rye 1 slice 2.0   Legumes, nuts and seeds Serving size Total fiber (grams)*  Split peas, boiled 1 cup 16.0  Lentils, boiled 1 cup 15.5  Black beans,  boiled 1 cup 15.0  Baked beans, canned 1 cup 10.0  Chia seeds 1 ounce 10.0  Almonds 1 ounce (23 nuts) 3.5  Pistachios 1 ounce (49 nuts) 3.0  Sunflower kernels 1 ounce 3.0  *Rounded to nearest 0.5 gram. Source: [employer] for Standard Reference, Legacy Release    We have sent a GYN referral to establish care and  please call radiology to schedule your transvaginal ultrasound due to your history of bilateral ovarian cysts.

## 2023-02-27 ENCOUNTER — Encounter (HOSPITAL_BASED_OUTPATIENT_CLINIC_OR_DEPARTMENT_OTHER): Payer: Self-pay | Admitting: Emergency Medicine

## 2023-02-27 ENCOUNTER — Emergency Department (HOSPITAL_BASED_OUTPATIENT_CLINIC_OR_DEPARTMENT_OTHER): Payer: Medicare Other

## 2023-02-27 ENCOUNTER — Emergency Department (HOSPITAL_BASED_OUTPATIENT_CLINIC_OR_DEPARTMENT_OTHER)
Admission: EM | Admit: 2023-02-27 | Discharge: 2023-02-27 | Disposition: A | Discharge status: Home / Self Care | Payer: Medicare Other | Attending: Emergency Medicine | Admitting: Emergency Medicine

## 2023-02-27 ENCOUNTER — Other Ambulatory Visit: Payer: Self-pay

## 2023-02-27 DIAGNOSIS — I10 Essential (primary) hypertension: Secondary | ICD-10-CM | POA: Diagnosis not present

## 2023-02-27 DIAGNOSIS — Z7901 Long term (current) use of anticoagulants: Secondary | ICD-10-CM | POA: Insufficient documentation

## 2023-02-27 DIAGNOSIS — E119 Type 2 diabetes mellitus without complications: Secondary | ICD-10-CM | POA: Diagnosis not present

## 2023-02-27 DIAGNOSIS — M7989 Other specified soft tissue disorders: Secondary | ICD-10-CM | POA: Diagnosis present

## 2023-02-27 DIAGNOSIS — Z794 Long term (current) use of insulin: Secondary | ICD-10-CM | POA: Insufficient documentation

## 2023-02-27 DIAGNOSIS — Z7984 Long term (current) use of oral hypoglycemic drugs: Secondary | ICD-10-CM | POA: Insufficient documentation

## 2023-02-27 DIAGNOSIS — L03115 Cellulitis of right lower limb: Secondary | ICD-10-CM | POA: Insufficient documentation

## 2023-02-27 DIAGNOSIS — Z79899 Other long term (current) drug therapy: Secondary | ICD-10-CM | POA: Insufficient documentation

## 2023-02-27 DIAGNOSIS — I4891 Unspecified atrial fibrillation: Secondary | ICD-10-CM | POA: Diagnosis not present

## 2023-02-27 LAB — CBC WITH DIFFERENTIAL/PLATELET
Abs Immature Granulocytes: 0.02 10*3/uL (ref 0.00–0.07)
Basophils Absolute: 0.1 10*3/uL (ref 0.0–0.1)
Basophils Relative: 1 %
Eosinophils Absolute: 0.1 10*3/uL (ref 0.0–0.5)
Eosinophils Relative: 2 %
HCT: 43.7 % (ref 36.0–46.0)
Hemoglobin: 13.9 g/dL (ref 12.0–15.0)
Immature Granulocytes: 0 %
Lymphocytes Relative: 17 %
Lymphs Abs: 1.2 10*3/uL (ref 0.7–4.0)
MCH: 29.5 pg (ref 26.0–34.0)
MCHC: 31.8 g/dL (ref 30.0–36.0)
MCV: 92.8 fL (ref 80.0–100.0)
Monocytes Absolute: 0.5 10*3/uL (ref 0.1–1.0)
Monocytes Relative: 8 %
Neutro Abs: 4.9 10*3/uL (ref 1.7–7.7)
Neutrophils Relative %: 72 %
Platelets: 317 10*3/uL (ref 150–400)
RBC: 4.71 MIL/uL (ref 3.87–5.11)
RDW: 16.2 % — ABNORMAL HIGH (ref 11.5–15.5)
WBC: 6.8 10*3/uL (ref 4.0–10.5)
nRBC: 0 % (ref 0.0–0.2)

## 2023-02-27 LAB — I-STAT CHEM 8, ED
BUN: 25 mg/dL — ABNORMAL HIGH (ref 8–23)
Calcium, Ion: 1.04 mmol/L — ABNORMAL LOW (ref 1.15–1.40)
Chloride: 104 mmol/L (ref 98–111)
Creatinine, Ser: 0.9 mg/dL (ref 0.44–1.00)
Glucose, Bld: 229 mg/dL — ABNORMAL HIGH (ref 70–99)
HCT: 45 % (ref 36.0–46.0)
Hemoglobin: 15.3 g/dL — ABNORMAL HIGH (ref 12.0–15.0)
Potassium: 4.1 mmol/L (ref 3.5–5.1)
Sodium: 138 mmol/L (ref 135–145)
TCO2: 25 mmol/L (ref 22–32)

## 2023-02-27 LAB — LACTIC ACID, PLASMA: Lactic Acid, Venous: 1.1 mmol/L (ref 0.5–1.9)

## 2023-02-27 MED ORDER — FLUCONAZOLE 150 MG PO TABS: 150.0000 mg | ORAL_TABLET | Freq: Every day | ORAL | 0 refills | Status: AC

## 2023-02-27 MED ORDER — SODIUM CHLORIDE 0.9 % IV SOLN
2.0000 g | Freq: Once | INTRAVENOUS | Status: AC
  Administered 2023-02-27: 2 g via INTRAVENOUS
  Filled 2023-02-27: qty 20

## 2023-02-27 MED ORDER — CEPHALEXIN 500 MG PO CAPS: 500.0000 mg | ORAL_CAPSULE | Freq: Four times a day (QID) | ORAL | 0 refills | Status: AC

## 2023-02-27 MED ORDER — LACTATED RINGERS IV BOLUS
500.0000 mL | Freq: Once | INTRAVENOUS | Status: AC
  Administered 2023-02-27: 500 mL via INTRAVENOUS

## 2023-02-27 NOTE — ED Triage Notes (Signed)
Recent dx of RLE cellulitis; currently on doxycycline, now with worsening sxs

## 2023-02-27 NOTE — Discharge Instructions (Signed)
You were seen today for skin infection.  You have a mild skin infection of your right leg.  We did not see any signs of blood clot on your ultrasound.  Your lab work today was reassuring.  We are adding on an additional antibiotic for better coverage.  You previously have gotten a yeast infection related to this, I prescribed you a dose of Diflucan to use if you develop any symptoms of yeast infection.  You should continue and finish the entire doxycycline and Keflex course.  If you develop any worsening redness, fever, vomiting, severe pain or swelling you should return to the ED as you may need hospitalization.

## 2023-02-27 NOTE — ED Provider Notes (Signed)
[place] [facility] Provider Note   CSN: [id number] Arrival date & time: 02/27/23  1756     History  Chief Complaint  Patient presents with   Leg Pain    GAIA TETRAULT is a 67 y.o. female.   Leg Pain 67 year old female history of type 2 diabetes, obesity, atrial fibrillation on Xarelto, hypertension, hyperlipidemia, GERD presenting for cellulitis.  Patient states several weeks her she had a fall and injured her right leg.  She developed some swelling.  She saw her primary care provider about a week ago.  She is placed on doxycycline for cellulitis.  She had ultrasound done which she has not had the results of to evaluate for DVT.  Since then she has had continued redness.  The original redness spot got better but now there is a new spot of redness below that.  She is not had any fevers or chills.  Leg is remains swollen but is not worse.  No chest pain or shortness of breath.  No abdominal pain or vomiting.  She is no weakness or numbness.  She is still on doxycycline for cellulitis.     Home Medications Prior to Admission medications   Medication Sig Start Date End Date Taking? Authorizing Provider  cephALEXin (KEFLEX) 500 MG capsule Take 1 capsule (500 mg total) by mouth 4 (four) times daily for 7 days. 02/27/23 03/06/23 Yes Laurence Spates, MD  fluconazole (DIFLUCAN) 150 MG tablet Take 1 tablet (150 mg total) by mouth daily for 1 dose. 02/27/23 02/28/23 Yes Laurence Spates, MD  acetaminophen (TYLENOL) 500 MG tablet Take 1,000 mg by mouth daily as needed for headache.    [provider]  ALPRAZolam Prudy Feeler) 0.25 MG tablet Take 0.25 mg by mouth daily as needed (afib/panic attacks). 08/14/19   [provider]  atorvastatin (LIPITOR) 20 MG tablet Take 20 mg by mouth at bedtime. 03/02/19   [provider]  buPROPion (WELLBUTRIN XL) 150 MG 24 hr tablet Take 150 mg by mouth every morning. 07/21/22   [provider]   cetirizine (ZYRTEC) 10 MG tablet Take 1 tablet (10 mg total) by mouth daily. 01/27/23   Marcelyn Bruins, MD  Cholecalciferol (VITAMIN D-3) 125 MCG (5000 UT) TABS Take 5,000 Units by mouth at bedtime.    [provider]  clotrimazole-betamethasone (LOTRISONE) cream Apply 1 application  topically daily as needed (rash.).    [provider]  diclofenac Sodium (VOLTAREN) 1 % GEL Apply 2-4 g topically daily as needed (pain).    [provider]  diltiazem (CARDIZEM CD) 180 MG 24 hr capsule TAKE 1 CAPSULE(180 MG) BY MOUTH DAILY 08/30/22   Jake Bathe, MD  diltiazem (CARDIZEM) 30 MG tablet Take 1 tablet (30 mg total) by mouth every 6 (six) hours as needed (for elevated heart rates). 04/20/22   Graciella Freer, PA-C  empagliflozin (JARDIANCE) 10 MG TABS tablet Take 10 mg by mouth every morning.    [provider]  famotidine (PEPCID) 20 MG tablet Take 1 tablet (20 mg total) by mouth daily. 01/27/23   Marcelyn Bruins, MD  flecainide (TAMBOCOR) 50 MG tablet TAKE 1 TABLET(50 MG) BY MOUTH TWICE DAILY 12/10/22   Camnitz, Andree Coss, MD  furosemide (LASIX) 20 MG tablet Take 1 tablet (20 mg total) by mouth every morning. 09/29/20   Almon Hercules, MD  HYDROcodone-acetaminophen (NORCO/VICODIN) 5-325 MG tablet Take 1 tablet by mouth every 6 (six) hours as needed  for severe pain. 10/17/22   Pollyann Savoy, MD  Magnesium Oxide 250 MG TABS Take 1 tablet (250 mg total) by mouth daily. 09/25/20   Newman Nip, NP  methylphenidate (RITALIN) 10 MG tablet Take 10 mg by mouth as needed (narcoleptic episodes when driving long distances.).     [provider]  metoprolol succinate (TOPROL-XL) 100 MG 24 hr tablet Take 1 tablet (100 mg total) by mouth daily. 04/20/22   Jake Bathe, MD  metroNIDAZOLE (METROCREAM) 0.75 % cream Apply 1 application topically daily as needed (rosacea).     [provider]  mupirocin ointment (BACTROBAN) 2 % Apply 1  Application topically 3 (three) times daily. 12/23/22   [provider]  omeprazole (PRILOSEC OTC) 20 MG tablet Take 20 mg by mouth at bedtime.    [provider]  OZEMPIC, 0.25 OR 0.5 MG/DOSE, 2 MG/1.5ML SOPN SMARTSIG:0.25 Milligram(s) SUB-Q Once a Week 11/24/20   [provider]  Polyethylene Glycol 400 (BLINK TEARS) 0.25 % SOLN Place 1 drop into both eyes daily as needed (dry eyes).    [provider]  rivaroxaban (XARELTO) 20 MG TABS tablet Take 1 tablet (20 mg total) by mouth daily with supper. 02/23/21   Jake Bathe, MD  rOPINIRole (REQUIP) 2 MG tablet Take 1-2 mg by mouth See admin instructions. Take 1/2 tablet (1 mg) by mouth with supper as needed for restless legs, take 1 tablet (2 mg) daily at bedtime 07/29/20   [provider]  tiZANidine (ZANAFLEX) 2 MG tablet Take 2 mg by mouth 3 (three) times daily as needed. 12/01/22   [provider]  Trospium Chloride 60 MG CP24 Take 1 capsule (60 mg total) by mouth daily. 01/31/23   Loleta Chance, MD  Vilazodone HCl 20 MG TABS Take 20 mg by mouth daily.    [provider]      Allergies    Dilaudid [hydromorphone], Sulfa antibiotics, Dilaudid [hydromorphone hcl], Irbesartan, Keflex [cephalexin], Metformin hcl, Sulindac, and Tramadol    Review of Systems   Review of Systems Review of systems completed and notable as per HPI.  ROS otherwise negative.   Physical Exam Updated Vital Signs BP (!) 142/72   Pulse 69   Temp 98.1 F (36.7 C) (Oral)   Resp 19   Ht 5\' 3"  (1.6 m)   Wt 117.5 kg   SpO2 95%   BMI 45.88 kg/m  Physical Exam Vitals and nursing note reviewed.  Constitutional:      General: She is not in acute distress.    Appearance: She is well-developed.  HENT:     Head: Normocephalic and atraumatic.  Eyes:     Conjunctiva/sclera: Conjunctivae normal.  Cardiovascular:     Rate and Rhythm: Normal rate and regular rhythm.     Pulses: Normal pulses.     Heart sounds:  Normal heart sounds. No murmur heard. Pulmonary:     Effort: Pulmonary effort is normal. No respiratory distress.     Breath sounds: Normal breath sounds.  Abdominal:     Palpations: Abdomen is soft.     Tenderness: There is no abdominal tenderness.  Musculoskeletal:        General: No swelling.     Cervical back: Neck supple.     Right lower leg: Edema present.     Comments: Slight edema 1+ to the right lower extremity.  She has an area of erythema and warmth to the medial aspect of the right leg.  She also has a small area to the lateral aspect of the right leg.  No bony tenderness.  Range of motion without pain.  No joint swelling.  No crepitus.  No circumferential cellulitis.  Skin:    General: Skin is warm and dry.     Capillary Refill: Capillary refill takes less than 2 seconds.  Neurological:     Mental Status: She is alert.  Psychiatric:        Mood and Affect: Mood normal.     ED Results / Procedures / Treatments   Labs (all labs ordered are listed, but only abnormal results are displayed) Labs Reviewed  CBC WITH DIFFERENTIAL/PLATELET - Abnormal; Notable for the following components:      Result Value   RDW 16.2 (*)    All other components within normal limits  I-STAT CHEM 8, ED - Abnormal; Notable for the following components:   BUN 25 (*)    Glucose, Bld 229 (*)    Calcium, Ion 1.04 (*)    Hemoglobin 15.3 (*)    All other components within normal limits  LACTIC ACID, PLASMA    EKG None  Radiology US Venous Img Lower Right (DVT Study)  Result Date: 02/27/2023 CLINICAL DATA:  Right hamstring tear 02/02/2023 with continued right lower calf pain, and reports recent diagnosis of cellulitis. EXAM: RIGHT LOWER EXTREMITY VENOUS DOPPLER ULTRASOUND TECHNIQUE: Gray-scale sonography with compression, as well as color and duplex ultrasound, were performed to evaluate the deep venous system(s) from the level of the common femoral vein through the popliteal and proximal calf  veins. COMPARISON:  None Available. FINDINGS: VENOUS Normal compressibility of the common femoral, superficial femoral, and popliteal veins, with limited visualization of the calf veins due to edema. Right posterior tibial vein was only well seen at the ankle where it was noted patent. Right peroneal vein could not be seen. Visualized portions of profunda femoral vein and great saphenous vein unremarkable. No filling defects to suggest DVT on grayscale or color Doppler imaging. Doppler waveforms show normal direction of venous flow, normal respiratory plasticity and response to augmentation. Limited views of the contralateral common femoral vein are unremarkable. OTHER None. Limitations: none IMPRESSION: No evidence of right lower extremity DVT, but with limited visualization of the calf veins due to edema. Electronically Signed   By: Almira Bar M.D.   On: 02/27/2023 20:10    Procedures Procedures    Medications Ordered in ED Medications  lactated ringers bolus 500 mL (0 mLs Intravenous Stopped 02/27/23 1950)  cefTRIAXone (ROCEPHIN) 2 g in sodium chloride 0.9 % 100 mL IVPB (0 g Intravenous Stopped 02/27/23 2115)    ED Course/ Medical Decision Making/ A&P Clinical Course as of 02/27/23 2155  Sun Feb 27, 2023  2033 Anion gap normal. [JD]  2046 Reviewed patient's labs.  No leukocytosis.  Renal function at baseline.  She is nontachycardic, normotensive, not febrile.  Not tachypneic.  She is well-appearing.  She is very minimal area of cellulitis.  Reportedly has progressed slightly on doxycycline although she more likely has strep infection given nonpurulent cellulitis which doxycycline is not ideal for her.  I had a shared decision-making conversation with her regarding hospitalization versus close outpatient follow-up with addition of better strep coverage.  She was comfortable with this eval antibiotics here and switching antibiotic to add better strep coverage.  She has reported allergy to Keflex  but clarifies that is not an allergy just caused her yeast infection prior.  Will plan to give  her Rocephin here, and I can prescribe her some Diflucan in case she gets any symptoms of a yeast infection.  She is comfortable this plan.  She has a PCP who can follow-up with her in a couple days and I discussed with her that she develops any fever, worsening swelling or redness on antibiotics and she needs to return to the ED if she will need admission.  She is understanding and comfortable this plan. [JD]    Clinical Course User Index [JD] Laurence Spates, MD                                 Medical Decision Making Amount and/or Complexity of Data Reviewed Labs: ordered.  Risk Prescription drug management.   Medical Decision Making:   ELEZABETH YOUMAN is a 67 y.o. female who presented to the ED today with right lower extremity cellulitis.  Vital signs reviewed.  Patient is well-appearing, afebrile.  Not clinically septic.  She has a small area of cellulitis on her right leg.  She reports that she is been on doxycycline for about a week and the initial spot of redness is gone away but now she has a new spot.  It does look like cellulitis.  She is no crepitus or signs of systemic illness, low suspicion for abscess or necrotizing infection.  She has a mild associated swelling, likely reactive/with her recent trauma in her life will obtain DVT study to rule out.  Her lab work is reassuring.  No leukocytosis, lactic acid is normal.  I do not think she is systemically ill.  She is been on doxycycline due to Keflex intolerance which is caused yeast infection.  I have low suspicion for staph infection given nonpurulent cellulitis and she may just be not having good strep coverage with doxycycline.  Anion gap is normal calculated.   Patient placed on continuous vitals and telemetry monitoring while in ED which was reviewed periodically.  Reviewed and confirmed nursing documentation for past medical history,  family history, social history.  Reassessment and Plan:   Reviewed patient's labs.  No leukocytosis.  Renal function at baseline.  She is nontachycardic, normotensive, not febrile.  Not tachypneic.  She is well-appearing.  She is very minimal area of cellulitis.  Reportedly has progressed slightly on doxycycline although she more likely has strep infection given nonpurulent cellulitis which doxycycline is not ideal for her.  I had a shared decision-making conversation with her regarding hospitalization versus close outpatient follow-up with addition of better strep coverage.  She was comfortable with IV antibiotics here and switching antibiotic to add better strep coverage.  She has reported allergy to Keflex but clarifies that is not an allergy just caused her yeast infection prior.  Will plan to give her Rocephin here, and I can prescribe her some Diflucan in case she gets any symptoms of a yeast infection.  She is comfortable this plan.  She has a PCP who can follow-up with her in a couple days and I discussed with her that she develops any fever, worsening swelling or redness on antibiotics and she needs to return to the ED if she will need admission.  She is understanding and comfortable this plan.   Patient's presentation is most consistent with acute complicated illness / injury requiring diagnostic workup.           Final Clinical Impression(s) / ED Diagnoses Final diagnoses:  Cellulitis of  right lower extremity    Rx / DC Orders ED Discharge Orders          Ordered    fluconazole (DIFLUCAN) 150 MG tablet  Daily        02/27/23 2049    cephALEXin (KEFLEX) 500 MG capsule  4 times daily        02/27/23 2049              Laurence Spates, MD 02/27/23 2155

## 2023-04-18 ENCOUNTER — Ambulatory Visit: Payer: Medicare Other | Admitting: Obstetrics

## 2023-04-18 NOTE — Progress Notes (Deleted)
Kristin [name] Return Visit  SUBJECTIVE  History of Present Illness: Kristin [name] is a 68 y.o. female seen in follow-up for mixed urinary incontinence, constipation, nocturia, ovarian cyst. Plan at last visit was fiber supplementation, Trospium.   Gemtesa*** with history of angioedema Avoid mirabegron due to HTN Continue CPAP for sleep apnea, fluid management and afternoon dosing of diuretic use TVUS ordered with referral to gyn***  ED evaluation 02/27/23 for LE cellulitis after fall with R leg injury. Rx diflucan and Keflex.  Past Medical History: Patient  has a past medical history of ADD (attention deficit disorder), Bilateral ovarian cysts, Bladder spasm, Chronic cough (DRY COUGH), Complication of anesthesia, Depression with anxiety, Detrusor instability of bladder, Diabetic peripheral neuropathy (HCC), GERD (gastroesophageal reflux disease), Hernia, abdominal, History of concussion, History of kidney stones, History of migraine, Hyperlipidemia, Hypertension, Incontinent of urine, LBBB (left bundle branch block), Left ureteral calculus, Lumbar herniated disc, Microcytic anemia, Mild mitral regurgitation (03/2018), Morbid obesity (HCC), OSA on CPAP, Paroxysmal atrial fibrillation (HCC) (12/02/2012), SUI (stress urinary incontinence, female), Type 2 diabetes mellitus (HCC), and Urticaria.   Past Surgical History: She  has a past surgical history that includes Rectovaginal fistula closure (1990); Tonsillectomy (1963); Laparoscopic cholecystectomy (01/14/2005); Extracorporeal shock wave lithotripsy (Bilateral, left 01-03-2014/  right 08-28-2013); Rectovaginal fistula closure (1986); Cardiovascular stress test (01-17-2013  dr Patty Sermons); transthoracic echocardiogram (12/03/2012); REPAIR RIGHT FEMORAL FX WITH BONE GRAFT (age 23); Knee arthroscopy w/ debridement (Bilateral); Cystoscopy with stent placement (Left, 01/30/2014); Cystoscopy with retrograde pyelogram, ureteroscopy and stent  placement (Left, 02/07/2014); Holmium laser application (N/A, 02/07/2014); and Cataract extraction, bilateral (2019).   Medications: She has a current medication list which includes the following prescription(s): acetaminophen, alprazolam, atorvastatin, bupropion, cetirizine, vitamin d-3, clotrimazole-betamethasone, diclofenac sodium, diltiazem, diltiazem, empagliflozin, famotidine, flecainide, furosemide, hydrocodone-acetaminophen, magnesium oxide, methylphenidate, metoprolol succinate, metronidazole, mupirocin ointment, omeprazole, ozempic (0.25 or 0.5 mg/dose), blink tears, rivaroxaban, ropinirole, tizanidine, trospium chloride, and vilazodone hcl.   Allergies: Patient is allergic to dilaudid [hydromorphone], sulfa antibiotics, dilaudid [hydromorphone hcl], irbesartan, keflex [cephalexin], metformin hcl, sulindac, and tramadol.   Social History: Patient  reports that she has never smoked. She has been exposed to tobacco smoke. She has never used smokeless tobacco. She reports current alcohol use of about 4.0 - 6.0 standard drinks of alcohol per week. She reports that she does not use drugs.     OBJECTIVE     Physical Exam: There were no vitals filed for this visit. Gen: No apparent distress, A&O x 3.  Detailed Urogynecologic Evaluation:  Deferred. Prior exam showed:      No data to display             ASSESSMENT AND PLAN    Kristin [name] is a 68 y.o. with:  No diagnosis found.  There are no diagnoses linked to this encounter.   Loleta Chance, MD

## 2023-04-25 ENCOUNTER — Emergency Department (HOSPITAL_COMMUNITY): Payer: Medicare Other

## 2023-04-25 ENCOUNTER — Observation Stay (HOSPITAL_BASED_OUTPATIENT_CLINIC_OR_DEPARTMENT_OTHER): Payer: Medicare Other

## 2023-04-25 ENCOUNTER — Encounter (HOSPITAL_COMMUNITY): Payer: Self-pay | Admitting: Internal Medicine

## 2023-04-25 ENCOUNTER — Observation Stay (HOSPITAL_COMMUNITY)
Admission: EM | Admit: 2023-04-25 | Discharge: 2023-04-26 | Disposition: A | Discharge status: Home / Self Care | Payer: Medicare Other | Attending: Internal Medicine | Admitting: Internal Medicine

## 2023-04-25 ENCOUNTER — Other Ambulatory Visit: Payer: Self-pay

## 2023-04-25 DIAGNOSIS — Z6841 Body Mass Index (BMI) 40.0 and over, adult: Secondary | ICD-10-CM | POA: Diagnosis not present

## 2023-04-25 DIAGNOSIS — R197 Diarrhea, unspecified: Secondary | ICD-10-CM | POA: Insufficient documentation

## 2023-04-25 DIAGNOSIS — E785 Hyperlipidemia, unspecified: Secondary | ICD-10-CM | POA: Diagnosis present

## 2023-04-25 DIAGNOSIS — E876 Hypokalemia: Secondary | ICD-10-CM | POA: Diagnosis not present

## 2023-04-25 DIAGNOSIS — I4589 Other specified conduction disorders: Secondary | ICD-10-CM | POA: Diagnosis not present

## 2023-04-25 DIAGNOSIS — F32A Depression, unspecified: Secondary | ICD-10-CM | POA: Insufficient documentation

## 2023-04-25 DIAGNOSIS — E1140 Type 2 diabetes mellitus with diabetic neuropathy, unspecified: Secondary | ICD-10-CM | POA: Diagnosis not present

## 2023-04-25 DIAGNOSIS — I959 Hypotension, unspecified: Secondary | ICD-10-CM | POA: Diagnosis present

## 2023-04-25 DIAGNOSIS — D500 Iron deficiency anemia secondary to blood loss (chronic): Secondary | ICD-10-CM | POA: Insufficient documentation

## 2023-04-25 DIAGNOSIS — R9431 Abnormal electrocardiogram [ECG] [EKG]: Secondary | ICD-10-CM | POA: Diagnosis present

## 2023-04-25 DIAGNOSIS — Z7984 Long term (current) use of oral hypoglycemic drugs: Secondary | ICD-10-CM | POA: Diagnosis not present

## 2023-04-25 DIAGNOSIS — D751 Secondary polycythemia: Secondary | ICD-10-CM | POA: Diagnosis present

## 2023-04-25 DIAGNOSIS — E878 Other disorders of electrolyte and fluid balance, not elsewhere classified: Principal | ICD-10-CM | POA: Insufficient documentation

## 2023-04-25 DIAGNOSIS — Z7901 Long term (current) use of anticoagulants: Secondary | ICD-10-CM | POA: Insufficient documentation

## 2023-04-25 DIAGNOSIS — I480 Paroxysmal atrial fibrillation: Principal | ICD-10-CM | POA: Insufficient documentation

## 2023-04-25 DIAGNOSIS — R079 Chest pain, unspecified: Secondary | ICD-10-CM | POA: Diagnosis not present

## 2023-04-25 DIAGNOSIS — D750 Familial erythrocytosis: Secondary | ICD-10-CM | POA: Insufficient documentation

## 2023-04-25 DIAGNOSIS — F419 Anxiety disorder, unspecified: Secondary | ICD-10-CM | POA: Insufficient documentation

## 2023-04-25 DIAGNOSIS — N200 Calculus of kidney: Secondary | ICD-10-CM

## 2023-04-25 DIAGNOSIS — I4891 Unspecified atrial fibrillation: Secondary | ICD-10-CM

## 2023-04-25 DIAGNOSIS — E1165 Type 2 diabetes mellitus with hyperglycemia: Secondary | ICD-10-CM | POA: Insufficient documentation

## 2023-04-25 DIAGNOSIS — G4733 Obstructive sleep apnea (adult) (pediatric): Secondary | ICD-10-CM | POA: Insufficient documentation

## 2023-04-25 DIAGNOSIS — I9589 Other hypotension: Secondary | ICD-10-CM | POA: Diagnosis not present

## 2023-04-25 DIAGNOSIS — E119 Type 2 diabetes mellitus without complications: Secondary | ICD-10-CM

## 2023-04-25 DIAGNOSIS — R002 Palpitations: Secondary | ICD-10-CM | POA: Diagnosis present

## 2023-04-25 LAB — BASIC METABOLIC PANEL
Anion gap: 7 (ref 5–15)
Anion gap: 8 (ref 5–15)
BUN: 12 mg/dL (ref 8–23)
BUN: 13 mg/dL (ref 8–23)
CO2: 16 mmol/L — ABNORMAL LOW (ref 22–32)
CO2: 25 mmol/L (ref 22–32)
Calcium: 5.7 mg/dL — CL (ref 8.9–10.3)
Calcium: 9.5 mg/dL (ref 8.9–10.3)
Chloride: 116 mmol/L — ABNORMAL HIGH (ref 98–111)
Chloride: 105 mmol/L (ref 98–111)
Creatinine, Ser: 0.62 mg/dL (ref 0.44–1.00)
Creatinine, Ser: 0.85 mg/dL (ref 0.44–1.00)
GFR, Estimated: >60 mL/min (ref >60)
GFR, Estimated: >60 mL/min (ref >60)
Glucose, Bld: 265 mg/dL — ABNORMAL HIGH (ref 70–99)
Glucose, Bld: 205 mg/dL — ABNORMAL HIGH (ref 70–99)
Potassium: 2.3 mmol/L — CL (ref 3.5–5.1)
Potassium: 4.5 mmol/L (ref 3.5–5.1)
Sodium: 139 mmol/L (ref 135–145)
Sodium: 138 mmol/L (ref 135–145)

## 2023-04-25 LAB — CBG MONITORING, ED
Glucose-Capillary: 253 mg/dL — ABNORMAL HIGH (ref 70–99)
Glucose-Capillary: 199 mg/dL — ABNORMAL HIGH (ref 70–99)
Glucose-Capillary: 246 mg/dL — ABNORMAL HIGH (ref 70–99)

## 2023-04-25 LAB — CBC WITH DIFFERENTIAL/PLATELET
Abs Immature Granulocytes: 0.04 10*3/uL (ref 0.00–0.07)
Basophils Absolute: 0.1 10*3/uL (ref 0.0–0.1)
Basophils Relative: 1 %
Eosinophils Absolute: 0.1 10*3/uL (ref 0.0–0.5)
Eosinophils Relative: 1 %
HCT: 52.4 % — ABNORMAL HIGH (ref 36.0–46.0)
Hemoglobin: 16.5 g/dL — ABNORMAL HIGH (ref 12.0–15.0)
Immature Granulocytes: 0 %
Lymphocytes Relative: 11 %
Lymphs Abs: 1.1 10*3/uL (ref 0.7–4.0)
MCH: 28.1 pg (ref 26.0–34.0)
MCHC: 31.5 g/dL (ref 30.0–36.0)
MCV: 89.1 fL (ref 80.0–100.0)
Monocytes Absolute: 0.6 10*3/uL (ref 0.1–1.0)
Monocytes Relative: 6 %
Neutro Abs: 8.1 10*3/uL — ABNORMAL HIGH (ref 1.7–7.7)
Neutrophils Relative %: 81 %
Platelets: 260 10*3/uL (ref 150–400)
RBC: 5.88 MIL/uL — ABNORMAL HIGH (ref 3.87–5.11)
RDW: 14.2 % (ref 11.5–15.5)
WBC: 9.9 10*3/uL (ref 4.0–10.5)
nRBC: 0 % (ref 0.0–0.2)

## 2023-04-25 LAB — ECHOCARDIOGRAM COMPLETE
AR max vel: 3.43 cm2
AV Area VTI: 3.57 cm2
AV Area mean vel: 3.24 cm2
AV Mean grad: 1 mm[Hg]
AV Peak grad: 2.7 mm[Hg]
Ao pk vel: 0.81 m/s
Area-P 1/2: 3.53 cm2
Calc EF: 49 %
S' Lateral: 3.1 cm
Single Plane A2C EF: 51.6 %
Single Plane A4C EF: 48.7 %

## 2023-04-25 LAB — MAGNESIUM: Magnesium: 1 mg/dL — ABNORMAL LOW (ref 1.7–2.4)

## 2023-04-25 LAB — TSH: TSH: 0.722 u[IU]/mL (ref 0.350–4.500)

## 2023-04-25 LAB — TROPONIN I (HIGH SENSITIVITY): Troponin I (High Sensitivity): 23 ng/L — ABNORMAL HIGH (ref <18)

## 2023-04-25 MED ORDER — POTASSIUM CHLORIDE CRYS ER 20 MEQ PO TBCR
40.0000 meq | EXTENDED_RELEASE_TABLET | Freq: Once | ORAL | Status: AC
  Administered 2023-04-25: 40 meq via ORAL
  Filled 2023-04-25: qty 2

## 2023-04-25 MED ORDER — BUPROPION HCL ER (XL) 150 MG PO TB24
450.0000 mg | ORAL_TABLET | Freq: Every day | ORAL | Status: DC
Start: 2023-04-25 — End: 2023-04-26
  Administered 2023-04-25 – 2023-04-26 (×2): 450 mg via ORAL
  Filled 2023-04-25 (×2): qty 3

## 2023-04-25 MED ORDER — ALBUTEROL SULFATE (2.5 MG/3ML) 0.083% IN NEBU: 2.5000 mg | INHALATION_SOLUTION | Freq: Four times a day (QID) | RESPIRATORY_TRACT | Status: DC | PRN

## 2023-04-25 MED ORDER — FLECAINIDE ACETATE 50 MG PO TABS
50.0000 mg | ORAL_TABLET | Freq: Two times a day (BID) | ORAL | Status: DC
  Administered 2023-04-25 – 2023-04-26 (×2): 50 mg via ORAL
  Filled 2023-04-25 (×3): qty 1

## 2023-04-25 MED ORDER — ACETAMINOPHEN 325 MG PO TABS
650.0000 mg | ORAL_TABLET | Freq: Four times a day (QID) | ORAL | Status: DC | PRN
Start: 2023-04-25 — End: 2023-04-26

## 2023-04-25 MED ORDER — DICLOFENAC SODIUM 1 % EX GEL: 2.0000 g | Freq: Every day | CUTANEOUS | Status: DC | PRN

## 2023-04-25 MED ORDER — INSULIN ASPART 100 UNIT/ML IJ SOLN
0.0000 [IU] | Freq: Three times a day (TID) | INTRAMUSCULAR | Status: DC
  Administered 2023-04-25: 2 [IU] via SUBCUTANEOUS
  Administered 2023-04-25: 5 [IU] via SUBCUTANEOUS
  Administered 2023-04-26 (×2): 2 [IU] via SUBCUTANEOUS

## 2023-04-25 MED ORDER — EMPAGLIFLOZIN 25 MG PO TABS
25.0000 mg | ORAL_TABLET | Freq: Every day | ORAL | Status: DC
  Administered 2023-04-25 – 2023-04-26 (×2): 25 mg via ORAL
  Filled 2023-04-25 (×2): qty 1

## 2023-04-25 MED ORDER — FESOTERODINE FUMARATE ER 4 MG PO TB24
4.0000 mg | ORAL_TABLET | Freq: Every day | ORAL | Status: DC
  Administered 2023-04-26: 4 mg via ORAL
  Filled 2023-04-25: qty 1

## 2023-04-25 MED ORDER — METOPROLOL SUCCINATE ER 25 MG PO TB24
100.0000 mg | ORAL_TABLET | Freq: Every day | ORAL | Status: DC
  Administered 2023-04-26: 100 mg via ORAL
  Filled 2023-04-25: qty 4

## 2023-04-25 MED ORDER — MAGNESIUM SULFATE 2 GM/50ML IV SOLN
2.0000 g | Freq: Once | INTRAVENOUS | Status: AC
  Administered 2023-04-25: 2 g via INTRAVENOUS
  Filled 2023-04-25: qty 50

## 2023-04-25 MED ORDER — SODIUM CHLORIDE 0.9% FLUSH
3.0000 mL | Freq: Two times a day (BID) | INTRAVENOUS | Status: DC
  Administered 2023-04-25 – 2023-04-26 (×2): 3 mL via INTRAVENOUS

## 2023-04-25 MED ORDER — CALCIUM GLUCONATE-NACL 1-0.675 GM/50ML-% IV SOLN
1.0000 g | Freq: Once | INTRAVENOUS | Status: AC
  Administered 2023-04-25: 1000 mg via INTRAVENOUS
  Filled 2023-04-25: qty 50

## 2023-04-25 MED ORDER — MAGNESIUM SULFATE 2 GM/50ML IV SOLN
2.0000 g | Freq: Once | INTRAVENOUS | Status: AC
Start: 2023-04-25 — End: 2023-04-25
  Administered 2023-04-25: 2 g via INTRAVENOUS
  Filled 2023-04-25: qty 50

## 2023-04-25 MED ORDER — METOPROLOL TARTRATE 5 MG/5ML IV SOLN
2.5000 mg | Freq: Once | INTRAVENOUS | Status: AC
  Administered 2023-04-25: 2.5 mg via INTRAVENOUS
  Filled 2023-04-25: qty 5

## 2023-04-25 MED ORDER — OMEPRAZOLE 20 MG PO CPDR
20.0000 mg | DELAYED_RELEASE_CAPSULE | Freq: Every day | ORAL | Status: DC
  Administered 2023-04-25: 20 mg via ORAL
  Filled 2023-04-25 (×2): qty 1

## 2023-04-25 MED ORDER — RIVAROXABAN 20 MG PO TABS
20.0000 mg | ORAL_TABLET | Freq: Every day | ORAL | Status: DC
  Administered 2023-04-25: 20 mg via ORAL
  Filled 2023-04-25: qty 2

## 2023-04-25 MED ORDER — LORATADINE 10 MG PO TABS
10.0000 mg | ORAL_TABLET | Freq: Every day | ORAL | Status: DC
  Administered 2023-04-25 – 2023-04-26 (×2): 10 mg via ORAL
  Filled 2023-04-25 (×2): qty 1

## 2023-04-25 MED ORDER — VILAZODONE HCL 20 MG PO TABS
20.0000 mg | ORAL_TABLET | Freq: Every day | ORAL | Status: DC
  Administered 2023-04-25 – 2023-04-26 (×2): 20 mg via ORAL
  Filled 2023-04-25 (×2): qty 1

## 2023-04-25 MED ORDER — SODIUM CHLORIDE 0.9 % IV SOLN
4.0000 g | Freq: Once | INTRAVENOUS | Status: AC
  Administered 2023-04-25: 4 g via INTRAVENOUS
  Filled 2023-04-25: qty 40

## 2023-04-25 MED ORDER — SALINE SPRAY 0.65 % NA SOLN
1.0000 | NASAL | Status: DC | PRN
  Filled 2023-04-25: qty 44

## 2023-04-25 MED ORDER — FLECAINIDE ACETATE 50 MG PO TABS
200.0000 mg | ORAL_TABLET | Freq: Once | ORAL | Status: AC
  Administered 2023-04-25: 200 mg via ORAL
  Filled 2023-04-25: qty 4

## 2023-04-25 MED ORDER — POLYVINYL ALCOHOL 1.4 % OP SOLN: 1.0000 [drp] | Freq: Every day | OPHTHALMIC | Status: DC | PRN

## 2023-04-25 MED ORDER — ROPINIROLE HCL 1 MG PO TABS
2.0000 mg | ORAL_TABLET | Freq: Every day | ORAL | Status: DC
  Administered 2023-04-25: 2 mg via ORAL
  Filled 2023-04-25: qty 2

## 2023-04-25 MED ORDER — INSULIN ASPART 100 UNIT/ML IJ SOLN
0.0000 [IU] | Freq: Every day | INTRAMUSCULAR | Status: DC
  Administered 2023-04-25: 2 [IU] via SUBCUTANEOUS

## 2023-04-25 MED ORDER — ATORVASTATIN CALCIUM 10 MG PO TABS
20.0000 mg | ORAL_TABLET | Freq: Every day | ORAL | Status: DC
  Administered 2023-04-25: 20 mg via ORAL
  Filled 2023-04-25: qty 2

## 2023-04-25 MED ORDER — POTASSIUM CHLORIDE 10 MEQ/100ML IV SOLN
10.0000 meq | INTRAVENOUS | Status: AC
  Administered 2023-04-25 (×5): 10 meq via INTRAVENOUS
  Filled 2023-04-25 (×5): qty 100

## 2023-04-25 MED ORDER — ACETAMINOPHEN 650 MG RE SUPP: 650.0000 mg | Freq: Four times a day (QID) | RECTAL | Status: DC | PRN

## 2023-04-25 MED ORDER — TRIMETHOBENZAMIDE HCL 100 MG/ML IM SOLN: 200.0000 mg | Freq: Four times a day (QID) | INTRAMUSCULAR | Status: DC | PRN
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
## 2023-04-25 NOTE — ED Provider Notes (Signed)
[facility] EMERGENCY DEPARTMENT AT [facility] Provider Note   CSN: [id number] Arrival date & time: 04/25/23  0518     History  Chief Complaint  Patient presents with   Atrial Fibrillation    Kristin [name] is a 68 y.o. female.   Atrial Fibrillation  She has history of hypertension, diabetes, hyperlipidemia, paroxysmal atrial fibrillation anticoagulated on rivaroxaban and states that she feels like she has been in atrial fibrillation all night.  She initially felt some discomfort in her throat and then is noted that her heart was racing.  She denies dyspnea, nausea, diaphoresis.  She states that she has been falling asleep and has not taken her nighttime medications for the last several days, and that has included flecainide.  She did take a dose of flecainide tonight.  She takes her diltiazem and metoprolol in the morning, and has been compliant with those.   Home Medications Prior to Admission medications   Medication Sig Start Date End Date Taking? Authorizing Provider  acetaminophen (TYLENOL) 500 MG tablet Take 1,000 mg by mouth daily as needed for headache.    [provider]  ALPRAZolam Prudy Feeler) 0.25 MG tablet Take 0.25 mg by mouth daily as needed (afib/panic attacks). 08/14/19   [provider]  atorvastatin (LIPITOR) 20 MG tablet Take 20 mg by mouth at bedtime. 03/02/19   [provider]  buPROPion (WELLBUTRIN XL) 150 MG 24 hr tablet Take 150 mg by mouth every morning. 07/21/22   [provider]  cetirizine (ZYRTEC) 10 MG tablet Take 1 tablet (10 mg total) by mouth daily. 01/27/23   Marcelyn Bruins, MD  Cholecalciferol (VITAMIN D-3) 125 MCG (5000 UT) TABS Take 5,000 Units by mouth at bedtime.    [provider]  clotrimazole-betamethasone (LOTRISONE) cream Apply 1 application  topically daily as needed (rash.).    [provider]  diclofenac Sodium (VOLTAREN) 1 % GEL Apply 2-4 g topically daily as needed  (pain).    [provider]  diltiazem (CARDIZEM CD) 180 MG 24 hr capsule TAKE 1 CAPSULE(180 MG) BY MOUTH DAILY 08/30/22   Jake Bathe, MD  diltiazem (CARDIZEM) 30 MG tablet Take 1 tablet (30 mg total) by mouth every 6 (six) hours as needed (for elevated heart rates). 04/20/22   Graciella Freer, PA-C  empagliflozin (JARDIANCE) 10 MG TABS tablet Take 10 mg by mouth every morning.    [provider]  famotidine (PEPCID) 20 MG tablet Take 1 tablet (20 mg total) by mouth daily. 01/27/23   Marcelyn Bruins, MD  flecainide (TAMBOCOR) 50 MG tablet TAKE 1 TABLET(50 MG) BY MOUTH TWICE DAILY 12/10/22   Camnitz, Andree Coss, MD  furosemide (LASIX) 20 MG tablet Take 1 tablet (20 mg total) by mouth every morning. 09/29/20   Almon Hercules, MD  HYDROcodone-acetaminophen (NORCO/VICODIN) 5-325 MG tablet Take 1 tablet by mouth every 6 (six) hours as needed for severe pain. 10/17/22   Pollyann Savoy, MD  Magnesium Oxide 250 MG TABS Take 1 tablet (250 mg total) by mouth daily. 09/25/20   Newman Nip, NP  methylphenidate (RITALIN) 10 MG tablet Take 10 mg by mouth as needed (narcoleptic episodes when driving long distances.).     [provider]  metoprolol succinate (TOPROL-XL) 100 MG 24 hr tablet Take 1 tablet (100 mg total) by mouth daily. 04/20/22   Jake Bathe, MD  metroNIDAZOLE (METROCREAM) 0.75 % cream Apply 1 application topically daily as needed (rosacea).  [provider]  mupirocin ointment (BACTROBAN) 2 % Apply 1 Application topically 3 (three) times daily. 12/23/22   [provider]  omeprazole (PRILOSEC OTC) 20 MG tablet Take 20 mg by mouth at bedtime.    [provider]  OZEMPIC, 0.25 OR 0.5 MG/DOSE, 2 MG/1.5ML SOPN SMARTSIG:0.25 Milligram(s) SUB-Q Once a Week 11/24/20   [provider]  Polyethylene Glycol 400 (BLINK TEARS) 0.25 % SOLN Place 1 drop into both eyes daily as needed (dry eyes).    [provider]   rivaroxaban (XARELTO) 20 MG TABS tablet Take 1 tablet (20 mg total) by mouth daily with supper. 02/23/21   Jake Bathe, MD  rOPINIRole (REQUIP) 2 MG tablet Take 1-2 mg by mouth See admin instructions. Take 1/2 tablet (1 mg) by mouth with supper as needed for restless legs, take 1 tablet (2 mg) daily at bedtime 07/29/20   [provider]  tiZANidine (ZANAFLEX) 2 MG tablet Take 2 mg by mouth 3 (three) times daily as needed. 12/01/22   [provider]  Trospium Chloride 60 MG CP24 Take 1 capsule (60 mg total) by mouth daily. 01/31/23   Loleta Chance, MD  Vilazodone HCl 20 MG TABS Take 20 mg by mouth daily.    [provider]      Allergies    Dilaudid [hydromorphone], Sulfa antibiotics, Dilaudid [hydromorphone hcl], Irbesartan, Keflex [cephalexin], Metformin hcl, Sulindac, and Tramadol    Review of Systems   Review of Systems  Physical Exam Updated Vital Signs BP 113/75 (BP Location: Right Arm)   Pulse (!) 116   Temp 98.5 F (36.9 C) (Oral)   SpO2 99%  Physical Exam  ED Results / Procedures / Treatments   Labs (all labs ordered are listed, but only abnormal results are displayed) Labs Reviewed - No data to display  EKG EKG Interpretation Date/Time:  Monday April 25 2023 05:20:31 EST Ventricular Rate:  120 PR Interval:  117 QRS Duration:  142 QT Interval:  371 QTC Calculation: 525 R Axis:   268  Text Interpretation: Sinus tachycardia Left bundle branch block When compared with ECG of 7/21,2024, HEART RATE has increased Confirmed by Dione Booze ([id number]) on 04/25/2023 5:39:23 AM  Radiology No results found.  Procedures Procedures  Cardiac monitor shows rhythms alternating between sinus tachycardia and atrial fibrillation with rapid ventricular response.  Medications Ordered in ED Medications  flecainide (TAMBOCOR) tablet 200 mg (has no administration in time range)    ED Course/ Medical Decision Making/ A&P                                  Medical Decision Making Amount and/or Complexity of Data Reviewed Labs: ordered. Radiology: ordered.  Risk Prescription drug management. Decision regarding hospitalization.   Paroxysmal atrial fibrillation.  Based on my watching her monitor, she is going in and out of atrial fibrillation and 1 out of 80 pretrip fibrillation is in a sinus tachycardia with a rate of about 120.  I have reviewed her past records, and she has numerous ED visits for paroxysmal atrial fibrillation-most recently 09/17/2020.  However, she has not had any episodes of cardioversion.  Since her rhythm does not appear stable, I do not feel she is a good candidate for cardioversion today.  I suspect that she is subtherapeutic on her flecainide, I have ordered an initial dose of flecainide.  I have reviewed her electrocardiogram, and my interpretation  is sinus tachycardia, left bundle branch block.  Compared with most recent ECG, heart rate is increased but QRS morphology is unchanged.  I have ordered screening labs of CBC, basic metabolic panel, magnesium and I have ordered a chest x-ray.  I have reevaluated the patient, heart rhythm seems to stabilized to sinus tachycardia but at a slower rate than it had been previously.  Chest x-ray shows no acute cardiopulmonary process.  Have independently viewed the image, and agree with the radiologist's interpretation.  I have reviewed her laboratory tests, and my interpretation is severe hypokalemia, metabolic acidosis with normal anion gap, severe hypocalcemia, severe hypomagnesemia, elevated hemoglobin consistent with possible volume depletion.  I have ordered oral potassium, intravenous potassium, intravenous calcium, intravenous magnesium.  She will need to be admitted for correction of electrolytes as well as investigation as to why she developed such severe electrolyte abnormalities.  Most likely reason is diuretic use.  I have discussed the case with Dr. Katrinka Blazing of [facility],  who agrees to admit the patient.  Cardiology has been paged, but I have not spoken with them as of the time of this dictation.  CRITICAL CARE Performed by: Dione Booze Total critical care time: 85 minutes Critical care time was exclusive of separately billable procedures and treating other patients. Critical care was necessary to treat or prevent imminent or life-threatening deterioration. Critical care was time spent personally by me on the following activities: development of treatment plan with patient and/or surrogate as well as nursing, discussions with consultants, evaluation of patient's response to treatment, examination of patient, obtaining history from patient or surrogate, ordering and performing treatments and interventions, ordering and review of laboratory studies, ordering and review of radiographic studies, pulse oximetry and re-evaluation of patient's condition.  Final Clinical Impression(s) / ED Diagnoses Final diagnoses:  Paroxysmal atrial fibrillation (HCC)  Hypokalemia  Hypomagnesemia  Hypocalcemia  Polycythemia    Rx / DC Orders ED Discharge Orders     None         Dione Booze, MD 04/25/23 [phone]

## 2023-04-25 NOTE — ED Triage Notes (Signed)
Pt bibgcems from home c/o her heart racing. Pt has hx of afib RVR and called ems because  she was familiar of symptoms. She pt took 60 mg of Cardizem and it was infective.   Bp 90/52 Hr 150 94% ra    ns with ems

## 2023-04-25 NOTE — ED Notes (Signed)
Pt at bedside eating luncg

## 2023-04-26 DIAGNOSIS — E878 Other disorders of electrolyte and fluid balance, not elsewhere classified: Secondary | ICD-10-CM | POA: Diagnosis not present

## 2023-04-26 LAB — MAGNESIUM: Magnesium: 2 mg/dL (ref 1.7–2.4)

## 2023-04-26 LAB — COMPREHENSIVE METABOLIC PANEL
ALT: 27 U/L (ref 0–44)
AST: 19 U/L (ref 15–41)
Albumin: 3.3 g/dL — ABNORMAL LOW (ref 3.5–5.0)
Alkaline Phosphatase: 67 U/L (ref 38–126)
Anion gap: 9 (ref 5–15)
BUN: 16 mg/dL (ref 8–23)
CO2: 26 mmol/L (ref 22–32)
Calcium: 9.4 mg/dL (ref 8.9–10.3)
Chloride: 106 mmol/L (ref 98–111)
Creatinine, Ser: 0.84 mg/dL (ref 0.44–1.00)
GFR, Estimated: >60 mL/min (ref >60)
Glucose, Bld: 204 mg/dL — ABNORMAL HIGH (ref 70–99)
Potassium: 4.1 mmol/L (ref 3.5–5.1)
Sodium: 141 mmol/L (ref 135–145)
Total Bilirubin: 0.7 mg/dL (ref 0.0–1.2)
Total Protein: 6.1 g/dL — ABNORMAL LOW (ref 6.5–8.1)

## 2023-04-26 LAB — CBC
HCT: 45.8 % (ref 36.0–46.0)
Hemoglobin: 14.8 g/dL (ref 12.0–15.0)
MCH: 28.4 pg (ref 26.0–34.0)
MCHC: 32.3 g/dL (ref 30.0–36.0)
MCV: 87.9 fL (ref 80.0–100.0)
Platelets: 267 10*3/uL (ref 150–400)
RBC: 5.21 MIL/uL — ABNORMAL HIGH (ref 3.87–5.11)
RDW: 14.6 % (ref 11.5–15.5)
WBC: 7.2 10*3/uL (ref 4.0–10.5)
nRBC: 0 % (ref 0.0–0.2)

## 2023-04-26 LAB — HEMOGLOBIN A1C
Hgb A1c MFr Bld: 9.6 % — ABNORMAL HIGH (ref 4.8–5.6)
Mean Plasma Glucose: 228.82 mg/dL

## 2023-04-26 LAB — GLUCOSE, CAPILLARY
Glucose-Capillary: 169 mg/dL — ABNORMAL HIGH (ref 70–99)
Glucose-Capillary: 199 mg/dL — ABNORMAL HIGH (ref 70–99)
Glucose-Capillary: 204 mg/dL — ABNORMAL HIGH (ref 70–99)

## 2023-04-26 LAB — HIV ANTIBODY (ROUTINE TESTING W REFLEX): HIV Screen 4th Generation wRfx: NONREACTIVE

## 2023-04-26 MED ORDER — POTASSIUM CHLORIDE CRYS ER 20 MEQ PO TBCR: 20.0000 meq | EXTENDED_RELEASE_TABLET | Freq: Every day | ORAL | 0 refills | Status: DC

## 2023-04-26 MED ORDER — MAGNESIUM 400 MG PO CAPS: 1.0000 | ORAL_CAPSULE | Freq: Every day | ORAL | Status: AC

## 2023-04-26 NOTE — Discharge Summary (Signed)
Physician Discharge Summary  Kristin [name] ZOX:[id number] DOB: 08/02/1955 DOA: 04/25/2023  PCP: [name], MD  Admit date: 04/25/2023 Discharge date: 04/26/2023  Admitted From: Home  Discharge disposition: Home    Recommendations for Outpatient Follow-Up:   Follow up with your primary care provider in one week.  Check CBC, BMP, magnesium in the next visit Follow-up with cardiology as outpatient when scheduled   Discharge Diagnosis:   Principal Problem:   Electrolyte disturbance Active Problems:   Hypercalcemia   Hypomagnesemia   Hypokalemia   Transient hypotension   Paroxysmal atrial fibrillation (HCC)   Chronic anticoagulation   Chest pain   Prolonged QT interval   Type 2 diabetes mellitus (HCC)   Erythrocytosis   Anxiety and depression   Hyperlipidemia   OSA on CPAP   Morbid obesity, unspecified obesity type (HCC)  Discharge Condition: Improved.  Diet recommendation: Low sodium, heart healthy.  Carbohydrate-modified.    Wound care: None.  Code status: Full.   History of Present Illness:   Kristin [name] is a 68 y.o. female with past medical history significant of hypertension, hyperlipidemia, paroxysmal atrial fibrillation on chronic anticoagulation, diabetes mellitus type 2, anxiety, depression, GERD, OSA, and morbid obesity presented to hospital with palpitations with left chest discomfort.  Recent sore throat with some changes in her bowel habits..  he patient took 30 mg of Diltiazem, which she normally only takes when experiencing a racing heart.  She took an additional diltiazem 30 mg as symptoms were still present after an hour without improvement in symptoms. on Ozempic and Jardiance for diabetes management. The patient also uses a CPAP machine for sleep and is compliant with its use.  EMS was called and patient was noted to be hypotensive with tachycardia and was given 700 mL of IV fluids en route to the hospital.  In the ED patient was in rapid  atrial fibrillation with heart rate of 143 with borderline low blood pressure.  Labs showed hypokalemia with potassium 2.3, CO2 16, glucose 265, calcium 5.7, and magnesium 1.  Chest x-ray did not show any infiltrate.  Patient had been given metoprolol 2.5 mg IV x 1 dose, flecainide 200 mg p.o., potassium chloride 90 mEq in total, magnesium sulfate 2 g IV, and calcium gluconate 1 g IV.  Hospital Course:   Following conditions were addressed during hospitalization as listed below,  Paroxysmal atrial fibrillation with rapid ventricular response.  On chronic anticoagulation. TSH within normal limits.  Continue Xarelto, flecainide on discharge.  Review of previous 2D echocardiogram from 08/2020 with LV ejection fraction of 55%.  Continue to  replenish electrolytes as needed.  Rate has improved at this time.  Will need to follow-up with cardiology as outpatient.   Chest discomfort with  palpitation.  Resolved at this time.  Troponin mildly elevated but less than baseline.  2D echocardiogram with LV ejection fraction of 40 to 45%.  Grade 2 diastolic dysfunction.   Hypokalemia,  Improved after replacement.  Potassium today at 4.1.  Continue to monitor.  Will continue oral potassium for next 5 days and discharged.   hypocalcemia,  Improved, received calcium gluconate 1 g.  Latest calcium of 9.4.   Hypomagnesemia Improved after replacement.  Received magnesium sulfate 2 g,  latest magnesium of 2.0.  Will continue magnesium oxide orally on discharge.   Diarrhea Was constipated and had taken stool softeners.  Has not to take stool softener if having diarrhea.   Transient hypotension Improved with IV fluids.  Will  hold diuretic until tomorrow.   Prolonged QT interval Acute.  QTc 520 on admission.  Electrolytes were replenished and have improved.  Will continue potassium and magnesium on discharge.  Resume as pain control at this time.   Diabetes mellitus type 2, with hyperglycemia Recent hemoglobin  A1c of 9.6.  On Jardiance and Ozempic as outpatient.  Will resume on discharge.  Anxiety and depression Continue bupropion and vilazodone   Hyperlipidemia -Continue atorvastatin   OSA on CPAP -Continue CPAP nightly   Morbid obesity Body mass index is 45.89 kg/m.  -Continue Ozempic and follow-up with primary care provider in the outpatient setting.  Disposition.  At this time, patient is stable for disposition home with outpatient PCP and regular cardiology follow-up.  Medical Consultants:   None.  Procedures:    None Subjective:   Today, patient seen and examined at bedside.  Feels better.  Denies any dizziness, lightheadedness shortness of breath chest pain palpitation.  Discharge Exam:   Vitals:   04/26/23 0803 04/26/23 1142  BP: (!) 119/55 (!) 149/91  Pulse: 74 88  Resp: 18 18  Temp: 97.7 F (36.5 C) 98 F (36.7 C)  SpO2: 98% 97%   Vitals:   04/26/23 0448 04/26/23 0500 04/26/23 0803 04/26/23 1142  BP: 133/63  (!) 119/55 (!) 149/91  Pulse: 71  74 88  Resp: 19  18 18   Temp: 97.7 F (36.5 C)  97.7 F (36.5 C) 98 F (36.7 C)  TempSrc: Axillary  Oral Oral  SpO2: 95%  98% 97%  Weight:  117.5 kg    Height:       GENERAL: Patient is alert awake and oriented. Not in obvious distress. HENT: No scleral pallor or icterus. Pupils equally reactive to light. Oral mucosa is moist NECK: is supple, no gross swelling noted. CHEST: Clear to auscultation. No crackles or wheezes.  Diminished breath sounds bilaterally. CVS: S1 and S2 heard, no murmur. Regular rate and rhythm.  ABDOMEN: Soft, non-tender, bowel sounds are present. EXTREMITIES: No edema. CNS: Cranial nerves are intact. No focal motor deficits. SKIN: warm and dry without rashes.  The results of significant diagnostics from this hospitalization (including imaging, microbiology, ancillary and laboratory) are listed below for reference.     Diagnostic Studies:   ECHOCARDIOGRAM COMPLETE Result Date:  04/25/2023    ECHOCARDIOGRAM REPORT   Patient Name:   Kristin [name] Date of Exam: 04/25/2023 Medical Rec #:  [id number]       Height:       63.0 in Accession #:    [id number]      Weight:       259.0 lb Date of Birth:  1956-02-06        BSA:          2.158 m Patient Age:    67 years        BP:           110/76 mmHg Patient Gender: F               HR:           83 bpm. Exam Location:  Inpatient Procedure: 2D Echo, Cardiac Doppler and Color Doppler Indications:    Afib  History:        Patient has prior history of Echocardiogram examinations, most                 recent 09/19/2020. Arrythmias:Atrial Fibrillation; Risk  Factors:Diabetes, Hypertension and Dyslipidemia.  Sonographer:    Melton Krebs RDCS, FE, PE Referring Phys: [id number] RONDELL A SMITH IMPRESSIONS  1. Left ventricular ejection fraction, by estimation, is 40 to 45%. The left ventricle has mildly decreased function. The left ventricle demonstrates global hypokinesis. There is moderate concentric left ventricular hypertrophy. Left ventricular diastolic parameters are consistent with Grade II diastolic dysfunction (pseudonormalization).  2. Right ventricular systolic function is normal. The right ventricular size is normal.  3. The mitral valve is normal in structure. No evidence of mitral valve regurgitation.  4. The aortic valve is normal in structure. Aortic valve regurgitation is not visualized.  5. The inferior vena cava is normal in size with greater than 50% respiratory variability, suggesting right atrial pressure of 3 mmHg. FINDINGS  Left Ventricle: Left ventricular ejection fraction, by estimation, is 40 to 45%. The left ventricle has mildly decreased function. The left ventricle demonstrates global hypokinesis. The left ventricular internal cavity size was normal in size. There is  moderate concentric left ventricular hypertrophy. Abnormal (paradoxical) septal motion, consistent with left bundle branch block. Left ventricular  diastolic parameters are consistent with Grade II diastolic dysfunction (pseudonormalization). Right Ventricle: The right ventricular size is normal. No increase in right ventricular wall thickness. Right ventricular systolic function is normal. Left Atrium: Left atrial size was normal in size. Right Atrium: Right atrial size was normal in size. Pericardium: There is no evidence of pericardial effusion. Mitral Valve: The mitral valve is normal in structure. No evidence of mitral valve regurgitation. Tricuspid Valve: The tricuspid valve is normal in structure. Tricuspid valve regurgitation is trivial. Aortic Valve: The aortic valve is normal in structure. Aortic valve regurgitation is not visualized. Aortic valve mean gradient measures 1.0 mmHg. Aortic valve peak gradient measures 2.7 mmHg. Aortic valve area, by VTI measures 3.57 cm. Pulmonic Valve: The pulmonic valve was not well visualized. Pulmonic valve regurgitation is trivial. Aorta: The aortic root and ascending aorta are structurally normal, with no evidence of dilitation. Venous: The inferior vena cava is normal in size with greater than 50% respiratory variability, suggesting right atrial pressure of 3 mmHg. IAS/Shunts: No atrial level shunt detected by color flow Doppler.  LEFT VENTRICLE PLAX 2D LVIDd:         3.90 cm     Diastology LVIDs:         3.10 cm     LV e' medial:    4.90 cm/s LV PW:         1.20 cm     LV E/e' medial:  12.6 LV IVS:        1.30 cm     LV e' lateral:   4.56 cm/s LVOT diam:     2.20 cm     LV E/e' lateral: 13.5 LV SV:         57 LV SV Index:   27 LVOT Area:     3.80 cm  LV Volumes (MOD) LV vol d, MOD A2C: 59.9 ml LV vol d, MOD A4C: 97.9 ml LV vol s, MOD A2C: 29.0 ml LV vol s, MOD A4C: 50.2 ml LV SV MOD A2C:     30.9 ml LV SV MOD A4C:     97.9 ml LV SV MOD BP:      40.0 ml RIGHT VENTRICLE RV S prime:     8.94 cm/s TAPSE (M-mode): 1.1 cm LEFT ATRIUM             Index  RIGHT ATRIUM           Index LA diam:        3.40 cm 1.58  cm/m   RA Area:     11.30 cm LA Vol (A2C):   39.2 ml 18.16 ml/m  RA Volume:   23.50 ml  10.89 ml/m LA Vol (A4C):   55.3 ml 25.62 ml/m LA Biplane Vol: 48.7 ml 22.57 ml/m  AORTIC VALVE AV Area (Vmax):    3.43 cm AV Area (Vmean):   3.24 cm AV Area (VTI):     3.57 cm AV Vmax:           81.40 cm/s AV Vmean:          54.300 cm/s AV VTI:            0.161 m AV Peak Grad:      2.7 mmHg AV Mean Grad:      1.0 mmHg LVOT Vmax:         73.40 cm/s LVOT Vmean:        46.300 cm/s LVOT VTI:          0.151 m LVOT/AV VTI ratio: 0.94  AORTA Ao Root diam: 3.10 cm Ao Asc diam:  3.00 cm MITRAL VALVE               TRICUSPID VALVE MV Area (PHT): 3.53 cm    TR Peak grad:   5.4 mmHg MV Decel Time: 215 msec    TR Vmax:        116.00 cm/s MV E velocity: 61.70 cm/s MV A velocity: 53.10 cm/s  SHUNTS MV E/A ratio:  1.16        Systemic VTI:  0.15 m                            Systemic Diam: 2.20 cm Aditya Sabharwal Electronically signed by Dorthula Nettles Signature Date/Time: 04/25/2023/10:49:44 AM    Final    DG Chest Port 1 View Result Date: 04/25/2023 CLINICAL DATA:  68 year old female atrial fibrillation. EXAM: PORTABLE CHEST 1 VIEW COMPARISON:  Chest CT 10/17/2022 and earlier. FINDINGS: Portable AP upright view at 0553 hours. Stable somewhat low lung volumes. Mediastinal contours remain normal. Visualized tracheal air column is within normal limits. Allowing for portable technique the lungs are clear. No pneumothorax or pleural effusion. Paucity of bowel gas in the visible abdomen. No acute osseous abnormality identified. IMPRESSION: Negative portable chest. Electronically Signed   By: Odessa Fleming M.D.   On: 04/25/2023 06:01     Labs:   Basic Metabolic Panel: Recent Labs  Lab 04/25/23 0542 04/25/23 1406 04/26/23 0253  NA 139 138 141  K 2.3* 4.5 4.1  CL 116* 105 106  CO2 16* 25 26  GLUCOSE 265* 205* 204*  BUN 12 13 16   CREATININE 0.62 0.85 0.84  CALCIUM 5.7* 9.5 9.4  MG 1.0*  --  2.0   GFR Estimated Creatinine  Clearance: 81.5 mL/min (by C-G formula based on SCr of 0.84 mg/dL). Liver Function Tests: Recent Labs  Lab 04/26/23 0253  AST 19  ALT 27  ALKPHOS 67  BILITOT 0.7  PROT 6.1*  ALBUMIN 3.3*   No results for input(s): "LIPASE", "AMYLASE" in the last 168 hours. No results for input(s): "AMMONIA" in the last 168 hours. Coagulation profile No results for input(s): "INR", "PROTIME" in the last 168 hours.  CBC: Recent Labs  Lab 04/25/23 0542 04/26/23 0253  WBC  9.9 7.2  NEUTROABS 8.1*  --   HGB 16.5* 14.8  HCT 52.4* 45.8  MCV 89.1 87.9  PLT 260 267   Cardiac Enzymes: No results for input(s): "CKTOTAL", "CKMB", "CKMBINDEX", "TROPONINI" in the last 168 hours. BNP: Invalid input(s): "POCBNP" CBG: Recent Labs  Lab 04/25/23 1219 04/25/23 2035 04/26/23 0537 04/26/23 1155 04/26/23 1205  GLUCAP 199* 246* 169* 199* 204*   D-Dimer No results for input(s): "DDIMER" in the last 72 hours. Hgb A1c Recent Labs    04/26/23 0253  HGBA1C 9.6*   Lipid Profile No results for input(s): "CHOL", "HDL", "LDLCALC", "TRIG", "CHOLHDL", "LDLDIRECT" in the last 72 hours. Thyroid function studies Recent Labs    04/25/23 1406  TSH 0.722   Anemia work up No results for input(s): "VITAMINB12", "FOLATE", "FERRITIN", "TIBC", "IRON", "RETICCTPCT" in the last 72 hours. Microbiology No results found for this or any previous visit (from the past 240 hours).   Discharge Instructions:   Discharge Instructions     Call MD for:  difficulty breathing, headache or visual disturbances   Complete by: As directed    Call MD for:  persistant nausea and vomiting   Complete by: As directed    Diet Carb Modified   Complete by: As directed    Discharge instructions   Complete by: As directed    Do not take stool softeners if having diarrhea.  Take magnesium and potassium for next few days.  Take Lasix (water pill) from 04/27/2023.  Seek medical attention for worsening symptoms.  Follow-up with your  primary care provider in 1 week and check blood work at that time.  Follow-up with your cardiologist as scheduled by you.   Increase activity slowly   Complete by: As directed       Allergies as of 04/26/2023       Reactions   Dilaudid [hydromorphone]    Other reaction(s): Respiratory Distress, Unknown   Sulfa Antibiotics Other (See Comments), Itching   Yeast infection/itching   Dilaudid [hydromorphone Hcl] Other (See Comments)   Changed respirations   Irbesartan Swelling   Keflex [cephalexin] Other (See Comments)   Yeast infection/itching   Metformin Hcl Diarrhea   Sulindac Other (See Comments)   Yeast infection/itching Other reaction(s): Unknown   Tramadol Itching   itching        Medication List     PAUSE taking these medications    furosemide 20 MG tablet Wait to take this until: April 27, 2023 Morning Commonly known as: LASIX Take 1 tablet (20 mg total) by mouth every morning.       TAKE these medications    acetaminophen-caffeine 500-65 MG Tabs per tablet Commonly known as: EXCEDRIN TENSION HEADACHE Take 1 tablet by mouth as needed (headache, pain).   ALPRAZolam 0.25 MG tablet Commonly known as: XANAX Take 0.25 mg by mouth daily as needed (afib/panic attacks).   atorvastatin 20 MG tablet Commonly known as: LIPITOR Take 20 mg by mouth at bedtime.   Blink Tears 0.25 % Soln Generic drug: Polyethylene Glycol 400 Place 1 drop into both eyes daily as needed (dry eyes).   buPROPion HCl ER (XL) 450 MG Tb24 Take 1 tablet by mouth daily.   cetirizine 10 MG tablet Commonly known as: ZYRTEC Take 1 tablet (10 mg total) by mouth daily.   diclofenac Sodium 1 % Gel Commonly known as: VOLTAREN Apply 2-4 g topically daily as needed (pain).   diltiazem 180 MG 24 hr capsule Commonly known as: CARDIZEM CD TAKE 1  CAPSULE(180 MG) BY MOUTH DAILY   diltiazem 30 MG tablet Commonly known as: Cardizem Take 1 tablet (30 mg total) by mouth every 6 (six) hours as  needed (for elevated heart rates).   flecainide 50 MG tablet Commonly known as: TAMBOCOR TAKE 1 TABLET(50 MG) BY MOUTH TWICE DAILY   HYDROcodone-acetaminophen 5-325 MG tablet Commonly known as: NORCO/VICODIN Take 1 tablet by mouth every 6 (six) hours as needed for severe pain.   Jardiance 25 MG Tabs tablet Generic drug: empagliflozin Take 1 tablet by mouth daily.   Magnesium 400 MG Caps Take 1 capsule by mouth daily.   methylphenidate 10 MG tablet Commonly known as: RITALIN Take 10 mg by mouth as needed (narcoleptic episodes when driving long distances.).   metoprolol succinate 100 MG 24 hr tablet Commonly known as: TOPROL-XL Take 1 tablet (100 mg total) by mouth daily.   metroNIDAZOLE 0.75 % cream Commonly known as: METROCREAM Apply 1 application topically daily as needed (rosacea).   omeprazole 20 MG tablet Commonly known as: PRILOSEC OTC Take 20 mg by mouth at bedtime.   Ozempic (0.25 or 0.5 MG/DOSE) 2 MG/3ML Sopn Generic drug: Semaglutide(0.25 or 0.5MG /DOS) Inject 1 mg into the skin once a week.   potassium chloride SA 20 MEQ tablet Commonly known as: KLOR-CON M Take 1 tablet (20 mEq total) by mouth daily for 5 days.   rivaroxaban 20 MG Tabs tablet Commonly known as: Xarelto Take 1 tablet (20 mg total) by mouth daily with supper.   rOPINIRole 2 MG tablet Commonly known as: REQUIP Take 1-2 mg by mouth See admin instructions. Take 1/2 tablet (1 mg) by mouth with supper as needed for restless legs, take 1 tablet (2 mg) daily at bedtime   tiZANidine 2 MG tablet Commonly known as: ZANAFLEX Take 2 mg by mouth 3 (three) times daily as needed.   Trospium Chloride 60 MG Cp24 Take 1 capsule (60 mg total) by mouth daily.   Vilazodone HCl 20 MG Tabs Take 20 mg by mouth daily.   Vitamin D-3 125 MCG (5000 UT) Tabs Take 5,000 Units by mouth at bedtime.          Time coordinating discharge: 39 minutes  Signed:  Sharion Grieves  [facility] 04/26/2023, 2:37 PM

## 2023-04-26 NOTE — TOC Transition Note (Signed)
Transition of Care [facility]) - Discharge Note   Patient Details  Name: Kristin [name] MRN: [id number] Date of Birth: Jun 15, 1955  [facility] CM/SW Contact:  Leone Haven, RN Phone Number: 04/26/2023, 2:42 PM   Clinical Narrative:    For dc today, has no needs.   Final next level of care: Home/Self Care Barriers to Discharge: No Barriers Identified   Patient Goals and CMS Choice Patient states their goals for this hospitalization and ongoing recovery are:: return home   Choice offered to / list presented to : NA      Discharge Placement                       Discharge Plan and Services Additional resources added to the After Visit Summary for   In-house Referral: NA Discharge Planning Services: CM Consult Post Acute Care Choice: NA          DME Arranged: N/A DME Agency: NA       HH Arranged: NA          Social Drivers of Health (SDOH) Interventions SDOH Screenings   Food Insecurity: No Food Insecurity (04/25/2023)  Housing: Low Risk  (04/25/2023)  Transportation Needs: No Transportation Needs (04/25/2023)  Utilities: Not At Risk (04/25/2023)  Social Connections: Socially Isolated (04/25/2023)  Tobacco Use: Low Risk  (04/25/2023)     Readmission Risk Interventions     No data to display

## 2023-04-26 NOTE — Hospital Course (Addendum)
[name] Kitchen

## 2023-04-26 NOTE — Progress Notes (Addendum)
Pt has orders to be discharged. Discharge instructions given and pt has no additional questions at this time. Medication regimen reviewed and pt educated. Pt verbalized understanding and has no additional questions. Telemetry box removed. IV removed and site in good condition. Pt stable and waiting for transportation in the discharge lounge per request from hospital.

## 2023-04-26 NOTE — TOC Initial Note (Signed)
Transition of Care [facility]) - Initial/Assessment Note    Patient Details  Name: Kristin [name] MRN: [id number] Date of Birth: [birth year]  Transition of Care [facility]) CM/SW Contact:    Leone Haven, RN Phone Number: 04/26/2023, 2:41 PM  Clinical Narrative:                 From home alone, indep has PCP and insurance on file, states has no HH services in place at this time, has cpap ,walker, and a cane at home.  States a friend will transport them home at [employer] and niece is support system, states gets medications from [facility].  Pta self ambulatory with walker.  Expected Discharge Plan: Home/Self Care Barriers to Discharge: No Barriers Identified   Patient Goals and CMS Choice Patient states their goals for this hospitalization and ongoing recovery are:: return home   Choice offered to / list presented to : NA      Expected Discharge Plan and Services In-house Referral: NA Discharge Planning Services: CM Consult Post Acute Care Choice: NA Living arrangements for the past 2 months: Single Family Home Expected Discharge Date: 04/26/23               DME Arranged: N/A DME Agency: NA       HH Arranged: NA          Prior Living Arrangements/Services Living arrangements for the past 2 months: Single Family Home Lives with:: Self Patient language and need for interpreter reviewed:: Yes Do you feel safe going back to the place where you live?: Yes      Need for Family Participation in Patient Care: No (Comment) Care giver support system in place?: No (comment) Current home services: DME (cpap, walker, cane) Criminal Activity/Legal Involvement Pertinent to Current Situation/Hospitalization: No - Comment as needed  Activities of Daily Living   ADL Screening (condition at time of admission) Independently performs ADLs?: Yes (appropriate for developmental age) Is the patient deaf or have difficulty hearing?: No Does the patient have difficulty seeing,  even when wearing glasses/contacts?: No Does the patient have difficulty concentrating, remembering, or making decisions?: No  Permission Sought/Granted Permission sought to share information with : Case Manager Permission granted to share information with : Yes, Verbal Permission Granted              Emotional Assessment Appearance:: Appears stated age Attitude/Demeanor/Rapport: Engaged Affect (typically observed): Appropriate Orientation: : Oriented to Self, Oriented to Place, Oriented to  Time, Oriented to Situation   Psych Involvement: No (comment)  Admission diagnosis:  Hypocalcemia [E83.51] Hypokalemia [E87.6] Hypomagnesemia [E83.42] Polycythemia [D75.1] Paroxysmal atrial fibrillation (HCC) [I48.0] Electrolyte disturbance [E87.8] Patient Active Problem List   Diagnosis Date Noted   Electrolyte disturbance 04/25/2023   Transient hypotension 04/25/2023   Prolonged QT interval 04/25/2023   Erythrocytosis 04/25/2023   Hypokalemia 04/25/2023   Constipation 01/31/2023   Nocturia 01/31/2023   Acquired thrombophilia (HCC) 11/09/2021   Anxiety and depression 11/09/2021   Attention deficit hyperactivity disorder, predominantly inattentive type 11/09/2021   Binge eating disorder 11/09/2021   Body mass index (BMI) 45.0-49.9, adult (HCC) 11/09/2021   Cyst of ovary 11/09/2021   Essential hypertension 11/09/2021   Gastroesophageal reflux disease 11/09/2021   Hardening of the aorta (main artery of the heart) (HCC) 11/09/2021   Hypomagnesemia 11/09/2021   Migraine 11/09/2021   Recurrent major depression in remission (HCC) 11/09/2021   Restless legs 11/09/2021   Rosacea 11/09/2021   Mixed stress and urge urinary  incontinence 11/09/2021   Chronic anticoagulation 09/21/2021   Lower extremity edema 09/21/2021   Fracture of distal phalanx of finger 02/17/2021   GIB (gastrointestinal bleeding) 09/21/2020   GI bleed 09/19/2020   Rotator cuff tear arthropathy of left shoulder  07/19/2017   Impingement syndrome of right shoulder 04/08/2016   Ganglion, left wrist 04/08/2016   Chronic midline low back pain without sciatica 04/08/2016   Microcytic anemia    Diabetes mellitus with coincident hypertension (HCC) 11/04/2014   Right nephrolithiasis 08/27/2013   Left nephrolithiasis 08/27/2013   Bradycardia 08/27/2013   Paroxysmal atrial fibrillation (HCC) 08/27/2013   Chest pain 12/15/2012   LBBB (left bundle branch block) - rate related 12/03/2012   Type 2 diabetes mellitus (HCC) 12/03/2012   Morbid obesity, unspecified obesity type (HCC) 12/03/2012   Hypercalcemia 12/03/2012   Elevated LFTs 12/03/2012   OSA on CPAP 12/03/2012   Hyperlipidemia 12/03/2012   Normocytic anemia 12/03/2012   Cough 01/17/2012   PCP:  [name], MD Pharmacy:   [id number] [name], [place] - [address] AT [facility] & [address] [place] [address] Phone: [phone] Fax: [phone]     Social Drivers of Health (SDOH) Social History: SDOH Screenings   Food Insecurity: No Food Insecurity (04/25/2023)  Housing: Low Risk  (04/25/2023)  Transportation Needs: No Transportation Needs (04/25/2023)  Utilities: Not At Risk (04/25/2023)  Social Connections: Socially Isolated (04/25/2023)  Tobacco Use: Low Risk  (04/25/2023)   SDOH Interventions:     Readmission Risk Interventions     No data to display

## 2023-04-26 NOTE — Care Management Obs Status (Signed)
MEDICARE OBSERVATION STATUS NOTIFICATION   Patient Details  Name: Kristin [name] MRN: [id number] Date of Birth: [birth year]   Medicare Observation Status Notification Given:  Yes    Leone Haven, RN 04/26/2023, 10:25 AM

## 2023-04-27 ENCOUNTER — Other Ambulatory Visit: Payer: Self-pay | Admitting: Cardiology

## 2023-06-01 ENCOUNTER — Other Ambulatory Visit: Payer: Self-pay | Admitting: Obstetrics

## 2023-06-01 DIAGNOSIS — R351 Nocturia: Secondary | ICD-10-CM

## 2023-06-01 DIAGNOSIS — N3946 Mixed incontinence: Secondary | ICD-10-CM

## 2023-06-02 ENCOUNTER — Other Ambulatory Visit: Payer: Self-pay | Admitting: Allergy

## 2023-06-29 ENCOUNTER — Other Ambulatory Visit: Payer: Self-pay

## 2023-06-29 ENCOUNTER — Emergency Department (HOSPITAL_COMMUNITY)

## 2023-06-29 ENCOUNTER — Observation Stay (HOSPITAL_COMMUNITY)
Admission: EM | Admit: 2023-06-29 | Discharge: 2023-06-30 | Disposition: A | Discharge status: Home / Self Care | Attending: Internal Medicine | Admitting: Internal Medicine

## 2023-06-29 DIAGNOSIS — F418 Other specified anxiety disorders: Secondary | ICD-10-CM | POA: Diagnosis not present

## 2023-06-29 DIAGNOSIS — G4733 Obstructive sleep apnea (adult) (pediatric): Secondary | ICD-10-CM | POA: Insufficient documentation

## 2023-06-29 DIAGNOSIS — F419 Anxiety disorder, unspecified: Secondary | ICD-10-CM | POA: Diagnosis present

## 2023-06-29 DIAGNOSIS — Z7984 Long term (current) use of oral hypoglycemic drugs: Secondary | ICD-10-CM | POA: Insufficient documentation

## 2023-06-29 DIAGNOSIS — Z7985 Long-term (current) use of injectable non-insulin antidiabetic drugs: Secondary | ICD-10-CM | POA: Insufficient documentation

## 2023-06-29 DIAGNOSIS — Z7901 Long term (current) use of anticoagulants: Secondary | ICD-10-CM | POA: Diagnosis not present

## 2023-06-29 DIAGNOSIS — E119 Type 2 diabetes mellitus without complications: Secondary | ICD-10-CM

## 2023-06-29 DIAGNOSIS — L03316 Cellulitis of umbilicus: Secondary | ICD-10-CM | POA: Insufficient documentation

## 2023-06-29 DIAGNOSIS — L089 Local infection of the skin and subcutaneous tissue, unspecified: Secondary | ICD-10-CM | POA: Diagnosis present

## 2023-06-29 DIAGNOSIS — I5042 Chronic combined systolic (congestive) and diastolic (congestive) heart failure: Secondary | ICD-10-CM | POA: Insufficient documentation

## 2023-06-29 DIAGNOSIS — E1165 Type 2 diabetes mellitus with hyperglycemia: Secondary | ICD-10-CM | POA: Insufficient documentation

## 2023-06-29 DIAGNOSIS — E785 Hyperlipidemia, unspecified: Secondary | ICD-10-CM | POA: Insufficient documentation

## 2023-06-29 DIAGNOSIS — Z6841 Body Mass Index (BMI) 40.0 and over, adult: Secondary | ICD-10-CM | POA: Insufficient documentation

## 2023-06-29 DIAGNOSIS — I4891 Unspecified atrial fibrillation: Principal | ICD-10-CM | POA: Diagnosis present

## 2023-06-29 DIAGNOSIS — Z7722 Contact with and (suspected) exposure to environmental tobacco smoke (acute) (chronic): Secondary | ICD-10-CM | POA: Insufficient documentation

## 2023-06-29 DIAGNOSIS — I480 Paroxysmal atrial fibrillation: Secondary | ICD-10-CM | POA: Diagnosis not present

## 2023-06-29 DIAGNOSIS — Z79899 Other long term (current) drug therapy: Secondary | ICD-10-CM | POA: Insufficient documentation

## 2023-06-29 DIAGNOSIS — R002 Palpitations: Secondary | ICD-10-CM | POA: Diagnosis present

## 2023-06-29 DIAGNOSIS — I110 Hypertensive heart disease with heart failure: Secondary | ICD-10-CM | POA: Diagnosis not present

## 2023-06-29 LAB — CBC
HCT: 49.5 % — ABNORMAL HIGH (ref 36.0–46.0)
Hemoglobin: 15.9 g/dL — ABNORMAL HIGH (ref 12.0–15.0)
MCH: 28.3 pg (ref 26.0–34.0)
MCHC: 32.1 g/dL (ref 30.0–36.0)
MCV: 88.1 fL (ref 80.0–100.0)
Platelets: 285 10*3/uL (ref 150–400)
RBC: 5.62 MIL/uL — ABNORMAL HIGH (ref 3.87–5.11)
RDW: 16.3 % — ABNORMAL HIGH (ref 11.5–15.5)
WBC: 8.9 10*3/uL (ref 4.0–10.5)
nRBC: 0 % (ref 0.0–0.2)

## 2023-06-29 MED ORDER — DILTIAZEM HCL-DEXTROSE 125-5 MG/125ML-% IV SOLN (PREMIX)
5.0000 mg/h | INTRAVENOUS | Status: DC
  Administered 2023-06-29: 5 mg/h via INTRAVENOUS
  Filled 2023-06-29: qty 125

## 2023-06-29 MED ORDER — DILTIAZEM LOAD VIA INFUSION
15.0000 mg | Freq: Once | INTRAVENOUS | Status: AC
  Administered 2023-06-29: 15 mg via INTRAVENOUS
  Filled 2023-06-29: qty 15

## 2023-06-29 NOTE — ED Notes (Signed)
 XR at bedside

## 2023-06-29 NOTE — ED Triage Notes (Signed)
 Pt BIB [facility] from home. Called out for palpitation related to her a fib. Hx of a fib. Cardizem 35 and 300 mL of LR given in route. HR improvement. When asked about medication complain pt sts "I'm pretty sure"   CBG 378, BP 190, HR 146 > 120s.

## 2023-06-30 ENCOUNTER — Encounter (HOSPITAL_COMMUNITY): Payer: Self-pay | Admitting: Internal Medicine

## 2023-06-30 ENCOUNTER — Ambulatory Visit: Admitting: Obstetrics

## 2023-06-30 ENCOUNTER — Observation Stay (HOSPITAL_COMMUNITY)

## 2023-06-30 DIAGNOSIS — Z6841 Body Mass Index (BMI) 40.0 and over, adult: Secondary | ICD-10-CM

## 2023-06-30 DIAGNOSIS — E1140 Type 2 diabetes mellitus with diabetic neuropathy, unspecified: Secondary | ICD-10-CM | POA: Diagnosis not present

## 2023-06-30 DIAGNOSIS — Z7901 Long term (current) use of anticoagulants: Secondary | ICD-10-CM | POA: Diagnosis not present

## 2023-06-30 DIAGNOSIS — F32A Depression, unspecified: Secondary | ICD-10-CM

## 2023-06-30 DIAGNOSIS — I5042 Chronic combined systolic (congestive) and diastolic (congestive) heart failure: Secondary | ICD-10-CM | POA: Diagnosis not present

## 2023-06-30 DIAGNOSIS — L089 Local infection of the skin and subcutaneous tissue, unspecified: Secondary | ICD-10-CM | POA: Diagnosis not present

## 2023-06-30 DIAGNOSIS — I4891 Unspecified atrial fibrillation: Secondary | ICD-10-CM | POA: Diagnosis not present

## 2023-06-30 DIAGNOSIS — G4733 Obstructive sleep apnea (adult) (pediatric): Secondary | ICD-10-CM

## 2023-06-30 DIAGNOSIS — F419 Anxiety disorder, unspecified: Secondary | ICD-10-CM

## 2023-06-30 LAB — BASIC METABOLIC PANEL WITH GFR
Anion gap: 9 (ref 5–15)
BUN: 19 mg/dL (ref 8–23)
CO2: 24 mmol/L (ref 22–32)
Calcium: 8.8 mg/dL — ABNORMAL LOW (ref 8.9–10.3)
Chloride: 103 mmol/L (ref 98–111)
Creatinine, Ser: 0.88 mg/dL (ref 0.44–1.00)
GFR, Estimated: >60 mL/min (ref >60)
Glucose, Bld: 331 mg/dL — ABNORMAL HIGH (ref 70–99)
Potassium: 3.7 mmol/L (ref 3.5–5.1)
Sodium: 136 mmol/L (ref 135–145)

## 2023-06-30 LAB — COMPREHENSIVE METABOLIC PANEL WITH GFR
ALT: 23 U/L (ref 0–44)
AST: 26 U/L (ref 15–41)
Albumin: 3.1 g/dL — ABNORMAL LOW (ref 3.5–5.0)
Alkaline Phosphatase: 66 U/L (ref 38–126)
Anion gap: 11 (ref 5–15)
BUN: 16 mg/dL (ref 8–23)
CO2: 22 mmol/L (ref 22–32)
Calcium: 8.8 mg/dL — ABNORMAL LOW (ref 8.9–10.3)
Chloride: 104 mmol/L (ref 98–111)
Creatinine, Ser: 0.85 mg/dL (ref 0.44–1.00)
GFR, Estimated: >60 mL/min (ref >60)
Glucose, Bld: 350 mg/dL — ABNORMAL HIGH (ref 70–99)
Potassium: 4.2 mmol/L (ref 3.5–5.1)
Sodium: 137 mmol/L (ref 135–145)
Total Bilirubin: 1.2 mg/dL (ref 0.0–1.2)
Total Protein: 5.7 g/dL — ABNORMAL LOW (ref 6.5–8.1)

## 2023-06-30 LAB — CBC WITH DIFFERENTIAL/PLATELET
Abs Immature Granulocytes: 0.04 10*3/uL (ref 0.00–0.07)
Basophils Absolute: 0.1 10*3/uL (ref 0.0–0.1)
Basophils Relative: 1 %
Eosinophils Absolute: 0.2 10*3/uL (ref 0.0–0.5)
Eosinophils Relative: 2 %
HCT: 46.6 % — ABNORMAL HIGH (ref 36.0–46.0)
Hemoglobin: 15.3 g/dL — ABNORMAL HIGH (ref 12.0–15.0)
Immature Granulocytes: 0 %
Lymphocytes Relative: 21 %
Lymphs Abs: 2 10*3/uL (ref 0.7–4.0)
MCH: 28.4 pg (ref 26.0–34.0)
MCHC: 32.8 g/dL (ref 30.0–36.0)
MCV: 86.5 fL (ref 80.0–100.0)
Monocytes Absolute: 0.7 10*3/uL (ref 0.1–1.0)
Monocytes Relative: 7 %
Neutro Abs: 6.7 10*3/uL (ref 1.7–7.7)
Neutrophils Relative %: 69 %
Platelets: 276 10*3/uL (ref 150–400)
RBC: 5.39 MIL/uL — ABNORMAL HIGH (ref 3.87–5.11)
RDW: 16.5 % — ABNORMAL HIGH (ref 11.5–15.5)
WBC: 9.6 10*3/uL (ref 4.0–10.5)
nRBC: 0 % (ref 0.0–0.2)

## 2023-06-30 LAB — TROPONIN I (HIGH SENSITIVITY)
Troponin I (High Sensitivity): 14 ng/L (ref <18)
Troponin I (High Sensitivity): 43 ng/L — ABNORMAL HIGH (ref <18)
Troponin I (High Sensitivity): 44 ng/L — ABNORMAL HIGH (ref <18)

## 2023-06-30 LAB — ECHOCARDIOGRAM LIMITED
Area-P 1/2: 4.01 cm2
Calc EF: 59.5 %
Single Plane A2C EF: 63.1 %
Single Plane A4C EF: 55.2 %

## 2023-06-30 LAB — CBG MONITORING, ED: Glucose-Capillary: 272 mg/dL — ABNORMAL HIGH (ref 70–99)

## 2023-06-30 LAB — MAGNESIUM: Magnesium: 1.8 mg/dL (ref 1.7–2.4)

## 2023-06-30 MED ORDER — ROPINIROLE HCL 1 MG PO TABS: 2.0000 mg | ORAL_TABLET | Freq: Every day | ORAL | Status: DC

## 2023-06-30 MED ORDER — EMPAGLIFLOZIN 25 MG PO TABS
25.0000 mg | ORAL_TABLET | Freq: Every day | ORAL | Status: DC
Start: 2023-06-30 — End: 2023-06-30
  Filled 2023-06-30: qty 1

## 2023-06-30 MED ORDER — LORATADINE 10 MG PO TABS
10.0000 mg | ORAL_TABLET | Freq: Every day | ORAL | Status: DC
  Administered 2023-06-30: 10 mg via ORAL
  Filled 2023-06-30: qty 1

## 2023-06-30 MED ORDER — FUROSEMIDE 20 MG PO TABS
20.0000 mg | ORAL_TABLET | Freq: Every morning | ORAL | Status: DC
  Administered 2023-06-30: 20 mg via ORAL
  Filled 2023-06-30: qty 1

## 2023-06-30 MED ORDER — VILAZODONE HCL 20 MG PO TABS
40.0000 mg | ORAL_TABLET | Freq: Every day | ORAL | Status: DC
  Administered 2023-06-30: 40 mg via ORAL
  Filled 2023-06-30: qty 2

## 2023-06-30 MED ORDER — INSULIN ASPART 100 UNIT/ML IJ SOLN: 0.0000 [IU] | Freq: Every day | INTRAMUSCULAR | Status: DC

## 2023-06-30 MED ORDER — ONDANSETRON HCL 4 MG/2ML IJ SOLN
4.0000 mg | Freq: Four times a day (QID) | INTRAMUSCULAR | Status: DC | PRN
Start: 2023-06-30 — End: 2023-06-30

## 2023-06-30 MED ORDER — ACETAMINOPHEN-CAFFEINE 500-65 MG PO TABS: 1.0000 | ORAL_TABLET | Freq: Three times a day (TID) | ORAL | Status: DC | PRN

## 2023-06-30 MED ORDER — ROPINIROLE HCL 1 MG PO TABS: 1.0000 mg | ORAL_TABLET | ORAL | Status: DC

## 2023-06-30 MED ORDER — PERFLUTREN LIPID MICROSPHERE
1.0000 mL | INTRAVENOUS | Status: AC | PRN
  Administered 2023-06-30: 2 mL via INTRAVENOUS

## 2023-06-30 MED ORDER — BUPROPION HCL ER (XL) 150 MG PO TB24
300.0000 mg | ORAL_TABLET | Freq: Every morning | ORAL | Status: DC
  Administered 2023-06-30: 300 mg via ORAL
  Filled 2023-06-30: qty 2

## 2023-06-30 MED ORDER — MAGNESIUM SULFATE IN D5W 1-5 GM/100ML-% IV SOLN
1.0000 g | Freq: Once | INTRAVENOUS | Status: AC
  Administered 2023-06-30: 1 g via INTRAVENOUS
  Filled 2023-06-30: qty 100

## 2023-06-30 MED ORDER — SULFAMETHOXAZOLE-TRIMETHOPRIM 800-160 MG PO TABS
1.0000 | ORAL_TABLET | Freq: Two times a day (BID) | ORAL | Status: DC
Start: 2023-06-30 — End: 2023-06-30

## 2023-06-30 MED ORDER — ALBUTEROL SULFATE (2.5 MG/3ML) 0.083% IN NEBU: 2.5000 mg | INHALATION_SOLUTION | Freq: Four times a day (QID) | RESPIRATORY_TRACT | Status: DC | PRN

## 2023-06-30 MED ORDER — SODIUM CHLORIDE 0.9% FLUSH
3.0000 mL | Freq: Two times a day (BID) | INTRAVENOUS | Status: DC
  Administered 2023-06-30: 3 mL via INTRAVENOUS

## 2023-06-30 MED ORDER — DICLOFENAC SODIUM 1 % EX GEL: 2.0000 g | Freq: Every day | CUTANEOUS | Status: DC | PRN

## 2023-06-30 MED ORDER — MELATONIN 3 MG PO TABS: 3.0000 mg | ORAL_TABLET | Freq: Every evening | ORAL | Status: DC | PRN

## 2023-06-30 MED ORDER — VILAZODONE HCL 40 MG PO TABS: 40.0000 mg | ORAL_TABLET | Freq: Every day | ORAL | Status: DC

## 2023-06-30 MED ORDER — HYDROCODONE-ACETAMINOPHEN 5-325 MG PO TABS: 1.0000 | ORAL_TABLET | Freq: Four times a day (QID) | ORAL | Status: DC | PRN

## 2023-06-30 MED ORDER — FLECAINIDE ACETATE 50 MG PO TABS: 50.0000 mg | ORAL_TABLET | Freq: Two times a day (BID) | ORAL | Status: DC

## 2023-06-30 MED ORDER — ALPRAZOLAM 0.25 MG PO TABS: 0.2500 mg | ORAL_TABLET | Freq: Every day | ORAL | Status: DC | PRN

## 2023-06-30 MED ORDER — INSULIN ASPART 100 UNIT/ML IJ SOLN
0.0000 [IU] | Freq: Three times a day (TID) | INTRAMUSCULAR | Status: DC
  Administered 2023-06-30: 11 [IU] via SUBCUTANEOUS

## 2023-06-30 MED ORDER — RIVAROXABAN 10 MG PO TABS: 20.0000 mg | ORAL_TABLET | Freq: Every day | ORAL | Status: DC

## 2023-06-30 MED ORDER — ROPINIROLE HCL 1 MG PO TABS: 1.0000 mg | ORAL_TABLET | Freq: Every day | ORAL | Status: DC | PRN

## 2023-06-30 MED ORDER — DILTIAZEM HCL ER COATED BEADS 180 MG PO CP24: 180.0000 mg | ORAL_CAPSULE | Freq: Every day | ORAL | Status: DC

## 2023-06-30 MED ORDER — METOPROLOL SUCCINATE ER 100 MG PO TB24
100.0000 mg | ORAL_TABLET | Freq: Every day | ORAL | Status: DC
  Administered 2023-06-30: 100 mg via ORAL
  Filled 2023-06-30: qty 4

## 2023-06-30 MED ORDER — ACETAMINOPHEN 650 MG RE SUPP: 650.0000 mg | Freq: Four times a day (QID) | RECTAL | Status: DC | PRN

## 2023-06-30 MED ORDER — SEMAGLUTIDE(0.25 OR 0.5MG/DOS) 2 MG/3ML ~~LOC~~ SOPN: 1.0000 mg | PEN_INJECTOR | SUBCUTANEOUS | Status: DC

## 2023-06-30 MED ORDER — ATORVASTATIN CALCIUM 10 MG PO TABS: 20.0000 mg | ORAL_TABLET | Freq: Every day | ORAL | Status: DC

## 2023-06-30 MED ORDER — ACETAMINOPHEN 325 MG PO TABS: 650.0000 mg | ORAL_TABLET | Freq: Four times a day (QID) | ORAL | Status: DC | PRN

## 2023-06-30 MED ORDER — TROSPIUM CHLORIDE ER 60 MG PO CP24
1.0000 | ORAL_CAPSULE | Freq: Every day | ORAL | Status: DC
Start: 2023-06-30 — End: 2023-06-30

## 2023-06-30 NOTE — Progress Notes (Signed)
  Carryover admission to the Day Admitter.  I discussed this case with the EDP, Dr. Ross Marcus.  Per these discussions:   This is a 68 year old female with history of paroxysmal atrial fibrillation who has been admitted with atrial fibrillation with RVR after presenting with new onset palpitations starting around 2100 on 06/29/2023.  The patient has a history of suboptimal compliance with her outpatient medications, including her outpatient Xarelto, metoprolol succinate, flecainide.  She conveys that she typically develops palpitations when she goes into atrial fibrillation with RVR.  Denies any associated shortness of breath or chest pain.  On arrival in the ED this evening, was noted to be in atrial fibrillation RVR, with initial heart rates in the 140s.  She was subsequently started on diltiazem drip, with heart rates improving into the 120s.  Blood pressure has been tolerating with the elevated heart rate as well as interval initiation of diltiazem drip.  EDP has discussed patient's case with on-call cardiology fellow, Dr. Jean Rosenthal, who has seen the patient and will consult.  Per initial discussions, Dr. Jean Rosenthal conveyed that it was okay to titrate the patient's diltiazem drip up to a rate of 20 mg/hr.   Presenting magnesium level is 1.8.  I have placed an order for observation to cardiac telemetry for further evaluation management of the above.  I have placed some additional preliminary admit orders via the adult multi-morbid admission order set. I have also ordered morning labs that include CMP and CBC.  Also ordered magnesium sulfate 1 g IV over 1 hour, an have resumed her home Xarelto.    Newton Pigg, DO Hospitalist

## 2023-06-30 NOTE — Discharge Summary (Signed)
 Physician Discharge Summary   Patient: Kristin [name] MRN: [id number] DOB: 1956-01-08  Admit date:     06/29/2023  Discharge date: 06/30/23  Discharge Physician: Clydie Braun   PCP: [name], MD   Recommendations at discharge:   Recommended patient follow-up with primary care provider in regards to Bactrim in combination with current medication regimen.  Discharge Diagnoses: Principal Problem:   Atrial fibrillation with rapid ventricular response (HCC) Active Problems:   Chronic anticoagulation   Type 2 diabetes mellitus (HCC)   Skin infection   Anxiety and depression   Body mass index (BMI) 45.0-49.9, adult (HCC)   OSA on CPAP  Resolved Problems:   * No resolved hospital problems. *  Hospital Course:  Kristin [name] is a 68 y.o. female with medical history significant of hypertension, hyperlipidemia, paroxysmal atrial fibrillation on chronic anticoagulation, diabetes mellitus type 2, anxiety, depression, GERD, OSA, and morbid obesity who presents with complaints of palpitations and throat pain.   She experiences palpitations that began last night between 9:00 and 9:30 PM. She has a history of similar episodes, with a previous hospitalization in January of this year for atrial fibrillation. No leg swelling is reported.   In addition to the palpitations, she describes a sore throat that started at the same time as the rapid heartbeat. She also reports pain in her teeth, jaws, ears, and head, stating that 'from the neck up it hurts.'  Patient also reports having had bloody drainage from her navel that is been present for last couple days. She follow-up with her primary yesterday was advised to start an antibiotic.   She experienced nausea several times but did not vomit. She also felt sweaty and clammy.   There have been no recent changes to her medications, including her heart medications.   In the ED patient was noted to have heart rates elevated up to 150s in atrial  fibrillation with other vital signs relatively maintained.  Labs since 4/2 significant for high-sensitivity troponin 14->43.  Cardiology had been formally consulted and patient was placed on a Cardizem drip and given 1 g of magnesium sulfate IV.  Patient was noted to have spontaneously converted back into a sinus rhythm.  Assessment and Plan: Atrial fibrillation with RVR On chronic anticoagulation Acute on chronic.  Patient noted to be in atrial fibrillation with heart rates elevated into the 150s.  Patient's home medication regimen without recent changes, but reports intermittently missing doses.  Patient was noted to spontaneously convert back into a sinus rhythm after being started on a Cardizem drip.   Cardiology evaluated and recommended patient to continue home medication regimen after echocardiogram noted preserved heart function 55 to 60%.  Patient was referred to atrial fibrillation clinic.   Uncontrolled diabetes mellitus type 2 with hyperglycemia Last hemoglobin A1c was 9.6 on 04/26/2023.  Medication regimen includes Jardiance and Ozempic.  Continue home medication regimen in the outpatient setting   Umbilical skin infection Seen on admission.  Patient reports having umbilical  wound present that have been bleeding present over the last couple of days which she was seen by her primary yesterday and she was prescribed bactrim.  however there is some labs, no action with tikosyn.-Patient advised to follow-up with pharmacy prior to taking medication to ensure no drug drug interaction.  Otherwise she will be to contact her primary coverage to this medication prior to taking   Anxiety and depression -Continue bupropion, vilazodone, and Xanax as needed   Hyperlipidemia -Continue atorvastatin  OSA -Continue CPAP nightly   Morbid obesity BMI previously noted to be greater than 45.9 kg/m -Continue Ozempic in outpatient setting        Consultants: Cardiology Procedures performed:  Echocardiogram Disposition: Home Diet recommendation:  Discharge Diet Orders (From admission, onward)     Start     Ordered   06/30/23 0000  Diet - low sodium heart healthy        06/30/23 1430           Cardiac and Carb modified diet DISCHARGE MEDICATION: Allergies as of 06/30/2023       Reactions   Dilaudid [hydromorphone]    Other reaction(s): Respiratory Distress, Unknown   Sulfa Antibiotics Other (See Comments), Itching   Yeast infection/itching   Dilaudid [hydromorphone Hcl] Other (See Comments)   Changed respirations   Irbesartan Swelling   Keflex [cephalexin] Other (See Comments)   Yeast infection/itching   Metformin Hcl Diarrhea   Sulindac Other (See Comments)   Yeast infection/itching Other reaction(s): Unknown   Tramadol Itching   itching        Medication List     TAKE these medications    acetaminophen-caffeine 500-65 MG Tabs per tablet Commonly known as: EXCEDRIN TENSION HEADACHE Take 1 tablet by mouth as needed (headache, pain).   ALPRAZolam 0.25 MG tablet Commonly known as: XANAX Take 0.25 mg by mouth daily as needed (afib/panic attacks).   atorvastatin 20 MG tablet Commonly known as: LIPITOR Take 20 mg by mouth at bedtime.   Blink Tears 0.25 % Soln Generic drug: Polyethylene Glycol 400 Place 1 drop into both eyes daily as needed (dry eyes).   buPROPion 300 MG 24 hr tablet Commonly known as: WELLBUTRIN XL Take 300 mg by mouth every morning.   cetirizine 10 MG tablet Commonly known as: ZYRTEC Take 1 tablet (10 mg total) by mouth daily.   diclofenac Sodium 1 % Gel Commonly known as: VOLTAREN Apply 2-4 g topically daily as needed (Lt leg pain.).   diltiazem 180 MG 24 hr capsule Commonly known as: CARDIZEM CD TAKE 1 CAPSULE(180 MG) BY MOUTH DAILY   famotidine 20 MG tablet Commonly known as: PEPCID TAKE 1 TABLET(20 MG) BY MOUTH DAILY   flecainide 50 MG tablet Commonly known as: TAMBOCOR TAKE 1 TABLET(50 MG) BY MOUTH TWICE  DAILY   furosemide 20 MG tablet Commonly known as: LASIX Take 1 tablet (20 mg total) by mouth every morning.   HYDROcodone-acetaminophen 5-325 MG tablet Commonly known as: NORCO/VICODIN Take 1 tablet by mouth every 6 (six) hours as needed for severe pain.   Jardiance 25 MG Tabs tablet Generic drug: empagliflozin Take 1 tablet by mouth daily.   methylphenidate 10 MG tablet Commonly known as: RITALIN Take 10 mg by mouth as needed (narcoleptic episodes when driving long distances.).   metoprolol succinate 100 MG 24 hr tablet Commonly known as: TOPROL-XL Take 1 tablet (100 mg total) by mouth daily. Please call [phone] to schedule a March appointment for future refills. Thank you.   omeprazole 20 MG tablet Commonly known as: PRILOSEC OTC Take 20 mg by mouth at bedtime.   Ozempic (0.25 or 0.5 MG/DOSE) 2 MG/3ML Sopn Generic drug: Semaglutide(0.25 or 0.5MG /DOS) Inject 1 mg into the skin once a week.   potassium chloride 10 MEQ tablet Commonly known as: KLOR-CON M Take 10 mEq by mouth daily.   rivaroxaban 20 MG Tabs tablet Commonly known as: Xarelto Take 1 tablet (20 mg total) by mouth daily with supper.   rOPINIRole 2  MG tablet Commonly known as: REQUIP Take 1-2 mg by mouth See admin instructions. Take 1/2 tablet (1 mg) by mouth with supper as needed for restless legs, take 1 tablet (2 mg) daily at bedtime   sulfamethoxazole-trimethoprim 800-160 MG tablet Commonly known as: BACTRIM DS Take 1 tablet by mouth 2 (two) times daily.   tiZANidine 2 MG tablet Commonly known as: ZANAFLEX Take 2 mg by mouth 3 (three) times daily as needed.   Trospium Chloride 60 MG Cp24 TAKE 1 CAPSULE(60 MG) BY MOUTH DAILY   Vilazodone HCl 40 MG Tabs Commonly known as: VIIBRYD Take 40 mg by mouth daily.   Vitamin D-3 125 MCG (5000 UT) Tabs Take 5,000 Units by mouth at bedtime.        Discharge Exam: Today's Vitals   06/30/23 0615 06/30/23 0642 06/30/23 1131 06/30/23 1300  BP:    115/63 136/68  Pulse: 71  66 69  Resp: 20  15 19   Temp:  98 F (36.7 C) 98.5 F (36.9 C)   TempSrc:  Oral Oral   SpO2: 97%  94% 98%  PainSc:         Condition at discharge: good  The results of significant diagnostics from this hospitalization (including imaging, microbiology, ancillary and laboratory) are listed below for reference.   Imaging Studies: ECHOCARDIOGRAM LIMITED Result Date: 06/30/2023    ECHOCARDIOGRAM LIMITED REPORT   Patient Name:   REILYNN LAURO Date of Exam: 06/30/2023 Medical Rec #:  [id number]       Height:       63.0 in Accession #:    [id number]      Weight:       259.0 lb Date of Birth:  1955/06/22        BSA:          2.158 m Patient Age:    67 years        BP:           113/63 mmHg Patient Gender: F               HR:           71 bpm. Exam Location:  Inpatient Procedure: 2D Echo, Limited Echo, Cardiac Doppler and Color Doppler (Both            Spectral and Color Flow Doppler were utilized during procedure). Indications:    I50.9* Heart failure (unspecified)  History:        Patient has prior history of Echocardiogram examinations, most                 recent 04/25/2023. Arrythmias:Atrial Fibrillation and LBBB; Risk                 Factors:Hypertension and Diabetes.  Sonographer:    Maxwell Marion Referring Phys: [id number] MAHESH A CHANDRASEKHAR IMPRESSIONS  1. Limited echo for LVEF  2. Left ventricular ejection fraction, by estimation, is 55 to 60%. Left ventricular ejection fraction by 2D MOD biplane is 59.5 %. The left ventricle has normal function. The left ventricle has no regional wall motion abnormalities. Left ventricular diastolic parameters are consistent with Grade I diastolic dysfunction (impaired relaxation).  3. Right ventricular systolic function is normal. The right ventricular size is normal.  4. The aortic valve is tricuspid. Aortic valve regurgitation is not visualized.  5. The inferior vena cava is normal in size with greater than 50% respiratory variability,  suggesting right atrial pressure of 3 mmHg. Comparison(s): Changes from prior study  are noted. 04/25/2023: LVEF 40-45%. FINDINGS  Left Ventricle: Left ventricular ejection fraction, by estimation, is 55 to 60%. Left ventricular ejection fraction by 2D MOD biplane is 59.5 %. The left ventricle has normal function. The left ventricle has no regional wall motion abnormalities. Definity contrast agent was given IV to delineate the left ventricular endocardial borders. Abnormal (paradoxical) septal motion, consistent with left bundle branch block. Left ventricular diastolic parameters are consistent with Grade I diastolic dysfunction (impaired relaxation). Indeterminate filling pressures. Right Ventricle: The right ventricular size is normal. No increase in right ventricular wall thickness. Right ventricular systolic function is normal. Aortic Valve: The aortic valve is tricuspid. Aortic valve regurgitation is not visualized. Pulmonic Valve: The pulmonic valve was normal in structure. Pulmonic valve regurgitation is not visualized. Venous: The inferior vena cava is normal in size with greater than 50% respiratory variability, suggesting right atrial pressure of 3 mmHg.  LV Volumes (MOD)               Biplane EF (MOD) LV vol d, MOD    143.0 ml      LV Biplane EF:   Left A2C:                                            ventricular LV vol d, MOD    87.3 ml                        ejection A4C:                                            fraction by LV vol s, MOD    52.8 ml                        2D MOD A2C:                                            biplane is LV vol s, MOD    39.1 ml                        59.5 %. A4C: LV SV MOD A2C:   90.2 ml       Diastology LV SV MOD A4C:   87.3 ml       LV e' medial:    6.09 cm/s LV SV MOD BP:    68.9 ml       LV E/e' medial:  11.5                                LV e' lateral:   7.72 cm/s                                LV E/e' lateral: 9.1  RIGHT VENTRICLE RV S prime:     11.40 cm/s TAPSE  (M-mode): 1.2 cm MITRAL VALVE MV Area (PHT): 4.01 cm MV Decel Time: 189 msec MV E velocity: 70.30 cm/s MV  A velocity: 60.80 cm/s MV E/A ratio:  1.16 Zoila Shutter MD Electronically signed by Zoila Shutter MD Signature Date/Time: 06/30/2023/1:59:29 PM    Final    DG Chest Port 1 View Result Date: 06/29/2023 CLINICAL DATA:  Atrial fibrillation EXAM: PORTABLE CHEST 1 VIEW COMPARISON:  None Available. FINDINGS: The heart size and mediastinal contours are within normal limits. Both lungs are clear. The visualized skeletal structures are unremarkable. IMPRESSION: No active disease. Electronically Signed   By: Helyn Numbers M.D.   On: 06/29/2023 23:47    Microbiology: Results for orders placed or performed during the hospital encounter of 09/19/20  SARS CORONAVIRUS 2 (TAT 6-24 HRS) Nasopharyngeal Nasopharyngeal Swab     Status: None   Collection Time: 09/20/20 12:15 AM   Specimen: Nasopharyngeal Swab  Result Value Ref Range Status   SARS Coronavirus 2 NEGATIVE NEGATIVE Final    Comment: (NOTE) SARS-CoV-2 target nucleic acids are NOT DETECTED.  The SARS-CoV-2 RNA is generally detectable in upper and lower respiratory specimens during the acute phase of infection. Negative results do not preclude SARS-CoV-2 infection, do not rule out co-infections with other pathogens, and should not be used as the sole basis for treatment or other patient management decisions. Negative results must be combined with clinical observations, patient history, and epidemiological information. The expected result is Negative.  Fact Sheet for Patients: HairSlick.no  Fact Sheet for Healthcare Providers: quierodirigir.com  This test is not yet approved or cleared by the [place] FDA and  has been authorized for detection and/or diagnosis of SARS-CoV-2 by FDA under an Emergency Use Authorization (EUA). This EUA will remain  in effect (meaning this test can be  used) for the duration of the COVID-19 declaration under Se ction 564(b)(1) of the Act, 21 U.S.C. section 360bbb-3(b)(1), unless the authorization is terminated or revoked sooner.  Performed at [facility], [address] [place], [place] [address]   Gastrointestinal Panel by PCR , Stool     Status: Abnormal   Collection Time: 09/20/20  3:10 AM   Specimen: Stool  Result Value Ref Range Status   Campylobacter species DETECTED (A) NOT DETECTED Final    Comment: RESULT CALLED TO, READ BACK BY AND VERIFIED WITH: NICHOLA GAUNTLETT AT 1742 09/20/20.PMF    Plesimonas shigelloides NOT DETECTED NOT DETECTED Final   Salmonella species NOT DETECTED NOT DETECTED Final   Yersinia enterocolitica NOT DETECTED NOT DETECTED Final   Vibrio species NOT DETECTED NOT DETECTED Final   Vibrio cholerae NOT DETECTED NOT DETECTED Final   Enteroaggregative E coli (EAEC) NOT DETECTED NOT DETECTED Final   Enteropathogenic E coli (EPEC) NOT DETECTED NOT DETECTED Final   Enterotoxigenic E coli (ETEC) NOT DETECTED NOT DETECTED Final   Shiga like toxin producing E coli (STEC) NOT DETECTED NOT DETECTED Final   Shigella/Enteroinvasive E coli (EIEC) NOT DETECTED NOT DETECTED Final   Cryptosporidium NOT DETECTED NOT DETECTED Final   Cyclospora cayetanensis NOT DETECTED NOT DETECTED Final   Entamoeba histolytica NOT DETECTED NOT DETECTED Final   Giardia lamblia NOT DETECTED NOT DETECTED Final   Adenovirus F40/41 NOT DETECTED NOT DETECTED Final   Astrovirus NOT DETECTED NOT DETECTED Final   Norovirus GI/GII NOT DETECTED NOT DETECTED Final   Rotavirus A NOT DETECTED NOT DETECTED Final   Sapovirus (I, II, IV, and V) NOT DETECTED NOT DETECTED Final    Comment: Performed at [facility], [address] [place], [place] [address]  C Difficile Quick Screen w PCR reflex  Status: None   Collection Time: 09/20/20 11:36 AM   Specimen: STOOL  Result Value Ref Range Status   C Diff antigen NEGATIVE  NEGATIVE Final   C Diff toxin NEGATIVE NEGATIVE Final   C Diff interpretation No C. difficile detected.  Final    Comment: Performed at [facility], [address], [place], [place] [address]    Labs: CBC: Recent Labs  Lab 06/29/23 2331 06/30/23 0332  WBC 8.9 9.6  NEUTROABS  --  6.7  HGB 15.9* 15.3*  HCT 49.5* 46.6*  MCV 88.1 86.5  PLT 285 276   Basic Metabolic Panel: Recent Labs  Lab 06/29/23 2331 06/30/23 0332  NA 136 137  K 3.7 4.2  CL 103 104  CO2 24 22  GLUCOSE 331* 350*  BUN 19 16  CREATININE 0.88 0.85  CALCIUM 8.8* 8.8*  MG 1.8  --    Liver Function Tests: Recent Labs  Lab 06/30/23 0332  AST 26  ALT 23  ALKPHOS 66  BILITOT 1.2  PROT 5.7*  ALBUMIN 3.1*   CBG: Recent Labs  Lab 06/30/23 1128  GLUCAP 272*    Discharge time spent: less than 30 minutes.  Signed: Clydie Braun, MD [facility] 06/30/2023

## 2023-06-30 NOTE — ED Notes (Signed)
 Updated EDP regarding current HR with rate dose change of cardizem ggt at 20 mg/hr. Rate 140-150. No new orders
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
## 2023-06-30 NOTE — ED Notes (Signed)
 Pt reports wanting to sleep. Requesting a CPAP she uses at home. Verbal order obtained from admitting provider Dr. Arlean Hopping. RT called for request.

## 2023-06-30 NOTE — Progress Notes (Signed)
 TRH update:   I was notified by pt's RN that the patient has converted from A fib RVR to sinus rhythm with HR"s in the 80's - 90's. I have subsequently resumed the patient's home metoprolol succinate 100 mg p.o. daily, first dose now, following which, will begin process of weaning off of existing dilt drip.  I have also ordered an updated EKG to document the spontaneous conversion to sinus rhythm.  Second troponin she has very mild elevation.  Presentation has not been associated with any chest pain.  I have ordered a third set of troponin to be checked later this morning.     Newton Pigg, DO Hospitalist

## 2023-06-30 NOTE — Progress Notes (Signed)
  Echocardiogram 2D Echocardiogram has been performed.  Rosemary Holms,  RDCS 06/30/2023, 11:09 AM

## 2023-06-30 NOTE — ED Notes (Signed)
 Discussed verbal orders by admitting provider to wean cardizem gtt from pt with ed pharmacy [facility] who recommended decreasing gtt by 5 mg every 15 min

## 2023-06-30 NOTE — ED Notes (Signed)
 EKG obtained after noticing pt had converted to NSR. Admitting provider Howerter informed of change. Verbal order to wean down Cardizem drip and po metoprolol ordered.

## 2023-06-30 NOTE — ED Notes (Signed)
 Verbal order obtained from EDP. Rate Dose Change Cardizem to 20 mg/hr.   System Downtime Back Chart

## 2023-06-30 NOTE — Plan of Care (Addendum)
 Returned to evaluate patient due to conversion to sinus rhythm  Key event: Overnight- patient (likely self converted to SR).  Seen by Dr. Jean Rosenthal overnight.  With diltiazem drip, last heart rate 70. Despite trial of her home flecainide. Was still in AF PTA.    Patient notes that this is related to her not talking her medications as prescribed due to life events . No chest pain or pressure .  No SOB/DOE and no PND/Orthopnea.  No weight gain or leg swelling.  No palpitations or syncope.  BG 350 - she reviews her diet with me.  All of the carbohydrates she has been offered, she is concerned they are the cause of this.  She tried to eat limited breakfast for this reason.  Key diagnostics. - LVEF mildly decreased ~ 40% with abnormal septal motion - this was 04/25/23 when she was in SR. - EKG SR with LBBB, QTC prolonged by Jtc WNL.  AP:  PAF - Will reach out to echo- Limited Echo for Chronic HFrEF (Appears to be dyssynchrony related from prior study) - if confirmed to be LVEF < 50% will stop flecainide; may start PO amiodarone as a bridge to ablation discussion with her primary EP Or DC on metoprolol succinate 100 mg PO BID and no diltiazem - if LVEF is > 50%, will continue diltiazem, flecainide, and metoprolol - in either case, continue CPAP use for OSA; outpatient consideration of GLP1-RA therapy would likely improved rhythm control - DM- asked to get her different diet; SSI ordered, updated TRH team  Coordination plan:  - reviewed care with patient's EP, nursing, echo, TRH  Time spent 34 minutes  Riley Lam, MD FASE [facility] Cardiologist [facility]  [facility]  [address] [place], [address] [place], [place] [address] [phone]  8:36 AM  Recovery in  LVEF, normal. This study is a contrast study (prior was not). She notes she missed her medications, and because of this went into AF RVR. She is in SR. From a cardiac perspective she could be discharged with AF clinic follow up  today.  Has 07/14/23 visit.  Riley Lam, MD FASE [facility] Cardiologist [facility]  [address] [place], [address] [place], [place] [address] [phone]  2:02 PM
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
## 2023-06-30 NOTE — ED Notes (Signed)
 Cardiology at bedside  System Downtime Back Chart

## 2023-07-02 ENCOUNTER — Other Ambulatory Visit: Payer: Self-pay

## 2023-07-02 ENCOUNTER — Emergency Department (HOSPITAL_BASED_OUTPATIENT_CLINIC_OR_DEPARTMENT_OTHER)

## 2023-07-02 ENCOUNTER — Encounter (HOSPITAL_BASED_OUTPATIENT_CLINIC_OR_DEPARTMENT_OTHER): Payer: Self-pay

## 2023-07-02 ENCOUNTER — Other Ambulatory Visit: Payer: Self-pay | Admitting: Cardiology

## 2023-07-02 ENCOUNTER — Observation Stay (HOSPITAL_BASED_OUTPATIENT_CLINIC_OR_DEPARTMENT_OTHER)
Admission: EM | Admit: 2023-07-02 | Discharge: 2023-07-03 | Disposition: A | Discharge status: Home / Self Care | Attending: Internal Medicine | Admitting: Internal Medicine

## 2023-07-02 DIAGNOSIS — F419 Anxiety disorder, unspecified: Secondary | ICD-10-CM | POA: Insufficient documentation

## 2023-07-02 DIAGNOSIS — I5042 Chronic combined systolic (congestive) and diastolic (congestive) heart failure: Secondary | ICD-10-CM | POA: Insufficient documentation

## 2023-07-02 DIAGNOSIS — R062 Wheezing: Secondary | ICD-10-CM | POA: Diagnosis present

## 2023-07-02 DIAGNOSIS — E785 Hyperlipidemia, unspecified: Secondary | ICD-10-CM | POA: Insufficient documentation

## 2023-07-02 DIAGNOSIS — F1090 Alcohol use, unspecified, uncomplicated: Secondary | ICD-10-CM | POA: Insufficient documentation

## 2023-07-02 DIAGNOSIS — Z1152 Encounter for screening for COVID-19: Secondary | ICD-10-CM | POA: Diagnosis not present

## 2023-07-02 DIAGNOSIS — I480 Paroxysmal atrial fibrillation: Secondary | ICD-10-CM | POA: Diagnosis not present

## 2023-07-02 DIAGNOSIS — F32A Depression, unspecified: Secondary | ICD-10-CM | POA: Diagnosis present

## 2023-07-02 DIAGNOSIS — E1143 Type 2 diabetes mellitus with diabetic autonomic (poly)neuropathy: Secondary | ICD-10-CM | POA: Diagnosis not present

## 2023-07-02 DIAGNOSIS — R198 Other specified symptoms and signs involving the digestive system and abdomen: Secondary | ICD-10-CM | POA: Diagnosis not present

## 2023-07-02 DIAGNOSIS — D751 Secondary polycythemia: Secondary | ICD-10-CM | POA: Diagnosis present

## 2023-07-02 DIAGNOSIS — R0602 Shortness of breath: Secondary | ICD-10-CM | POA: Diagnosis present

## 2023-07-02 DIAGNOSIS — D750 Familial erythrocytosis: Secondary | ICD-10-CM | POA: Insufficient documentation

## 2023-07-02 DIAGNOSIS — J14 Pneumonia due to Hemophilus influenzae: Secondary | ICD-10-CM | POA: Diagnosis not present

## 2023-07-02 DIAGNOSIS — Z794 Long term (current) use of insulin: Secondary | ICD-10-CM | POA: Insufficient documentation

## 2023-07-02 DIAGNOSIS — J9601 Acute respiratory failure with hypoxia: Secondary | ICD-10-CM | POA: Diagnosis not present

## 2023-07-02 DIAGNOSIS — R0902 Hypoxemia: Secondary | ICD-10-CM | POA: Diagnosis not present

## 2023-07-02 DIAGNOSIS — K219 Gastro-esophageal reflux disease without esophagitis: Secondary | ICD-10-CM | POA: Insufficient documentation

## 2023-07-02 DIAGNOSIS — J189 Pneumonia, unspecified organism: Secondary | ICD-10-CM | POA: Diagnosis present

## 2023-07-02 DIAGNOSIS — I110 Hypertensive heart disease with heart failure: Secondary | ICD-10-CM | POA: Diagnosis not present

## 2023-07-02 DIAGNOSIS — I10 Essential (primary) hypertension: Secondary | ICD-10-CM | POA: Diagnosis present

## 2023-07-02 DIAGNOSIS — E119 Type 2 diabetes mellitus without complications: Secondary | ICD-10-CM

## 2023-07-02 DIAGNOSIS — G4733 Obstructive sleep apnea (adult) (pediatric): Secondary | ICD-10-CM | POA: Diagnosis not present

## 2023-07-02 DIAGNOSIS — Z79899 Other long term (current) drug therapy: Secondary | ICD-10-CM | POA: Insufficient documentation

## 2023-07-02 LAB — COMPREHENSIVE METABOLIC PANEL WITH GFR
ALT: 32 U/L (ref 0–44)
AST: 26 U/L (ref 15–41)
Albumin: 3.4 g/dL — ABNORMAL LOW (ref 3.5–5.0)
Alkaline Phosphatase: 74 U/L (ref 38–126)
Anion gap: 12 (ref 5–15)
BUN: 23 mg/dL (ref 8–23)
CO2: 23 mmol/L (ref 22–32)
Calcium: 9.3 mg/dL (ref 8.9–10.3)
Chloride: 100 mmol/L (ref 98–111)
Creatinine, Ser: 1.05 mg/dL — ABNORMAL HIGH (ref 0.44–1.00)
GFR, Estimated: 59 mL/min — ABNORMAL LOW (ref >60)
Glucose, Bld: 291 mg/dL — ABNORMAL HIGH (ref 70–99)
Potassium: 4 mmol/L (ref 3.5–5.1)
Sodium: 135 mmol/L (ref 135–145)
Total Bilirubin: 1 mg/dL (ref 0.0–1.2)
Total Protein: 6.6 g/dL (ref 6.5–8.1)

## 2023-07-02 LAB — RESPIRATORY PANEL BY PCR

## 2023-07-02 LAB — BLOOD GAS, VENOUS
Acid-Base Excess: 4.1 mmol/L — ABNORMAL HIGH (ref 0.0–2.0)
Bicarbonate: 28.5 mmol/L — ABNORMAL HIGH (ref 20.0–28.0)
Drawn by: 5267
O2 Saturation: 89.3 %
Patient temperature: 36.9
pCO2, Ven: 41 mmHg — ABNORMAL LOW (ref 44–60)
pH, Ven: 7.45 — ABNORMAL HIGH (ref 7.25–7.43)
pO2, Ven: 56 mmHg — ABNORMAL HIGH (ref 32–45)

## 2023-07-02 LAB — CBC WITH DIFFERENTIAL/PLATELET
Abs Immature Granulocytes: 0.04 10*3/uL (ref 0.00–0.07)
Basophils Absolute: 0.1 10*3/uL (ref 0.0–0.1)
Basophils Relative: 1 %
Eosinophils Absolute: 0.2 10*3/uL (ref 0.0–0.5)
Eosinophils Relative: 2 %
HCT: 47.8 % — ABNORMAL HIGH (ref 36.0–46.0)
Hemoglobin: 15.6 g/dL — ABNORMAL HIGH (ref 12.0–15.0)
Immature Granulocytes: 1 %
Lymphocytes Relative: 21 %
Lymphs Abs: 1.7 10*3/uL (ref 0.7–4.0)
MCH: 28.1 pg (ref 26.0–34.0)
MCHC: 32.6 g/dL (ref 30.0–36.0)
MCV: 86 fL (ref 80.0–100.0)
Monocytes Absolute: 0.6 10*3/uL (ref 0.1–1.0)
Monocytes Relative: 7 %
Neutro Abs: 5.5 10*3/uL (ref 1.7–7.7)
Neutrophils Relative %: 68 %
Platelets: 299 10*3/uL (ref 150–400)
RBC: 5.56 MIL/uL — ABNORMAL HIGH (ref 3.87–5.11)
RDW: 16.5 % — ABNORMAL HIGH (ref 11.5–15.5)
WBC: 8 10*3/uL (ref 4.0–10.5)
nRBC: 0 % (ref 0.0–0.2)

## 2023-07-02 LAB — BRAIN NATRIURETIC PEPTIDE: B Natriuretic Peptide: 58.2 pg/mL (ref 0.0–100.0)

## 2023-07-02 LAB — RESP PANEL BY RT-PCR (RSV, FLU A&B, COVID)  RVPGX2
Influenza A by PCR: NEGATIVE
Influenza B by PCR: NEGATIVE
Resp Syncytial Virus by PCR: NEGATIVE
SARS Coronavirus 2 by RT PCR: NEGATIVE

## 2023-07-02 LAB — GLUCOSE, CAPILLARY
Glucose-Capillary: 252 mg/dL — ABNORMAL HIGH (ref 70–99)
Glucose-Capillary: 278 mg/dL — ABNORMAL HIGH (ref 70–99)

## 2023-07-02 LAB — STREP PNEUMONIAE URINARY ANTIGEN: Strep Pneumo Urinary Antigen: NEGATIVE

## 2023-07-02 LAB — MAGNESIUM: Magnesium: 1.9 mg/dL (ref 1.7–2.4)

## 2023-07-02 LAB — TROPONIN I (HIGH SENSITIVITY): Troponin I (High Sensitivity): 9 ng/L (ref <18)

## 2023-07-02 LAB — PROCALCITONIN: Procalcitonin: <0.1 ng/mL

## 2023-07-02 LAB — TSH: TSH: 0.815 u[IU]/mL (ref 0.350–4.500)

## 2023-07-02 MED ORDER — IOHEXOL 350 MG/ML SOLN
100.0000 mL | Freq: Once | INTRAVENOUS | Status: AC | PRN
  Administered 2023-07-02: 100 mL via INTRAVENOUS

## 2023-07-02 MED ORDER — MUPIROCIN 2 % EX OINT
TOPICAL_OINTMENT | Freq: Three times a day (TID) | CUTANEOUS | Status: DC
  Filled 2023-07-02: qty 22

## 2023-07-02 MED ORDER — IPRATROPIUM-ALBUTEROL 0.5-2.5 (3) MG/3ML IN SOLN
3.0000 mL | Freq: Once | RESPIRATORY_TRACT | Status: AC
  Administered 2023-07-02: 3 mL via RESPIRATORY_TRACT
  Filled 2023-07-02: qty 3

## 2023-07-02 MED ORDER — DILTIAZEM HCL ER COATED BEADS 180 MG PO CP24
180.0000 mg | ORAL_CAPSULE | Freq: Every day | ORAL | Status: DC
  Administered 2023-07-03: 180 mg via ORAL
  Filled 2023-07-02: qty 1

## 2023-07-02 MED ORDER — ROPINIROLE HCL 1 MG PO TABS: 1.0000 mg | ORAL_TABLET | Freq: Every day | ORAL | Status: DC | PRN

## 2023-07-02 MED ORDER — METOPROLOL SUCCINATE ER 50 MG PO TB24: 100.0000 mg | ORAL_TABLET | Freq: Every day | ORAL | Status: DC

## 2023-07-02 MED ORDER — POTASSIUM CHLORIDE CRYS ER 10 MEQ PO TBCR
10.0000 meq | EXTENDED_RELEASE_TABLET | Freq: Every day | ORAL | Status: DC
Start: 2023-07-03 — End: 2023-07-04
  Administered 2023-07-03: 10 meq via ORAL
  Filled 2023-07-02: qty 1

## 2023-07-02 MED ORDER — FESOTERODINE FUMARATE ER 4 MG PO TB24
4.0000 mg | ORAL_TABLET | Freq: Every day | ORAL | Status: DC
  Administered 2023-07-03: 4 mg via ORAL
  Filled 2023-07-02: qty 1

## 2023-07-02 MED ORDER — LEVALBUTEROL HCL 0.63 MG/3ML IN NEBU: 0.6300 mg | INHALATION_SOLUTION | Freq: Four times a day (QID) | RESPIRATORY_TRACT | Status: DC | PRN

## 2023-07-02 MED ORDER — ACETAMINOPHEN 325 MG PO TABS
650.0000 mg | ORAL_TABLET | Freq: Four times a day (QID) | ORAL | Status: DC | PRN
  Administered 2023-07-02: 650 mg via ORAL
  Filled 2023-07-02: qty 2

## 2023-07-02 MED ORDER — INSULIN ASPART 100 UNIT/ML IJ SOLN
0.0000 [IU] | Freq: Three times a day (TID) | INTRAMUSCULAR | Status: DC
  Administered 2023-07-02: 8 [IU] via SUBCUTANEOUS
  Administered 2023-07-03: 3 [IU] via SUBCUTANEOUS
  Administered 2023-07-03: 8 [IU] via SUBCUTANEOUS
  Administered 2023-07-03: 5 [IU] via SUBCUTANEOUS

## 2023-07-02 MED ORDER — ROPINIROLE HCL 1 MG PO TABS
2.0000 mg | ORAL_TABLET | Freq: Every day | ORAL | Status: DC
  Administered 2023-07-02: 2 mg via ORAL
  Filled 2023-07-02: qty 2

## 2023-07-02 MED ORDER — ATORVASTATIN CALCIUM 10 MG PO TABS
20.0000 mg | ORAL_TABLET | Freq: Every day | ORAL | Status: DC
  Administered 2023-07-02: 20 mg via ORAL
  Filled 2023-07-02: qty 2

## 2023-07-02 MED ORDER — VILAZODONE HCL 20 MG PO TABS
40.0000 mg | ORAL_TABLET | Freq: Every day | ORAL | Status: DC
  Administered 2023-07-03: 40 mg via ORAL
  Filled 2023-07-02: qty 2

## 2023-07-02 MED ORDER — ONDANSETRON HCL 4 MG PO TABS: 4.0000 mg | ORAL_TABLET | Freq: Four times a day (QID) | ORAL | Status: DC | PRN

## 2023-07-02 MED ORDER — ROPINIROLE HCL 1 MG PO TABS
1.0000 mg | ORAL_TABLET | ORAL | Status: DC
Start: 2023-07-02 — End: 2023-07-02

## 2023-07-02 MED ORDER — INSULIN GLARGINE-YFGN 100 UNIT/ML ~~LOC~~ SOLN
5.0000 [IU] | Freq: Every day | SUBCUTANEOUS | Status: DC
  Administered 2023-07-02 – 2023-07-03 (×2): 5 [IU] via SUBCUTANEOUS
  Filled 2023-07-02 (×2): qty 0.05

## 2023-07-02 MED ORDER — DOXYCYCLINE HYCLATE 100 MG PO TABS
100.0000 mg | ORAL_TABLET | Freq: Once | ORAL | Status: AC
  Administered 2023-07-02: 100 mg via ORAL
  Filled 2023-07-02: qty 1

## 2023-07-02 MED ORDER — TROSPIUM CHLORIDE ER 60 MG PO CP24: 60.0000 mg | ORAL_CAPSULE | Freq: Every day | ORAL | Status: DC

## 2023-07-02 MED ORDER — RIVAROXABAN 20 MG PO TABS
20.0000 mg | ORAL_TABLET | Freq: Every day | ORAL | Status: DC
  Administered 2023-07-03: 20 mg via ORAL
  Filled 2023-07-02: qty 1

## 2023-07-02 MED ORDER — SODIUM CHLORIDE 0.9 % IV SOLN
2.0000 g | INTRAVENOUS | Status: DC
  Administered 2023-07-03: 2 g via INTRAVENOUS
  Filled 2023-07-02: qty 20

## 2023-07-02 MED ORDER — ONDANSETRON HCL 4 MG/2ML IJ SOLN: 4.0000 mg | Freq: Four times a day (QID) | INTRAMUSCULAR | Status: DC | PRN

## 2023-07-02 MED ORDER — FAMOTIDINE 20 MG PO TABS
20.0000 mg | ORAL_TABLET | Freq: Every day | ORAL | Status: DC
  Administered 2023-07-02: 20 mg via ORAL
  Filled 2023-07-02: qty 1

## 2023-07-02 MED ORDER — FLECAINIDE ACETATE 50 MG PO TABS
50.0000 mg | ORAL_TABLET | Freq: Two times a day (BID) | ORAL | Status: DC
  Administered 2023-07-02 – 2023-07-03 (×2): 50 mg via ORAL
  Filled 2023-07-02 (×3): qty 1

## 2023-07-02 MED ORDER — SODIUM CHLORIDE 0.9 % IV SOLN
1.0000 g | Freq: Once | INTRAVENOUS | Status: AC
  Administered 2023-07-02: 1 g via INTRAVENOUS
  Filled 2023-07-02: qty 10

## 2023-07-02 MED ORDER — LORATADINE 10 MG PO TABS
10.0000 mg | ORAL_TABLET | Freq: Every day | ORAL | Status: DC
  Administered 2023-07-02 – 2023-07-03 (×2): 10 mg via ORAL
  Filled 2023-07-02 (×2): qty 1

## 2023-07-02 MED ORDER — BUPROPION HCL ER (XL) 300 MG PO TB24
300.0000 mg | ORAL_TABLET | Freq: Every morning | ORAL | Status: DC
  Administered 2023-07-03: 300 mg via ORAL
  Filled 2023-07-02: qty 1

## 2023-07-02 MED ORDER — AZITHROMYCIN 250 MG PO TABS
500.0000 mg | ORAL_TABLET | Freq: Every day | ORAL | Status: AC
  Administered 2023-07-02: 500 mg via ORAL
  Filled 2023-07-02: qty 2

## 2023-07-02 MED ORDER — FUROSEMIDE 20 MG PO TABS
20.0000 mg | ORAL_TABLET | Freq: Every morning | ORAL | Status: DC
  Administered 2023-07-03: 20 mg via ORAL
  Filled 2023-07-02: qty 1

## 2023-07-02 MED ORDER — METOPROLOL SUCCINATE ER 50 MG PO TB24
50.0000 mg | ORAL_TABLET | Freq: Every day | ORAL | Status: DC
  Administered 2023-07-02: 50 mg via ORAL
  Filled 2023-07-02 (×2): qty 1

## 2023-07-02 MED ORDER — MAGNESIUM OXIDE -MG SUPPLEMENT 400 (240 MG) MG PO TABS
400.0000 mg | ORAL_TABLET | Freq: Every day | ORAL | Status: DC
  Administered 2023-07-03: 400 mg via ORAL
  Filled 2023-07-02: qty 1

## 2023-07-02 MED ORDER — AZITHROMYCIN 250 MG PO TABS
250.0000 mg | ORAL_TABLET | Freq: Every day | ORAL | Status: DC
  Administered 2023-07-03: 250 mg via ORAL
  Filled 2023-07-02: qty 1

## 2023-07-02 MED ORDER — EMPAGLIFLOZIN 25 MG PO TABS
25.0000 mg | ORAL_TABLET | Freq: Every day | ORAL | Status: DC
  Administered 2023-07-03: 25 mg via ORAL
  Filled 2023-07-02: qty 1

## 2023-07-02 MED ORDER — OMEPRAZOLE 20 MG PO CPDR
20.0000 mg | DELAYED_RELEASE_CAPSULE | Freq: Every day | ORAL | Status: DC
  Administered 2023-07-02: 20 mg via ORAL
  Filled 2023-07-02 (×2): qty 1

## 2023-07-02 NOTE — ED Notes (Signed)
 Patient transported to X-ray
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
## 2023-07-02 NOTE — Assessment & Plan Note (Deleted)
 68 year old female presenting to ED with shortness of breath and hypoxia with oxygen to 74% on room air while exercising at home and found to have oxygen in the 80s in triage and put on 4L oxygen [place] to maintain saturation likely secondary to atypical pneumonia  -admit to tele  -CTA chest showing diffuse ground glass airspace densities throughout the right lung consistent with atypical pneumonia. No PE. Rest of work up unremarkable -bnp, troponin wnl  -vbg with more respiratory alkalosis after being on oxygen -check RVP -covid/flu/RSV negative  -check urinary antigens -BC obtained, no criteria for SIRS/sepsis -continue rocephin and azithromycin. No QT prolongation on EKG and has LBBB which will increase Qtc.  -trend PCT -xoponex PRN if needed  -wean oxygen as tolerated, now down to 2L -continue cpap at night

## 2023-07-02 NOTE — Assessment & Plan Note (Signed)
 Has had some transient hypotension per chart review Watch closely Continue home cardizem, metoprolol for rate control  Continue lasix daily

## 2023-07-02 NOTE — ED Notes (Signed)
 Went into afib RVR on Wednesday and was converted after 11 hours. Woke up this AM to exercise and couldn't get a deep breath. Found her O2 on pulse ox was in the 70s.

## 2023-07-02 NOTE — ED Provider Notes (Signed)
 [facility] Provider Note   CSN: [id number] Arrival date & time: 07/02/23  1102     History  Chief Complaint  Patient presents with   Shortness of Breath    Kristin [name] is a 68 y.o. female.  HPI 68 year old female presents with shortness of breath.  This morning after doing some exercises she felt short of breath.  Her O2 sat was reading in the 70s.  She wears CPAP at night but no oxygen during the day.  Her chronic cough is a little worse over the last couple days.  No fevers or chest pain.  No leg swelling.  She feels like she has had some congestion in her throat.  Home Medications Prior to Admission medications   Medication Sig Start Date End Date Taking? Authorizing Provider  acetaminophen-caffeine (EXCEDRIN TENSION HEADACHE) 500-65 MG TABS per tablet Take 1 tablet by mouth as needed (headache, pain).    [provider]  ALPRAZolam Prudy Feeler) 0.25 MG tablet Take 0.25 mg by mouth daily as needed (afib/panic attacks). 08/14/19   [provider]  atorvastatin (LIPITOR) 20 MG tablet Take 20 mg by mouth at bedtime. 03/02/19   [provider]  buPROPion (WELLBUTRIN XL) 300 MG 24 hr tablet Take 300 mg by mouth every morning. 05/24/23   [provider]  cetirizine (ZYRTEC) 10 MG tablet Take 1 tablet (10 mg total) by mouth daily. 01/27/23   Marcelyn Bruins, MD  Cholecalciferol (VITAMIN D-3) 125 MCG (5000 UT) TABS Take 5,000 Units by mouth at bedtime.    [provider]  diclofenac Sodium (VOLTAREN) 1 % GEL Apply 2-4 g topically daily as needed (Lt leg pain.).    [provider]  diltiazem (CARDIZEM CD) 180 MG 24 hr capsule TAKE 1 CAPSULE(180 MG) BY MOUTH DAILY 08/30/22   Jake Bathe, MD  famotidine (PEPCID) 20 MG tablet TAKE 1 TABLET(20 MG) BY MOUTH DAILY 06/02/23   Marcelyn Bruins, MD  flecainide (TAMBOCOR) 50 MG tablet TAKE 1 TABLET(50 MG) BY MOUTH TWICE DAILY 12/10/22    Camnitz, Andree Coss, MD  furosemide (LASIX) 20 MG tablet Take 1 tablet (20 mg total) by mouth every morning. 09/29/20   Almon Hercules, MD  HYDROcodone-acetaminophen (NORCO/VICODIN) 5-325 MG tablet Take 1 tablet by mouth every 6 (six) hours as needed for severe pain. 10/17/22   Pollyann Savoy, MD  JARDIANCE 25 MG TABS tablet Take 1 tablet by mouth daily.    [provider]  methylphenidate (RITALIN) 10 MG tablet Take 10 mg by mouth as needed (narcoleptic episodes when driving long distances.).    [provider]  metoprolol succinate (TOPROL-XL) 100 MG 24 hr tablet Take 1 tablet (100 mg total) by mouth daily. Please call [phone] to schedule a March appointment for future refills. Thank you. 04/27/23   Jake Bathe, MD  omeprazole (PRILOSEC OTC) 20 MG tablet Take 20 mg by mouth at bedtime.    [provider]  Polyethylene Glycol 400 (BLINK TEARS) 0.25 % SOLN Place 1 drop into both eyes daily as needed (dry eyes).    [provider]  potassium chloride (KLOR-CON M) 10 MEQ tablet Take 10 mEq by mouth daily. 05/03/23   [provider]  rivaroxaban (XARELTO) 20 MG TABS tablet Take 1 tablet (20 mg total) by mouth daily with supper. 02/23/21   Jake Bathe, MD  rOPINIRole (REQUIP) 2 MG tablet Take 1-2 mg by mouth See admin instructions.  Take 1/2 tablet (1 mg) by mouth with supper as needed for restless legs, take 1 tablet (2 mg) daily at bedtime 07/29/20   [provider]  Semaglutide,0.25 or 0.5MG /DOS, (OZEMPIC, 0.25 OR 0.5 MG/DOSE,) 2 MG/3ML SOPN Inject 1 mg into the skin once a week.    [provider]  sulfamethoxazole-trimethoprim (BACTRIM DS) 800-160 MG tablet Take 1 tablet by mouth 2 (two) times daily. 06/29/23   [provider]  tiZANidine (ZANAFLEX) 2 MG tablet Take 2 mg by mouth 3 (three) times daily as needed. 12/01/22   [provider]  Trospium Chloride 60 MG CP24 TAKE 1 CAPSULE(60 MG) BY MOUTH DAILY 06/01/23   Wyatt Haste T, MD  Vilazodone HCl (VIIBRYD) 40 MG TABS Take 40 mg by mouth daily. 05/25/23   [provider]      Allergies    Dilaudid [hydromorphone], Sulfa antibiotics, Dilaudid [hydromorphone hcl], Irbesartan, Keflex [cephalexin], Metformin hcl, Sulindac, and Tramadol    Review of Systems   Review of Systems  Constitutional:  Negative for fever.  HENT:  Positive for congestion.   Respiratory:  Positive for cough, shortness of breath and wheezing.   Cardiovascular:  Negative for chest pain and leg swelling.    Physical Exam Updated Vital Signs BP 106/65   Pulse 71   Temp 98.9 F (37.2 C) (Oral)   Resp (!) 26   Ht 5\' 3"  (1.6 m)   Wt 113.4 kg   SpO2 93%   BMI 44.29 kg/m  Physical Exam Vitals and nursing note reviewed.  Constitutional:      General: She is not in acute distress.    Appearance: She is well-developed. She is obese. She is not ill-appearing or diaphoretic.  HENT:     Head: Normocephalic and atraumatic.  Cardiovascular:     Rate and Rhythm: Normal rate and regular rhythm.     Heart sounds: Normal heart sounds.  Pulmonary:     Effort: Pulmonary effort is normal. No accessory muscle usage or respiratory distress.     Breath sounds: Wheezing (diffuse) present.  Abdominal:     Palpations: Abdomen is soft.     Tenderness: There is no abdominal tenderness.  Musculoskeletal:     Right lower leg: No edema.     Left lower leg: No edema.  Skin:    General: Skin is warm and dry.  Neurological:     Mental Status: She is alert.     ED Results / Procedures / Treatments   Labs (all labs ordered are listed, but only abnormal results are displayed) Labs Reviewed  COMPREHENSIVE METABOLIC PANEL WITH GFR - Abnormal; Notable for the following components:      Result Value   Glucose, Bld 291 (*)    Creatinine, Ser 1.05 (*)    Albumin 3.4 (*)    GFR, Estimated 59 (*)    All other components within normal limits  CBC WITH DIFFERENTIAL/PLATELET - Abnormal; Notable  for the following components:   RBC 5.56 (*)    Hemoglobin 15.6 (*)    HCT 47.8 (*)    RDW 16.5 (*)    All other components within normal limits  RESP PANEL BY RT-PCR (RSV, FLU A&B, COVID)  RVPGX2  CULTURE, BLOOD (ROUTINE X 2)  CULTURE, BLOOD (ROUTINE X 2)  BRAIN NATRIURETIC PEPTIDE  TROPONIN I (HIGH SENSITIVITY)  TROPONIN I (HIGH SENSITIVITY)    EKG EKG Interpretation Date/Time:  Saturday July 02 2023 11:16:29 EDT Ventricular Rate:  69 PR Interval:  172 QRS Duration:  159 QT Interval:  462 QTC Calculation: 495 R Axis:   -40  Text Interpretation: Sinus rhythm Left bundle branch block no significant change since June 30 2023 Confirmed by Pricilla Loveless [phone]) on 07/02/2023 11:52:33 AM  Radiology CT Angio Chest PE W and/or Wo Contrast Result Date: 07/02/2023 CLINICAL DATA:  Shortness of breath.  Concern for pulmonary. EXAM: CT ANGIOGRAPHY CHEST WITH CONTRAST TECHNIQUE: Multidetector CT imaging of the chest was performed using the standard protocol during bolus administration of intravenous contrast. Multiplanar CT image reconstructions and MIPs were obtained to evaluate the vascular anatomy. RADIATION DOSE REDUCTION: This exam was performed according to the departmental dose-optimization program which includes automated exposure control, adjustment of the mA and/or kV according to patient size and/or use of iterative reconstruction technique. CONTRAST:  OMNIPAQUE IOHEXOL 350 MG/ML SOLN COMPARISON:  Chest radiograph dated 07/02/2023. FINDINGS: Cardiovascular: There is no cardiomegaly or pericardial effusion. Mild atherosclerotic calcification of the thoracic aorta. No aneurysmal dilatation. No pulmonary artery embolus identified. Mediastinum/Nodes: No hilar or mediastinal adenopathy. The esophagus is grossly unremarkable. No mediastinal fluid collection. Lungs/Pleura: Diffuse ground-glass airspace densities throughout the right lung with upper lobe predominant consistent with atypical  pneumonia. Clinical correlation is recommended. Trace right pleural effusion. No pneumothorax. The central airways are patent. Upper Abdomen: No acute abnormality. Musculoskeletal: Degenerative changes of the spine. No acute osseous pathology. Review of the MIP images confirms the above findings. IMPRESSION: 1. No CT evidence of pulmonary artery embolus. 2. Diffuse ground-glass airspace densities throughout the right lung consistent with atypical pneumonia. Electronically Signed   By: Elgie Collard M.D.   On: 07/02/2023 13:18   DG Chest 2 View Result Date: 07/02/2023 CLINICAL DATA:  Shortness of breath. EXAM: CHEST - 2 VIEW COMPARISON:  06/29/2023 FINDINGS: The lungs are clear without focal pneumonia, edema, pneumothorax or pleural effusion. The cardiopericardial silhouette is within normal limits for size. No acute bony abnormality. Telemetry leads overlie the chest. IMPRESSION: No active cardiopulmonary disease. Electronically Signed   By: Kennith Center M.D.   On: 07/02/2023 12:05    Procedures Procedures    Medications Ordered in ED Medications  ipratropium-albuterol (DUONEB) 0.5-2.5 (3) MG/3ML nebulizer solution 3 mL (has no administration in time range)    ED Course/ Medical Decision Making/ A&P                                 Medical Decision Making Amount and/or Complexity of Data Reviewed Labs: ordered.    Details: Normal WBC. Radiology: ordered and independent interpretation performed.    Details: No PE but does have right sided abnormal lung findings ECG/medicine tests: ordered and independent interpretation performed.    Details: Sign rhythm  Risk Prescription drug management. Decision regarding hospitalization.   Patient is found to have respiratory failure with hypoxia.  She had some abnormal lung sounds and was given a breathing treatment and feels like she is breathing better but is still requiring 2 L of oxygen.  CTA does not show a PE but is showing asymmetric  findings consistent with atypical pneumonia.  Was given IV antibiotics and blood cultures were obtained.  She does not appear septic.  I discussed with Dr. Benjamine Mola, who will admit.        Final Clinical Impression(s) / ED Diagnoses Final diagnoses:  Acute respiratory failure with hypoxia (HCC)    Rx / DC Orders ED Discharge Orders  None         Pricilla Loveless, MD 07/02/23 1450

## 2023-07-02 NOTE — Assessment & Plan Note (Deleted)
 Poorly controlled. A1C 9.6 in January 2025 Continue jardiance, continue ozempic outpatient (has been out x 2 weeks)  SSI and accuchecks QAC/HS Start Long acting insulin 5 units/daily for uncontrolled blood sugars

## 2023-07-02 NOTE — Assessment & Plan Note (Signed)
 03/2023 EF of 40-45%, most recent echo in 06/2023 with EF of 55-60% and grade 1DD She is euvolemic, BNP normal and no vascular congestion/edema on CXR Continue metoprolol, jardiance and lasix daily  Daily weights

## 2023-07-02 NOTE — Assessment & Plan Note (Signed)
Continue lipitor 20mg daily  

## 2023-07-02 NOTE — Assessment & Plan Note (Signed)
Cpap at night  

## 2023-07-02 NOTE — Assessment & Plan Note (Signed)
 Continue vilazodone and wellbutrin

## 2023-07-02 NOTE — Assessment & Plan Note (Signed)
 Just admitted on 3/2-4/3 for atrial fib with RVR and started on cardizem gtt.  Spontaneously converted and discharged home.  Has been out of her metoprolol  Continue tele  She is rate controlled today  TSH normal in 1/25 and magnesium normal 2 days ago  Continue Cardizem, metoprolol, flecainide and xarelto

## 2023-07-02 NOTE — ED Notes (Signed)
 Returned from [employer], on room air SpO2 78% with good pleth, patient work of breathing normal.  BBS unchanged, placed back on 2lpm [place].

## 2023-07-02 NOTE — ED Notes (Signed)
 [facility] at bedside

## 2023-07-02 NOTE — ED Notes (Signed)
 Patient seen before triage, SpO2 85% on room air.  No increased work of breathing noted.  Patient reports at home SpO2 70-80%. BBS decreased with scattered crackles.

## 2023-07-02 NOTE — Progress Notes (Signed)
   07/02/23 2342  BiPAP/CPAP/SIPAP  $ Non-Invasive Home Ventilator  Initial  BiPAP/CPAP/SIPAP Pt Type Adult  BiPAP/CPAP/SIPAP Resmed  Mask Type Nasal mask  Dentures removed? Not applicable  Mask Size Small  Flow Rate 2 lpm  Patient Home Machine No  Patient Home Mask No  Patient Home Tubing No  Auto Titrate Yes  Minimum cmH2O 5 cmH2O  Maximum cmH2O 20 cmH2O  Device Plugged into RED Power Outlet Yes  BiPAP/CPAP /SiPAP Vitals  Resp (!) 25  MEWS Score/Color  MEWS Score 1  MEWS Score Color [place] Si

## 2023-07-02 NOTE — Plan of Care (Signed)

## 2023-07-02 NOTE — Assessment & Plan Note (Signed)
 No cellulitic findings, no pain with probing  Start bactroban TID

## 2023-07-02 NOTE — Assessment & Plan Note (Signed)
Continue pepcid and PPI

## 2023-07-02 NOTE — ED Triage Notes (Signed)
 Pt reports some SOB this morning and noted that she was having some SOB. Denies chest pain Oxygen at home was 70% . In triage she was noted to have O2 in the 80's. Respiratory at bedside for triage. Placed on 4L by RT.

## 2023-07-02 NOTE — ED Notes (Signed)
Report given to Hazel RN with [facility]  

## 2023-07-03 DIAGNOSIS — J9601 Acute respiratory failure with hypoxia: Secondary | ICD-10-CM | POA: Diagnosis not present

## 2023-07-03 DIAGNOSIS — I480 Paroxysmal atrial fibrillation: Secondary | ICD-10-CM | POA: Diagnosis not present

## 2023-07-03 DIAGNOSIS — R198 Other specified symptoms and signs involving the digestive system and abdomen: Secondary | ICD-10-CM | POA: Diagnosis not present

## 2023-07-03 DIAGNOSIS — J189 Pneumonia, unspecified organism: Secondary | ICD-10-CM | POA: Diagnosis not present

## 2023-07-03 DIAGNOSIS — I10 Essential (primary) hypertension: Secondary | ICD-10-CM

## 2023-07-03 DIAGNOSIS — J14 Pneumonia due to Hemophilus influenzae: Secondary | ICD-10-CM | POA: Diagnosis present

## 2023-07-03 LAB — CBC
HCT: 48.5 % — ABNORMAL HIGH (ref 36.0–46.0)
Hemoglobin: 15.3 g/dL — ABNORMAL HIGH (ref 12.0–15.0)
MCH: 28 pg (ref 26.0–34.0)
MCHC: 31.5 g/dL (ref 30.0–36.0)
MCV: 88.7 fL (ref 80.0–100.0)
Platelets: 258 10*3/uL (ref 150–400)
RBC: 5.47 MIL/uL — ABNORMAL HIGH (ref 3.87–5.11)
RDW: 17.2 % — ABNORMAL HIGH (ref 11.5–15.5)
WBC: 7.6 10*3/uL (ref 4.0–10.5)
nRBC: 0 % (ref 0.0–0.2)

## 2023-07-03 LAB — GLUCOSE, CAPILLARY
Glucose-Capillary: 195 mg/dL — ABNORMAL HIGH (ref 70–99)
Glucose-Capillary: 277 mg/dL — ABNORMAL HIGH (ref 70–99)
Glucose-Capillary: 211 mg/dL — ABNORMAL HIGH (ref 70–99)

## 2023-07-03 LAB — BASIC METABOLIC PANEL WITH GFR
Anion gap: 11 (ref 5–15)
BUN: 18 mg/dL (ref 8–23)
CO2: 23 mmol/L (ref 22–32)
Calcium: 9 mg/dL (ref 8.9–10.3)
Chloride: 101 mmol/L (ref 98–111)
Creatinine, Ser: 0.67 mg/dL (ref 0.44–1.00)
GFR, Estimated: >60 mL/min (ref >60)
Glucose, Bld: 188 mg/dL — ABNORMAL HIGH (ref 70–99)
Potassium: 3.9 mmol/L (ref 3.5–5.1)
Sodium: 135 mmol/L (ref 135–145)

## 2023-07-03 LAB — PROCALCITONIN: Procalcitonin: <0.1 ng/mL

## 2023-07-03 MED ORDER — METOPROLOL TARTRATE 25 MG PO TABS: 12.5000 mg | ORAL_TABLET | Freq: Two times a day (BID) | ORAL | 2 refills | Status: DC

## 2023-07-03 MED ORDER — BUTALBITAL-APAP-CAFFEINE 50-325-40 MG PO TABS
2.0000 | ORAL_TABLET | Freq: Once | ORAL | Status: AC
  Administered 2023-07-03: 2 via ORAL
  Filled 2023-07-03: qty 2

## 2023-07-03 MED ORDER — CEFDINIR 300 MG PO CAPS
300.0000 mg | ORAL_CAPSULE | Freq: Two times a day (BID) | ORAL | 0 refills | Status: AC
Start: 2023-07-03 — End: 2023-07-07

## 2023-07-03 MED ORDER — ALBUTEROL SULFATE HFA 108 (90 BASE) MCG/ACT IN AERS: 2.0000 | INHALATION_SPRAY | RESPIRATORY_TRACT | 0 refills | Status: DC | PRN

## 2023-07-03 MED ORDER — ACETAMINOPHEN-CAFFEINE 500-65 MG PO TABS
2.0000 | ORAL_TABLET | Freq: Once | ORAL | Status: DC
  Filled 2023-07-03: qty 2

## 2023-07-03 MED ORDER — AZITHROMYCIN 250 MG PO TABS: 250.0000 mg | ORAL_TABLET | Freq: Every day | ORAL | 0 refills | Status: AC

## 2023-07-03 NOTE — TOC Initial Note (Signed)
 Transition of Care [facility]) - Initial/Assessment Note    Patient Details  Name: Kristin [name] MRN: [id number] Date of Birth: May 18, 1955  [facility]) CM/SW Contact:    Adrian Prows, RN Phone Number: 07/03/2023, 5:15 PM  Clinical Narrative:                 Sherron Monday w/ pt in room; pt says she lives at home; she plans to return at d/c; pt identified POC niece Horton Chin [phone]); pt will arrange transportation; she verified insurance/PCP; she denied SDOH risks; pt says she has a cane, and cpap; she does not have HH services or home oxygen; pt says she attends OPPT; no TOC needs; TOC signing off; please place consult if needed.  Expected Discharge Plan: Home/Self Care Barriers to Discharge: No Barriers Identified   Patient Goals and CMS Choice Patient states their goals for this hospitalization and ongoing recovery are:: home CMS Medicare.gov Compare Post Acute Care list provided to:: Patient   [place] ownership interest in [facility].provided to:: Patient    Expected Discharge Plan and Services   Discharge Planning Services: CM Consult   Living arrangements for the past 2 months: Single Family Home                                      Prior Living Arrangements/Services Living arrangements for the past 2 months: Single Family Home Lives with:: Self Patient language and need for interpreter reviewed:: Yes Do you feel safe going back to the place where you live?: Yes      Need for Family Participation in Patient Care: Yes (Comment) Care giver support system in place?: Yes (comment) Current home services: DME (cane) Criminal Activity/Legal Involvement Pertinent to Current Situation/Hospitalization: No - Comment as needed  Activities of Daily Living   ADL Screening (condition at time of admission) Independently performs ADLs?: Yes (appropriate for developmental age) Is the patient deaf or have difficulty hearing?: No Does the patient  have difficulty seeing, even when wearing glasses/contacts?: No Does the patient have difficulty concentrating, remembering, or making decisions?: No  Permission Sought/Granted Permission sought to share information with : Case Manager Permission granted to share information with : Yes, Verbal Permission Granted  Share Information with NAME: Case Manager     Permission granted to share info w Relationship: Horton Chin (niece) [phone]     Emotional Assessment Appearance:: Appears stated age Attitude/Demeanor/Rapport: Gracious Affect (typically observed): Accepting Orientation: : Oriented to Self, Oriented to Place, Oriented to  Time, Oriented to Situation Alcohol / Substance Use: Not Applicable Psych Involvement: No (comment)  Admission diagnosis:  Hypoxia [R09.02] Acute respiratory failure with hypoxia (HCC) [J96.01] Pneumonia of right lower lobe due to Haemophilus influenzae (HCC) [J14] Patient Active Problem List   Diagnosis Date Noted   Pneumonia of right lower lobe due to Haemophilus influenzae (HCC) 07/03/2023   Hypoxia 07/02/2023   Umbilical bleeding 07/02/2023   combined CHF with recovered EF 07/02/2023   Atrial fibrillation with RVR (HCC) 06/30/2023   Skin infection 06/30/2023   Electrolyte disturbance 04/25/2023   Transient hypotension 04/25/2023   Prolonged QT interval 04/25/2023   Erythrocytosis 04/25/2023   Hypokalemia 04/25/2023   Constipation 01/31/2023   Nocturia 01/31/2023   Acquired thrombophilia (HCC) 11/09/2021   Anxiety and depression 11/09/2021   Attention deficit hyperactivity disorder, predominantly inattentive type 11/09/2021   Binge eating disorder 11/09/2021  Body mass index (BMI) 45.0-49.9, adult (HCC) 11/09/2021   Cyst of ovary 11/09/2021   Essential hypertension 11/09/2021   Gastroesophageal reflux disease 11/09/2021   Hardening of the aorta (main artery of the heart) (HCC) 11/09/2021   Hypomagnesemia 11/09/2021   Migraine 11/09/2021    Recurrent major depression in remission (HCC) 11/09/2021   Restless legs 11/09/2021   Rosacea 11/09/2021   Mixed stress and urge urinary incontinence 11/09/2021   Chronic anticoagulation 09/21/2021   Lower extremity edema 09/21/2021   Fracture of distal phalanx of finger 02/17/2021   GIB (gastrointestinal bleeding) 09/21/2020   GI bleed 09/19/2020   Atrial fibrillation with rapid ventricular response (HCC) 03/16/2018   Rotator cuff tear arthropathy of left shoulder 07/19/2017   Impingement syndrome of right shoulder 04/08/2016   Ganglion, left wrist 04/08/2016   Chronic midline low back pain without sciatica 04/08/2016   Microcytic anemia    Diabetes mellitus with coincident hypertension (HCC) 11/04/2014   Right nephrolithiasis 08/27/2013   Left nephrolithiasis 08/27/2013   Bradycardia 08/27/2013   Paroxysmal atrial fibrillation (HCC) 08/27/2013   Chest pain 12/15/2012   LBBB (left bundle branch block) - rate related 12/03/2012   Type 2 diabetes mellitus (HCC) 12/03/2012   Morbid obesity, unspecified obesity type (HCC) 12/03/2012   Hypercalcemia 12/03/2012   Elevated LFTs 12/03/2012   OSA on CPAP 12/03/2012   Hyperlipidemia 12/03/2012   Normocytic anemia 12/03/2012   Cough 01/17/2012   PCP:  [name], MD Pharmacy:   [id number] [name], [place] - [address] AT [facility] & [address] [place] [address] Phone: [phone] Fax: [phone]  [facility] [address] [place] [address] Phone: [phone] Fax: [phone]     Social Drivers of Health (SDOH) Social History: SDOH Screenings   Food Insecurity: No Food Insecurity (07/03/2023)  Housing: Low Risk  (07/03/2023)  Transportation Needs: No Transportation Needs (07/03/2023)  Utilities: Not At Risk (07/03/2023)  Social Connections: Socially Isolated (07/02/2023)  Tobacco Use: Low Risk  (07/02/2023)   SDOH  Interventions: Food Insecurity Interventions: Intervention Not Indicated, Inpatient TOC Housing Interventions: Intervention Not Indicated, Inpatient TOC Transportation Interventions: Intervention Not Indicated, Inpatient TOC Utilities Interventions: Intervention Not Indicated, Inpatient TOC   Readmission Risk Interventions    07/03/2023    5:13 PM  Readmission Risk Prevention Plan  Transportation Screening Complete  PCP or Specialist Appt within 5-7 Days Complete  Home Care Screening Complete  Medication Review (RN CM) Complete

## 2023-07-03 NOTE — Hospital Course (Addendum)
 68 y.o. female with past medical medical history significant of hypertension, hyperlipidemia, paroxysmal atrial fibrillation on chronic anticoagulation, diabetes mellitus type 2, anxiety, depression, GERD, OSA, and morbid obesity who presented to [facility] complains of shortness of breath and notable hypoxia at home of 74% on home pulse oximetry.    Of note, patient just underwent a brief hospital stay on 4/3 for rapid atrial fibrillation at [facility].  Upon evaluation in the emergency department on this presentation patient was found to be hypoxic and was placed on oxygen via nasal cannula.  CT angiogram of the chest was performed which revealed diffuse groundglass airspace densities throughout the right lung consistent with atypical pneumonia.  Patient was initiated on Rocephin and azithromycin and the hospitalist group was then called to assess the patient for admission to the hospital.    While hospitalized patient's symptoms rapidly improved on intravenous antibiotic therapy.  Patient was quickly weaned off of supplemental oxygen and patient was able to ambulate without any evidence of oxygen requirement.    With patient's improving symptoms, patient was transition to oral Omnicef and azithromycin to complete a 5-day course of antibiotics for right-sided pneumonia.  Patient was discharged home in improved and stable condition on 07/03/2023.

## 2023-07-03 NOTE — Care Management Obs Status (Signed)
 MEDICARE OBSERVATION STATUS NOTIFICATION   Patient Details  Name: Kristin [name] MRN: [id number] Date of Birth: June 27, 1955   Medicare Observation Status Notification Given:  Yes    Adrian Prows, RN 07/03/2023, 5:07 PM

## 2023-07-03 NOTE — Care Management CC44 (Signed)
 Condition Code 44 Documentation Completed  Patient Details  Name: Kristin [name] MRN: [id number] Date of Birth: October 24, 1955   Condition Code 44 given:  Yes Patient signature on Condition Code 44 notice:  Yes Documentation of 2 MD's agreement:  Yes Code 44 added to claim:  Yes    Adrian Prows, RN 07/03/2023, 5:07 PM

## 2023-07-03 NOTE — Discharge Instructions (Signed)
 Please take all prescribed medications exactly as instructed including your entire course of antibiotics and your rescue inhaler to be used every 4 hours for shortness of breath and wheezing as needed.  Additionally your Metoprolol regimen has been switched to a shorter action version of this medication.  Please refer to your medication list for details.  Please consume a low-sodium low carbohydrate diet diet Please increase your physical activity as tolerated. Please maintain all outpatient follow-up appointments including follow-up with your primary care provider.  Please return to the emergency department if you develop worsening shortness of breath, fevers in excess of 100.4 F, weakness or inability to tolerate oral intake.

## 2023-07-03 NOTE — Discharge Summary (Signed)
 Physician Discharge Summary   Patient: Kristin [name] MRN: [id number] DOB: [birth year]  Admit date:     07/02/2023  Discharge date: 07/03/23  Discharge Physician: Marinda Elk   PCP: [name], MD   Recommendations at discharge:   Please take all prescribed medications exactly as instructed including your entire course of antibiotics and your rescue inhaler to be used every 4 hours for shortness of breath and wheezing as needed.  Additionally your Metoprolol regimen has been switched to a shorter action version of this medication.  Please refer to your medication list for details.  Please consume a low-sodium low carbohydrate diet diet Please increase your physical activity as tolerated. Please maintain all outpatient follow-up appointments including follow-up with your primary care provider.  Please return to the emergency department if you develop worsening shortness of breath, fevers in excess of 100.4 F, weakness or inability to tolerate oral intake.  Discharge Diagnoses: Principal Problem:   Acute respiratory failure with hypoxia (HCC) Active Problems:   Umbilical bleeding   Pneumonia of right lung due to infectious organism   Paroxysmal atrial fibrillation (HCC)   Type 2 diabetes mellitus (HCC)   Essential hypertension   Anxiety and depression   combined CHF with recovered EF   Erythrocytosis   Hyperlipidemia   Gastroesophageal reflux disease   OSA on CPAP   Pneumonia of right lower lobe due to Haemophilus influenzae (HCC)  Resolved Problems:   * No resolved hospital problems. *   Hospital Course: 68 y.o. female with past medical medical history significant of hypertension, hyperlipidemia, paroxysmal atrial fibrillation on chronic anticoagulation, diabetes mellitus type 2, anxiety, depression, GERD, OSA, and morbid obesity who presented to [facility] complains of shortness of breath and notable hypoxia at home of 74% on home pulse oximetry.    Of note,  patient just underwent a brief hospital stay on 4/3 for rapid atrial fibrillation at [facility].  Upon evaluation in the emergency department on this presentation patient was found to be hypoxic and was placed on oxygen via nasal cannula.  CT angiogram of the chest was performed which revealed diffuse groundglass airspace densities throughout the right lung consistent with atypical pneumonia.  Patient was initiated on Rocephin and azithromycin and the hospitalist group was then called to assess the patient for admission to the hospital.    While hospitalized patient's symptoms rapidly improved on intravenous antibiotic therapy.  Patient was quickly weaned off of supplemental oxygen and patient was able to ambulate without any evidence of oxygen requirement.    With patient's improving symptoms, patient was transition to oral Omnicef and azithromycin to complete a 5-day course of antibiotics for right-sided pneumonia.  Patient was discharged home in improved and stable condition on 07/03/2023.   Consultants: none Procedures performed: none  Disposition: Home Diet recommendation:  Discharge Diet Orders (From admission, onward)     Start     Ordered   07/03/23 0000  Diet - low sodium heart healthy        07/03/23 1845           Cardiac and Carb modified diet  DISCHARGE MEDICATION: Allergies as of 07/03/2023       Reactions   Dilaudid [hydromorphone]    Other reaction(s): Respiratory Distress, Unknown   Sulfa Antibiotics Other (See Comments), Itching   Yeast infection/itching   Dilaudid [hydromorphone Hcl] Other (See Comments)   Changed respirations   Irbesartan Swelling   Metformin Hcl Diarrhea   Sulindac Other (  See Comments)   Yeast infection/itching Other reaction(s): Unknown   Tramadol Itching   itching        Medication List     STOP taking these medications    diltiazem 30 MG tablet Commonly known as: CARDIZEM   metoprolol succinate 100 MG 24 hr  tablet Commonly known as: TOPROL-XL   sulfamethoxazole-trimethoprim 800-160 MG tablet Commonly known as: BACTRIM DS       TAKE these medications    acetaminophen-caffeine 500-65 MG Tabs per tablet Commonly known as: EXCEDRIN TENSION HEADACHE Take 1 tablet by mouth as needed (headache, pain).   albuterol 108 (90 Base) MCG/ACT inhaler Commonly known as: VENTOLIN HFA Inhale 2 puffs into the lungs every 4 (four) hours as needed for wheezing or shortness of breath.   atorvastatin 20 MG tablet Commonly known as: LIPITOR Take 20 mg by mouth at bedtime.   azithromycin 250 MG tablet Commonly known as: ZITHROMAX Take 1 tablet (250 mg total) by mouth daily for 4 days.   AZO CRANBERRY PO Take 1 tablet by mouth as needed.   Blink Tears 0.25 % Soln Generic drug: Polyethylene Glycol 400 Place 1 drop into both eyes daily as needed (dry eyes).   buPROPion 300 MG 24 hr tablet Commonly known as: WELLBUTRIN XL Take 300 mg by mouth every morning.   cefdinir 300 MG capsule Commonly known as: OMNICEF Take 1 capsule (300 mg total) by mouth 2 (two) times daily for 4 days.   cetirizine 10 MG tablet Commonly known as: ZYRTEC Take 1 tablet (10 mg total) by mouth daily.   diclofenac Sodium 1 % Gel Commonly known as: VOLTAREN Apply 2-4 g topically daily as needed (Lt leg pain.).   diltiazem 180 MG 24 hr capsule Commonly known as: CARDIZEM CD TAKE 1 CAPSULE(180 MG) BY MOUTH DAILY What changed: See the new instructions.   famotidine 20 MG tablet Commonly known as: PEPCID TAKE 1 TABLET(20 MG) BY MOUTH DAILY What changed: See the new instructions.   flecainide 50 MG tablet Commonly known as: TAMBOCOR TAKE 1 TABLET(50 MG) BY MOUTH TWICE DAILY What changed: See the new instructions.   furosemide 20 MG tablet Commonly known as: LASIX Take 1 tablet (20 mg total) by mouth every morning. What changed: how much to take   Jardiance 25 MG Tabs tablet Generic drug: empagliflozin Take 1  tablet by mouth daily.   magnesium oxide 400 (240 Mg) MG tablet Commonly known as: MAG-OX Take 400 mg by mouth daily.   methylphenidate 10 MG tablet Commonly known as: RITALIN Take 10 mg by mouth as needed (narcoleptic episodes when driving long distances.).   metoprolol tartrate 25 MG tablet Commonly known as: LOPRESSOR Take 0.5 tablets (12.5 mg total) by mouth 2 (two) times daily.   metroNIDAZOLE 0.75 % cream Commonly known as: METROCREAM Apply 1 Application topically 2 (two) times daily as needed.   omeprazole 20 MG tablet Commonly known as: PRILOSEC OTC Take 20 mg by mouth at bedtime.   Ozempic (0.25 or 0.5 MG/DOSE) 2 MG/3ML Sopn Generic drug: Semaglutide(0.25 or 0.5MG /DOS) Inject 1 mg into the skin once a week.   potassium chloride 10 MEQ tablet Commonly known as: KLOR-CON M Take 10 mEq by mouth daily.   rivaroxaban 20 MG Tabs tablet Commonly known as: Xarelto Take 1 tablet (20 mg total) by mouth daily with supper.   rOPINIRole 2 MG tablet Commonly known as: REQUIP Take 1-2 mg by mouth See admin instructions. Take 1/2 tablet (1 mg) by mouth  with supper as needed for restless legs, take 1 tablet (2 mg) daily at bedtime   tiZANidine 2 MG tablet Commonly known as: ZANAFLEX Take 2 mg by mouth 3 (three) times daily as needed.   Trospium Chloride 60 MG Cp24 TAKE 1 CAPSULE(60 MG) BY MOUTH DAILY What changed: See the new instructions.   Vilazodone HCl 40 MG Tabs Commonly known as: VIIBRYD Take 40 mg by mouth daily.   Vitamin D-3 125 MCG (5000 UT) Tabs Take 5,000 Units by mouth at bedtime.        Follow-up Information     [name], MD Follow up in 1 week(s).   Specialty: Family Medicine Contact information: [phone] [place] [address] [phone]                 Discharge Exam: Ceasar Mons Weights   07/02/23 1114  Weight: 113.4 kg    Constitutional: Awake alert and oriented x3, no associated distress.   Respiratory:  Scattered rhonchi throughout the right lung fields.  Normal respiratory effort. No accessory muscle use.  Cardiovascular: Regular rate and rhythm, no murmurs / rubs / gallops. No extremity edema. 2+ pedal pulses. No carotid bruits.  Abdomen: Abdomen is soft and nontender.  No evidence of intra-abdominal masses.  Positive bowel sounds noted in all quadrants.   Musculoskeletal: No joint deformity upper and lower extremities. Good ROM, no contractures. Normal muscle tone.     Condition at discharge: fair  The results of significant diagnostics from this hospitalization (including imaging, microbiology, ancillary and laboratory) are listed below for reference.   Imaging Studies: CT Angio Chest PE W and/or Wo Contrast Result Date: 07/02/2023 CLINICAL DATA:  Shortness of breath.  Concern for pulmonary. EXAM: CT ANGIOGRAPHY CHEST WITH CONTRAST TECHNIQUE: Multidetector CT imaging of the chest was performed using the standard protocol during bolus administration of intravenous contrast. Multiplanar CT image reconstructions and MIPs were obtained to evaluate the vascular anatomy. RADIATION DOSE REDUCTION: This exam was performed according to the departmental dose-optimization program which includes automated exposure control, adjustment of the mA and/or kV according to patient size and/or use of iterative reconstruction technique. CONTRAST:  OMNIPAQUE IOHEXOL 350 MG/ML SOLN COMPARISON:  Chest radiograph dated 07/02/2023. FINDINGS: Cardiovascular: There is no cardiomegaly or pericardial effusion. Mild atherosclerotic calcification of the thoracic aorta. No aneurysmal dilatation. No pulmonary artery embolus identified. Mediastinum/Nodes: No hilar or mediastinal adenopathy. The esophagus is grossly unremarkable. No mediastinal fluid collection. Lungs/Pleura: Diffuse ground-glass airspace densities throughout the right lung with upper lobe predominant consistent with atypical pneumonia. Clinical correlation is  recommended. Trace right pleural effusion. No pneumothorax. The central airways are patent. Upper Abdomen: No acute abnormality. Musculoskeletal: Degenerative changes of the spine. No acute osseous pathology. Review of the MIP images confirms the above findings. IMPRESSION: 1. No CT evidence of pulmonary artery embolus. 2. Diffuse ground-glass airspace densities throughout the right lung consistent with atypical pneumonia. Electronically Signed   By: Elgie Collard M.D.   On: 07/02/2023 13:18   DG Chest 2 View Result Date: 07/02/2023 CLINICAL DATA:  Shortness of breath. EXAM: CHEST - 2 VIEW COMPARISON:  06/29/2023 FINDINGS: The lungs are clear without focal pneumonia, edema, pneumothorax or pleural effusion. The cardiopericardial silhouette is within normal limits for size. No acute bony abnormality. Telemetry leads overlie the chest. IMPRESSION: No active cardiopulmonary disease. Electronically Signed   By: Kennith Center M.D.   On: 07/02/2023 12:05   ECHOCARDIOGRAM LIMITED Result Date: 06/30/2023    ECHOCARDIOGRAM  LIMITED REPORT   Patient Name:   Kristin [name] Date of Exam: 06/30/2023 Medical Rec #:  [id number]       Height:       63.0 in Accession #:    [id number]      Weight:       259.0 lb Date of Birth:  June 25, 1955        BSA:          2.158 m Patient Age:    67 years        BP:           113/63 mmHg Patient Gender: F               HR:           71 bpm. Exam Location:  Inpatient Procedure: 2D Echo, Limited Echo, Cardiac Doppler and Color Doppler (Both            Spectral and Color Flow Doppler were utilized during procedure). Indications:    I50.9* Heart failure (unspecified)  History:        Patient has prior history of Echocardiogram examinations, most                 recent 04/25/2023. Arrythmias:Atrial Fibrillation and LBBB; Risk                 Factors:Hypertension and Diabetes.  Sonographer:    Maxwell Marion Referring Phys: [id number] MAHESH A CHANDRASEKHAR IMPRESSIONS  1. Limited echo for LVEF  2. Left  ventricular ejection fraction, by estimation, is 55 to 60%. Left ventricular ejection fraction by 2D MOD biplane is 59.5 %. The left ventricle has normal function. The left ventricle has no regional wall motion abnormalities. Left ventricular diastolic parameters are consistent with Grade I diastolic dysfunction (impaired relaxation).  3. Right ventricular systolic function is normal. The right ventricular size is normal.  4. The aortic valve is tricuspid. Aortic valve regurgitation is not visualized.  5. The inferior vena cava is normal in size with greater than 50% respiratory variability, suggesting right atrial pressure of 3 mmHg. Comparison(s): Changes from prior study are noted. 04/25/2023: LVEF 40-45%. FINDINGS  Left Ventricle: Left ventricular ejection fraction, by estimation, is 55 to 60%. Left ventricular ejection fraction by 2D MOD biplane is 59.5 %. The left ventricle has normal function. The left ventricle has no regional wall motion abnormalities. Definity contrast agent was given IV to delineate the left ventricular endocardial borders. Abnormal (paradoxical) septal motion, consistent with left bundle branch block. Left ventricular diastolic parameters are consistent with Grade I diastolic dysfunction (impaired relaxation). Indeterminate filling pressures. Right Ventricle: The right ventricular size is normal. No increase in right ventricular wall thickness. Right ventricular systolic function is normal. Aortic Valve: The aortic valve is tricuspid. Aortic valve regurgitation is not visualized. Pulmonic Valve: The pulmonic valve was normal in structure. Pulmonic valve regurgitation is not visualized. Venous: The inferior vena cava is normal in size with greater than 50% respiratory variability, suggesting right atrial pressure of 3 mmHg.  LV Volumes (MOD)               Biplane EF (MOD) LV vol d, MOD    143.0 ml      LV Biplane EF:   Left A2C:  ventricular LV vol  d, MOD    87.3 ml                        ejection A4C:                                            fraction by LV vol s, MOD    52.8 ml                        2D MOD A2C:                                            biplane is LV vol s, MOD    39.1 ml                        59.5 %. A4C: LV SV MOD A2C:   90.2 ml       Diastology LV SV MOD A4C:   87.3 ml       LV e' medial:    6.09 cm/s LV SV MOD BP:    68.9 ml       LV E/e' medial:  11.5                                LV e' lateral:   7.72 cm/s                                LV E/e' lateral: 9.1  RIGHT VENTRICLE RV S prime:     11.40 cm/s TAPSE (M-mode): 1.2 cm MITRAL VALVE MV Area (PHT): 4.01 cm MV Decel Time: 189 msec MV E velocity: 70.30 cm/s MV A velocity: 60.80 cm/s MV E/A ratio:  1.16 Zoila Shutter MD Electronically signed by Zoila Shutter MD Signature Date/Time: 06/30/2023/1:59:29 PM    Final    DG Chest Port 1 View Result Date: 06/29/2023 CLINICAL DATA:  Atrial fibrillation EXAM: PORTABLE CHEST 1 VIEW COMPARISON:  None Available. FINDINGS: The heart size and mediastinal contours are within normal limits. Both lungs are clear. The visualized skeletal structures are unremarkable. IMPRESSION: No active disease. Electronically Signed   By: Helyn Numbers M.D.   On: 06/29/2023 23:47    Microbiology: Results for orders placed or performed during the hospital encounter of 07/02/23  Resp panel by RT-PCR (RSV, Flu A&B, Covid) Anterior Nasal Swab     Status: None   Collection Time: 07/02/23 11:18 AM   Specimen: Anterior Nasal Swab  Result Value Ref Range Status   SARS Coronavirus 2 by RT PCR NEGATIVE NEGATIVE Final    Comment: (NOTE) SARS-CoV-2 target nucleic acids are NOT DETECTED.  The SARS-CoV-2 RNA is generally detectable in upper respiratory specimens during the acute phase of infection. The lowest concentration of SARS-CoV-2 viral copies this assay can detect is 138 copies/mL. A negative result does not preclude SARS-Cov-2 infection and should not  be used as the sole basis for treatment or other patient management decisions. A negative result may occur with  improper specimen collection/handling, submission of specimen other than nasopharyngeal swab, presence  of viral mutation(s) within the areas targeted by this assay, and inadequate number of viral copies(<138 copies/mL). A negative result must be combined with clinical observations, patient history, and epidemiological information. The expected result is Negative.  Fact Sheet for Patients:  BloggerCourse.com  Fact Sheet for Healthcare Providers:  SeriousBroker.it  This test is no t yet approved or cleared by the [place] FDA and  has been authorized for detection and/or diagnosis of SARS-CoV-2 by FDA under an Emergency Use Authorization (EUA). This EUA will remain  in effect (meaning this test can be used) for the duration of the COVID-19 declaration under Section 564(b)(1) of the Act, 21 U.S.C.section 360bbb-3(b)(1), unless the authorization is terminated  or revoked sooner.       Influenza A by PCR NEGATIVE NEGATIVE Final   Influenza B by PCR NEGATIVE NEGATIVE Final    Comment: (NOTE) The Xpert Xpress SARS-CoV-2/FLU/RSV plus assay is intended as an aid in the diagnosis of influenza from Nasopharyngeal swab specimens and should not be used as a sole basis for treatment. Nasal washings and aspirates are unacceptable for Xpert Xpress SARS-CoV-2/FLU/RSV testing.  Fact Sheet for Patients: BloggerCourse.com  Fact Sheet for Healthcare Providers: SeriousBroker.it  This test is not yet approved or cleared by the [place] FDA and has been authorized for detection and/or diagnosis of SARS-CoV-2 by FDA under an Emergency Use Authorization (EUA). This EUA will remain in effect (meaning this test can be used) for the duration of the COVID-19 declaration under Section  564(b)(1) of the Act, 21 U.S.C. section 360bbb-3(b)(1), unless the authorization is terminated or revoked.     Resp Syncytial Virus by PCR NEGATIVE NEGATIVE Final    Comment: (NOTE) Fact Sheet for Patients: BloggerCourse.com  Fact Sheet for Healthcare Providers: SeriousBroker.it  This test is not yet approved or cleared by the [place] FDA and has been authorized for detection and/or diagnosis of SARS-CoV-2 by FDA under an Emergency Use Authorization (EUA). This EUA will remain in effect (meaning this test can be used) for the duration of the COVID-19 declaration under Section 564(b)(1) of the Act, 21 U.S.C. section 360bbb-3(b)(1), unless the authorization is terminated or revoked.  Performed at [facility], [address] [place], [place] [address]   Culture, blood (routine x 2)     Status: None (Preliminary result)   Collection Time: 07/02/23  2:14 PM   Specimen: BLOOD  Result Value Ref Range Status   Specimen Description   Final    BLOOD RIGHT ANTECUBITAL Performed at [facility], [address] [place], [place] [address]    Special Requests   Final    BOTTLES DRAWN AEROBIC AND ANAEROBIC Blood Culture adequate volume Performed at [facility], [address] [place], [place] [address]    Culture   Final    NO GROWTH < 12 HOURS Performed at [facility], [address] [place], [place] [address]    Report Status PENDING  Incomplete  Culture, blood (routine x 2)     Status: None (Preliminary result)   Collection Time: 07/02/23  2:29 PM   Specimen: BLOOD  Result Value Ref Range Status   Specimen Description   Final    BLOOD LEFT ANTECUBITAL Performed at [facility], [address] [place], [place] [address]    Special Requests   Final    BOTTLES DRAWN AEROBIC AND ANAEROBIC Blood Culture adequate volume Performed at [facility]  [address], [address] [place], [place] [address]    Culture   Final    NO GROWTH < 12 HOURS Performed at [facility], [address] [place], [place] [address]    Report Status PENDING  Incomplete  Respiratory (~20 pathogens) panel by PCR     Status: None   Collection Time: 07/02/23  5:12 PM   Specimen: Nasopharyngeal Swab; Respiratory  Result Value Ref Range Status   Adenovirus NOT DETECTED NOT DETECTED Final   Coronavirus 229E NOT DETECTED NOT DETECTED Final    Comment: (NOTE) The Coronavirus on the Respiratory Panel, DOES NOT test for the novel  Coronavirus (2019 nCoV)    Coronavirus HKU1 NOT DETECTED NOT DETECTED Final   Coronavirus NL63 NOT DETECTED NOT DETECTED Final   Coronavirus OC43 NOT DETECTED NOT DETECTED Final   Metapneumovirus NOT DETECTED NOT DETECTED Final   Rhinovirus / Enterovirus NOT DETECTED NOT DETECTED Final   Influenza A NOT DETECTED NOT DETECTED Final   Influenza B NOT DETECTED NOT DETECTED Final   Parainfluenza Virus 1 NOT DETECTED NOT DETECTED Final   Parainfluenza Virus 2 NOT DETECTED NOT DETECTED Final   Parainfluenza Virus 3 NOT DETECTED NOT DETECTED Final   Parainfluenza Virus 4 NOT DETECTED NOT DETECTED Final   Respiratory Syncytial Virus NOT DETECTED NOT DETECTED Final   Bordetella pertussis NOT DETECTED NOT DETECTED Final   Bordetella Parapertussis NOT DETECTED NOT DETECTED Final   Chlamydophila pneumoniae NOT DETECTED NOT DETECTED Final   Mycoplasma pneumoniae NOT DETECTED NOT DETECTED Final    Comment: Performed at [facility], [address] [place], [place] [address]    Labs: CBC: Recent Labs  Lab 06/29/23 2331 06/30/23 0332 07/02/23 1126 07/03/23 0528  WBC 8.9 9.6 8.0 7.6  NEUTROABS  --  6.7 5.5  --   HGB 15.9* 15.3* 15.6* 15.3*  HCT 49.5* 46.6* 47.8* 48.5*  MCV 88.1 86.5 86.0 88.7  PLT 285 276 299 258   Basic Metabolic Panel: Recent Labs  Lab 06/29/23 2331 06/30/23 0332 07/02/23 1126 07/02/23 1818 07/03/23 0528  NA 136 137 135   --  135  K 3.7 4.2 4.0  --  3.9  CL 103 104 100  --  101  CO2 24 22 23   --  23  GLUCOSE 331* 350* 291*  --  188*  BUN 19 16 23   --  18  CREATININE 0.88 0.85 1.05*  --  0.67  CALCIUM 8.8* 8.8* 9.3  --  9.0  MG 1.8  --   --  1.9  --    Liver Function Tests: Recent Labs  Lab 06/30/23 0332 07/02/23 1126  AST 26 26  ALT 23 32  ALKPHOS 66 74  BILITOT 1.2 1.0  PROT 5.7* 6.6  ALBUMIN 3.1* 3.4*   CBG: Recent Labs  Lab 07/02/23 1648 07/02/23 2055 07/03/23 0734 07/03/23 1202 07/03/23 1708  GLUCAP 252* 278* 195* 277* 211*    Discharge time spent: greater than 30 minutes.  Signed: Marinda Elk, MD [facility] 07/03/2023

## 2023-07-03 NOTE — Plan of Care (Signed)
 Problem: Education: Goal: Knowledge of General Education information will improve Description: Including pain rating scale, medication(s)/side effects and non-pharmacologic comfort measures 07/03/2023 1849 by Juleen Starr, RN Outcome: Adequate for Discharge 07/03/2023 1849 by Juleen Starr, RN Outcome: Adequate for Discharge 07/03/2023 1339 by Juleen Starr, RN Outcome: Progressing   Problem: Health Behavior/Discharge Planning: Goal: Ability to manage health-related needs will improve 07/03/2023 1849 by Juleen Starr, RN Outcome: Adequate for Discharge 07/03/2023 1849 by Juleen Starr, RN Outcome: Adequate for Discharge 07/03/2023 1339 by Juleen Starr, RN Outcome: Progressing   Problem: Clinical Measurements: Goal: Ability to maintain clinical measurements within normal limits will improve 07/03/2023 1849 by Juleen Starr, RN Outcome: Adequate for Discharge 07/03/2023 1849 by Juleen Starr, RN Outcome: Adequate for Discharge 07/03/2023 1339 by Juleen Starr, RN Outcome: Progressing Goal: Will remain free from infection 07/03/2023 1849 by Juleen Starr, RN Outcome: Adequate for Discharge 07/03/2023 1849 by Juleen Starr, RN Outcome: Adequate for Discharge 07/03/2023 1339 by Juleen Starr, RN Outcome: Progressing Goal: Diagnostic test results will improve 07/03/2023 1849 by Juleen Starr, RN Outcome: Adequate for Discharge 07/03/2023 1849 by Juleen Starr, RN Outcome: Adequate for Discharge 07/03/2023 1339 by Juleen Starr, RN Outcome: Progressing Goal: Respiratory complications will improve 07/03/2023 1849 by Juleen Starr, RN Outcome: Adequate for Discharge 07/03/2023 1849 by Juleen Starr, RN Outcome: Adequate for Discharge 07/03/2023 1339 by Juleen Starr, RN Outcome: Progressing Goal: Cardiovascular complication will be avoided 07/03/2023 1849 by Juleen Starr, RN Outcome: Adequate for Discharge 07/03/2023 1849 by Juleen Starr, RN Outcome: Adequate for Discharge 07/03/2023 1339 by Juleen Starr, RN Outcome:  Progressing   Problem: Activity: Goal: Risk for activity intolerance will decrease 07/03/2023 1849 by Juleen Starr, RN Outcome: Adequate for Discharge 07/03/2023 1849 by Juleen Starr, RN Outcome: Adequate for Discharge 07/03/2023 1339 by Juleen Starr, RN Outcome: Progressing   Problem: Nutrition: Goal: Adequate nutrition will be maintained 07/03/2023 1849 by Juleen Starr, RN Outcome: Adequate for Discharge 07/03/2023 1849 by Juleen Starr, RN Outcome: Adequate for Discharge 07/03/2023 1339 by Juleen Starr, RN Outcome: Progressing   Problem: Coping: Goal: Level of anxiety will decrease 07/03/2023 1849 by Juleen Starr, RN Outcome: Adequate for Discharge 07/03/2023 1849 by Juleen Starr, RN Outcome: Adequate for Discharge 07/03/2023 1339 by Juleen Starr, RN Outcome: Progressing   Problem: Elimination: Goal: Will not experience complications related to bowel motility 07/03/2023 1849 by Juleen Starr, RN Outcome: Adequate for Discharge 07/03/2023 1849 by Juleen Starr, RN Outcome: Adequate for Discharge 07/03/2023 1339 by Juleen Starr, RN Outcome: Progressing Goal: Will not experience complications related to urinary retention 07/03/2023 1849 by Juleen Starr, RN Outcome: Adequate for Discharge 07/03/2023 1849 by Juleen Starr, RN Outcome: Adequate for Discharge 07/03/2023 1339 by Juleen Starr, RN Outcome: Progressing   Problem: Pain Managment: Goal: General experience of comfort will improve and/or be controlled 07/03/2023 1849 by Juleen Starr, RN Outcome: Adequate for Discharge 07/03/2023 1849 by Juleen Starr, RN Outcome: Adequate for Discharge 07/03/2023 1339 by Juleen Starr, RN Outcome: Progressing   Problem: Safety: Goal: Ability to remain free from injury will improve 07/03/2023 1849 by Juleen Starr, RN Outcome: Adequate for Discharge 07/03/2023 1849 by Juleen Starr, RN Outcome: Adequate for Discharge 07/03/2023 1339 by Juleen Starr, RN Outcome: Progressing   Problem: Skin Integrity: Goal: Risk for  impaired skin integrity will decrease 07/03/2023 1849 by Juleen Starr, RN Outcome: Adequate for Discharge 07/03/2023 1849 by Juleen Starr, RN Outcome: Adequate for Discharge 07/03/2023 1339 by Juleen Starr, RN Outcome: Progressing   Problem: Education: Goal: Ability to describe self-care measures  that may prevent or decrease complications (Diabetes Survival Skills Education) will improve 07/03/2023 1849 by Juleen Starr, RN Outcome: Adequate for Discharge 07/03/2023 1849 by Juleen Starr, RN Outcome: Adequate for Discharge 07/03/2023 1339 by Juleen Starr, RN Outcome: Progressing Goal: Individualized Educational Video(s) 07/03/2023 1849 by Juleen Starr, RN Outcome: Adequate for Discharge 07/03/2023 1849 by Juleen Starr, RN Outcome: Adequate for Discharge 07/03/2023 1339 by Juleen Starr, RN Outcome: Progressing   Problem: Coping: Goal: Ability to adjust to condition or change in health will improve 07/03/2023 1849 by Juleen Starr, RN Outcome: Adequate for Discharge 07/03/2023 1849 by Juleen Starr, RN Outcome: Adequate for Discharge 07/03/2023 1339 by Juleen Starr, RN Outcome: Progressing   Problem: Fluid Volume: Goal: Ability to maintain a balanced intake and output will improve 07/03/2023 1849 by Juleen Starr, RN Outcome: Adequate for Discharge 07/03/2023 1849 by Juleen Starr, RN Outcome: Adequate for Discharge 07/03/2023 1339 by Juleen Starr, RN Outcome: Progressing   Problem: Health Behavior/Discharge Planning: Goal: Ability to identify and utilize available resources and services will improve 07/03/2023 1849 by Juleen Starr, RN Outcome: Adequate for Discharge 07/03/2023 1849 by Juleen Starr, RN Outcome: Adequate for Discharge 07/03/2023 1339 by Juleen Starr, RN Outcome: Progressing Goal: Ability to manage health-related needs will improve 07/03/2023 1849 by Juleen Starr, RN Outcome: Adequate for Discharge 07/03/2023 1849 by Juleen Starr, RN Outcome: Adequate for Discharge 07/03/2023 1339 by Juleen Starr, RN Outcome: Progressing   Problem: Metabolic: Goal: Ability to maintain appropriate glucose levels will improve 07/03/2023 1849 by Juleen Starr, RN Outcome: Adequate for Discharge 07/03/2023 1849 by Juleen Starr, RN Outcome: Adequate for Discharge 07/03/2023 1339 by Juleen Starr, RN Outcome: Progressing   Problem: Nutritional: Goal: Maintenance of adequate nutrition will improve 07/03/2023 1849 by Juleen Starr, RN Outcome: Adequate for Discharge 07/03/2023 1849 by Juleen Starr, RN Outcome: Adequate for Discharge 07/03/2023 1339 by Juleen Starr, RN Outcome: Progressing Goal: Progress toward achieving an optimal weight will improve 07/03/2023 1849 by Juleen Starr, RN Outcome: Adequate for Discharge 07/03/2023 1849 by Juleen Starr, RN Outcome: Adequate for Discharge 07/03/2023 1339 by Juleen Starr, RN Outcome: Progressing   Problem: Skin Integrity: Goal: Risk for impaired skin integrity will decrease 07/03/2023 1849 by Juleen Starr, RN Outcome: Adequate for Discharge 07/03/2023 1849 by Juleen Starr, RN Outcome: Adequate for Discharge 07/03/2023 1339 by Juleen Starr, RN Outcome: Progressing   Problem: Tissue Perfusion: Goal: Adequacy of tissue perfusion will improve 07/03/2023 1849 by Juleen Starr, RN Outcome: Adequate for Discharge 07/03/2023 1849 by Juleen Starr, RN Outcome: Adequate for Discharge 07/03/2023 1339 by Juleen Starr, RN Outcome: Progressing   Problem: Activity: Goal: Ability to tolerate increased activity will improve 07/03/2023 1849 by Juleen Starr, RN Outcome: Adequate for Discharge 07/03/2023 1849 by Juleen Starr, RN Outcome: Adequate for Discharge 07/03/2023 1339 by Juleen Starr, RN Outcome: Progressing   Problem: Clinical Measurements: Goal: Ability to maintain a body temperature in the normal range will improve 07/03/2023 1849 by Juleen Starr, RN Outcome: Adequate for Discharge 07/03/2023 1849 by Juleen Starr, RN Outcome: Adequate for Discharge 07/03/2023 1339 by Juleen Starr, RN Outcome: Progressing   Problem: Respiratory: Goal: Ability to maintain adequate ventilation will improve 07/03/2023 1849 by Juleen Starr, RN Outcome: Adequate for Discharge 07/03/2023 1849 by Juleen Starr, RN Outcome: Adequate for Discharge 07/03/2023 1339 by Juleen Starr, RN Outcome: Progressing Goal: Ability to maintain a clear airway will improve 07/03/2023 1849 by Juleen Starr, RN Outcome: Adequate for Discharge 07/03/2023 1849 by Juleen Starr, RN Outcome: Adequate for Discharge 07/03/2023 1339 by  Juleen Starr, RN Outcome: Progressing

## 2023-07-03 NOTE — Plan of Care (Signed)
   Problem: Education: Goal: Knowledge of General Education information will improve Description: Including pain rating scale, medication(s)/side effects and non-pharmacologic comfort measures Outcome: Progressing   Problem: Health Behavior/Discharge Planning: Goal: Ability to manage health-related needs will improve Outcome: Progressing   Problem: Clinical Measurements: Goal: Ability to maintain clinical measurements within normal limits will improve Outcome: Progressing Goal: Will remain free from infection Outcome: Progressing Goal: Diagnostic test results will improve Outcome: Progressing Goal: Respiratory complications will improve Outcome: Progressing Goal: Cardiovascular complication will be avoided Outcome: Progressing   Problem: Activity: Goal: Risk for activity intolerance will decrease Outcome: Progressing   Problem: Nutrition: Goal: Adequate nutrition will be maintained Outcome: Progressing   Problem: Coping: Goal: Level of anxiety will decrease Outcome: Progressing   Problem: Elimination: Goal: Will not experience complications related to bowel motility Outcome: Progressing Goal: Will not experience complications related to urinary retention Outcome: Progressing   Problem: Pain Managment: Goal: General experience of comfort will improve and/or be controlled Outcome: Progressing   Problem: Safety: Goal: Ability to remain free from injury will improve Outcome: Progressing   Problem: Skin Integrity: Goal: Risk for impaired skin integrity will decrease Outcome: Progressing   Problem: Education: Goal: Ability to describe self-care measures that may prevent or decrease complications (Diabetes Survival Skills Education) will improve Outcome: Progressing Goal: Individualized Educational Video(s) Outcome: Progressing   Problem: Coping: Goal: Ability to adjust to condition or change in health will improve Outcome: Progressing   Problem: Fluid  Volume: Goal: Ability to maintain a balanced intake and output will improve Outcome: Progressing   Problem: Health Behavior/Discharge Planning: Goal: Ability to identify and utilize available resources and services will improve Outcome: Progressing Goal: Ability to manage health-related needs will improve Outcome: Progressing   Problem: Metabolic: Goal: Ability to maintain appropriate glucose levels will improve Outcome: Progressing   Problem: Nutritional: Goal: Maintenance of adequate nutrition will improve Outcome: Progressing Goal: Progress toward achieving an optimal weight will improve Outcome: Progressing   Problem: Skin Integrity: Goal: Risk for impaired skin integrity will decrease Outcome: Progressing   Problem: Tissue Perfusion: Goal: Adequacy of tissue perfusion will improve Outcome: Progressing   Problem: Activity: Goal: Ability to tolerate increased activity will improve Outcome: Progressing   Problem: Clinical Measurements: Goal: Ability to maintain a body temperature in the normal range will improve Outcome: Progressing   Problem: Respiratory: Goal: Ability to maintain adequate ventilation will improve Outcome: Progressing Goal: Ability to maintain a clear airway will improve Outcome: Progressing

## 2023-07-03 NOTE — Plan of Care (Signed)
 Problem: Education: Goal: Knowledge of General Education information will improve Description: Including pain rating scale, medication(s)/side effects and non-pharmacologic comfort measures 07/03/2023 1849 by Juleen Starr, RN Outcome: Adequate for Discharge 07/03/2023 1339 by Juleen Starr, RN Outcome: Progressing   Problem: Health Behavior/Discharge Planning: Goal: Ability to manage health-related needs will improve 07/03/2023 1849 by Juleen Starr, RN Outcome: Adequate for Discharge 07/03/2023 1339 by Juleen Starr, RN Outcome: Progressing   Problem: Clinical Measurements: Goal: Ability to maintain clinical measurements within normal limits will improve 07/03/2023 1849 by Juleen Starr, RN Outcome: Adequate for Discharge 07/03/2023 1339 by Juleen Starr, RN Outcome: Progressing Goal: Will remain free from infection 07/03/2023 1849 by Juleen Starr, RN Outcome: Adequate for Discharge 07/03/2023 1339 by Juleen Starr, RN Outcome: Progressing Goal: Diagnostic test results will improve 07/03/2023 1849 by Juleen Starr, RN Outcome: Adequate for Discharge 07/03/2023 1339 by Juleen Starr, RN Outcome: Progressing Goal: Respiratory complications will improve 07/03/2023 1849 by Juleen Starr, RN Outcome: Adequate for Discharge 07/03/2023 1339 by Juleen Starr, RN Outcome: Progressing Goal: Cardiovascular complication will be avoided 07/03/2023 1849 by Juleen Starr, RN Outcome: Adequate for Discharge 07/03/2023 1339 by Juleen Starr, RN Outcome: Progressing   Problem: Activity: Goal: Risk for activity intolerance will decrease 07/03/2023 1849 by Juleen Starr, RN Outcome: Adequate for Discharge 07/03/2023 1339 by Juleen Starr, RN Outcome: Progressing   Problem: Nutrition: Goal: Adequate nutrition will be maintained 07/03/2023 1849 by Juleen Starr, RN Outcome: Adequate for Discharge 07/03/2023 1339 by Juleen Starr, RN Outcome: Progressing   Problem: Coping: Goal: Level of anxiety will decrease 07/03/2023 1849 by  Juleen Starr, RN Outcome: Adequate for Discharge 07/03/2023 1339 by Juleen Starr, RN Outcome: Progressing   Problem: Elimination: Goal: Will not experience complications related to bowel motility 07/03/2023 1849 by Juleen Starr, RN Outcome: Adequate for Discharge 07/03/2023 1339 by Juleen Starr, RN Outcome: Progressing Goal: Will not experience complications related to urinary retention 07/03/2023 1849 by Juleen Starr, RN Outcome: Adequate for Discharge 07/03/2023 1339 by Juleen Starr, RN Outcome: Progressing   Problem: Pain Managment: Goal: General experience of comfort will improve and/or be controlled 07/03/2023 1849 by Juleen Starr, RN Outcome: Adequate for Discharge 07/03/2023 1339 by Juleen Starr, RN Outcome: Progressing   Problem: Safety: Goal: Ability to remain free from injury will improve 07/03/2023 1849 by Juleen Starr, RN Outcome: Adequate for Discharge 07/03/2023 1339 by Juleen Starr, RN Outcome: Progressing   Problem: Skin Integrity: Goal: Risk for impaired skin integrity will decrease 07/03/2023 1849 by Juleen Starr, RN Outcome: Adequate for Discharge 07/03/2023 1339 by Juleen Starr, RN Outcome: Progressing   Problem: Education: Goal: Ability to describe self-care measures that may prevent or decrease complications (Diabetes Survival Skills Education) will improve 07/03/2023 1849 by Juleen Starr, RN Outcome: Adequate for Discharge 07/03/2023 1339 by Juleen Starr, RN Outcome: Progressing Goal: Individualized Educational Video(s) 07/03/2023 1849 by Juleen Starr, RN Outcome: Adequate for Discharge 07/03/2023 1339 by Juleen Starr, RN Outcome: Progressing   Problem: Coping: Goal: Ability to adjust to condition or change in health will improve 07/03/2023 1849 by Juleen Starr, RN Outcome: Adequate for Discharge 07/03/2023 1339 by Juleen Starr, RN Outcome: Progressing   Problem: Fluid Volume: Goal: Ability to maintain a balanced intake and output will improve 07/03/2023 1849 by Juleen Starr, RN Outcome: Adequate for Discharge 07/03/2023 1339 by Juleen Starr, RN Outcome: Progressing   Problem: Health Behavior/Discharge Planning: Goal: Ability to identify and utilize available resources and services will improve 07/03/2023 1849 by Juleen Starr, RN Outcome: Adequate for Discharge 07/03/2023 1339  by Juleen Starr, RN Outcome: Progressing Goal: Ability to manage health-related needs will improve 07/03/2023 1849 by Juleen Starr, RN Outcome: Adequate for Discharge 07/03/2023 1339 by Juleen Starr, RN Outcome: Progressing   Problem: Metabolic: Goal: Ability to maintain appropriate glucose levels will improve 07/03/2023 1849 by Juleen Starr, RN Outcome: Adequate for Discharge 07/03/2023 1339 by Juleen Starr, RN Outcome: Progressing   Problem: Nutritional: Goal: Maintenance of adequate nutrition will improve 07/03/2023 1849 by Juleen Starr, RN Outcome: Adequate for Discharge 07/03/2023 1339 by Juleen Starr, RN Outcome: Progressing Goal: Progress toward achieving an optimal weight will improve 07/03/2023 1849 by Juleen Starr, RN Outcome: Adequate for Discharge 07/03/2023 1339 by Juleen Starr, RN Outcome: Progressing   Problem: Skin Integrity: Goal: Risk for impaired skin integrity will decrease 07/03/2023 1849 by Juleen Starr, RN Outcome: Adequate for Discharge 07/03/2023 1339 by Juleen Starr, RN Outcome: Progressing   Problem: Tissue Perfusion: Goal: Adequacy of tissue perfusion will improve 07/03/2023 1849 by Juleen Starr, RN Outcome: Adequate for Discharge 07/03/2023 1339 by Juleen Starr, RN Outcome: Progressing   Problem: Activity: Goal: Ability to tolerate increased activity will improve 07/03/2023 1849 by Juleen Starr, RN Outcome: Adequate for Discharge 07/03/2023 1339 by Juleen Starr, RN Outcome: Progressing   Problem: Clinical Measurements: Goal: Ability to maintain a body temperature in the normal range will improve 07/03/2023 1849 by Juleen Starr, RN Outcome: Adequate for  Discharge 07/03/2023 1339 by Juleen Starr, RN Outcome: Progressing   Problem: Respiratory: Goal: Ability to maintain adequate ventilation will improve 07/03/2023 1849 by Juleen Starr, RN Outcome: Adequate for Discharge 07/03/2023 1339 by Juleen Starr, RN Outcome: Progressing Goal: Ability to maintain a clear airway will improve 07/03/2023 1849 by Juleen Starr, RN Outcome: Adequate for Discharge 07/03/2023 1339 by Juleen Starr, RN Outcome: Progressing

## 2023-07-03 NOTE — Plan of Care (Signed)

## 2023-07-05 LAB — LEGIONELLA PNEUMOPHILA SEROGP 1 UR AG: L. pneumophila Serogp 1 Ur Ag: NEGATIVE

## 2023-07-07 LAB — CULTURE, BLOOD (ROUTINE X 2)
Culture: NO GROWTH
Culture: NO GROWTH
Special Requests: ADEQUATE
Special Requests: ADEQUATE

## 2023-07-14 ENCOUNTER — Ambulatory Visit (HOSPITAL_COMMUNITY): Admitting: Physician Assistant

## 2023-07-15 ENCOUNTER — Emergency Department (HOSPITAL_BASED_OUTPATIENT_CLINIC_OR_DEPARTMENT_OTHER)
Admission: EM | Admit: 2023-07-15 | Discharge: 2023-07-16 | Disposition: A | Discharge status: Home / Self Care | Attending: Emergency Medicine | Admitting: Emergency Medicine

## 2023-07-15 ENCOUNTER — Encounter (HOSPITAL_BASED_OUTPATIENT_CLINIC_OR_DEPARTMENT_OTHER): Payer: Self-pay | Admitting: Emergency Medicine

## 2023-07-15 ENCOUNTER — Emergency Department (HOSPITAL_BASED_OUTPATIENT_CLINIC_OR_DEPARTMENT_OTHER)

## 2023-07-15 ENCOUNTER — Other Ambulatory Visit: Payer: Self-pay

## 2023-07-15 DIAGNOSIS — E119 Type 2 diabetes mellitus without complications: Secondary | ICD-10-CM | POA: Diagnosis not present

## 2023-07-15 DIAGNOSIS — Z91148 Patient's other noncompliance with medication regimen for other reason: Secondary | ICD-10-CM | POA: Insufficient documentation

## 2023-07-15 DIAGNOSIS — I4892 Unspecified atrial flutter: Secondary | ICD-10-CM | POA: Diagnosis not present

## 2023-07-15 DIAGNOSIS — I447 Left bundle-branch block, unspecified: Secondary | ICD-10-CM | POA: Diagnosis not present

## 2023-07-15 DIAGNOSIS — R519 Headache, unspecified: Secondary | ICD-10-CM | POA: Diagnosis present

## 2023-07-15 DIAGNOSIS — Z79899 Other long term (current) drug therapy: Secondary | ICD-10-CM | POA: Diagnosis not present

## 2023-07-15 DIAGNOSIS — Z7901 Long term (current) use of anticoagulants: Secondary | ICD-10-CM | POA: Insufficient documentation

## 2023-07-15 DIAGNOSIS — D72829 Elevated white blood cell count, unspecified: Secondary | ICD-10-CM | POA: Diagnosis not present

## 2023-07-15 DIAGNOSIS — I4891 Unspecified atrial fibrillation: Secondary | ICD-10-CM | POA: Diagnosis not present

## 2023-07-15 HISTORY — DX: Unspecified atrial fibrillation: I48.91

## 2023-07-15 LAB — BASIC METABOLIC PANEL WITH GFR
Anion gap: 11 (ref 5–15)
BUN: 22 mg/dL (ref 8–23)
CO2: 22 mmol/L (ref 22–32)
Calcium: 9.4 mg/dL (ref 8.9–10.3)
Chloride: 106 mmol/L (ref 98–111)
Creatinine, Ser: 0.85 mg/dL (ref 0.44–1.00)
GFR, Estimated: >60 mL/min (ref >60)
Glucose, Bld: 293 mg/dL — ABNORMAL HIGH (ref 70–99)
Potassium: 3.9 mmol/L (ref 3.5–5.1)
Sodium: 139 mmol/L (ref 135–145)

## 2023-07-15 LAB — CBC
HCT: 49.8 % — ABNORMAL HIGH (ref 36.0–46.0)
Hemoglobin: 16.4 g/dL — ABNORMAL HIGH (ref 12.0–15.0)
MCH: 28.7 pg (ref 26.0–34.0)
MCHC: 32.9 g/dL (ref 30.0–36.0)
MCV: 87.1 fL (ref 80.0–100.0)
Platelets: 296 10*3/uL (ref 150–400)
RBC: 5.72 MIL/uL — ABNORMAL HIGH (ref 3.87–5.11)
RDW: 16.6 % — ABNORMAL HIGH (ref 11.5–15.5)
WBC: 10.7 10*3/uL — ABNORMAL HIGH (ref 4.0–10.5)
nRBC: 0 % (ref 0.0–0.2)

## 2023-07-15 LAB — BRAIN NATRIURETIC PEPTIDE: B Natriuretic Peptide: 118.7 pg/mL — ABNORMAL HIGH (ref 0.0–100.0)

## 2023-07-15 MED ORDER — DILTIAZEM HCL 25 MG/5ML IV SOLN: 5.0000 mg | Freq: Once | INTRAVENOUS | Status: DC

## 2023-07-15 MED ORDER — DILTIAZEM HCL-DEXTROSE 125-5 MG/125ML-% IV SOLN (PREMIX)
5.0000 mg/h | INTRAVENOUS | Status: DC
Start: 2023-07-15 — End: 2023-07-16
  Administered 2023-07-15: 5 mg/h via INTRAVENOUS
  Filled 2023-07-15: qty 125

## 2023-07-15 MED ORDER — SODIUM CHLORIDE 0.9 % IV BOLUS
500.0000 mL | Freq: Once | INTRAVENOUS | Status: AC
  Administered 2023-07-15: 500 mL via INTRAVENOUS

## 2023-07-15 NOTE — ED Notes (Signed)
 ED Provider at bedside.

## 2023-07-15 NOTE — ED Notes (Signed)
 Placed Pt. On 2liters of oxygen  due to low sats of 88 on RA,

## 2023-07-15 NOTE — Discharge Instructions (Signed)
 Take all your medications.  Follow-up with the atrial fibrillation clinic as you have been referred to in the past.  Return for any new or worse symptoms.  Glad that you converted back to sinus rhythm.

## 2023-07-15 NOTE — ED Provider Notes (Addendum)
 [place] [facility] Provider Note   CSN: [id number] Arrival date & time: 07/15/23  2002     History  Chief Complaint  Patient presents with   Facial Pain   Sore Throat    Kristin [name] is a 68 y.o. female.  Patient states she came in because rapid heart rate.  And she says that when she gets throat discomfort on the right side and also had a mild headache and some facial pain that usually means that her atrial fibrillation is up.  Patient supposed to be on Xarelto  and beta-blocker.  She thinks that they have held her cardia exam.  Patient's memory is not very good she says that she just kind of figured out today she had taken her meds for several days but she did take them all today.  Patient denies any chest pain but does admit to the palpitations.  Patient has been admitted twice this month.  Early in the month atrial fibs that required Dilt drip.  And then on April 5 through 6 was admitted for pneumonia respiratory failure with hypoxia.  There does appear to be some degree of confusion.  Patient called [phone] because she was in the parking lot and could not figure out how to get into this facility.  According to patient's discharge summary from the discharge on April 6 her medications include Omnicef  that she should be taking through the 10th but that may be completed then.  Zithromax  also through the 10th.  Supposed to be taking Cardizem  180 mg 24-hour capsule daily supposed to be taking flecainide  as well 50 mg twice a day.  Patient supposed to be on Lasix  20 mg every morning.  Mostly on magnesium  oxide.  Will check her magnesium  level.  Patient is supposed to be on Xarelto  once daily at suppertime.  And said patient supposed to be started newly on Lopressor  12.5 mg twice a day.  Definitely sounds the patient is confused about the Cardizem  and the Lopressor .  Past medical history sniffing for type 2 diabetes gastroesophageal reflux disease proximal atrial  fibrillation hyperlipidemia history of migraines left bundle branch block.  Past surgical history significant for tonsillectomy and 64 and gallbladder removal.  Patient is never used tobacco products.       Home Medications Prior to Admission medications   Medication Sig Start Date End Date Taking? Authorizing Provider  rivaroxaban  (XARELTO ) 20 MG TABS tablet Take 1 tablet (20 mg total) by mouth daily with supper. 02/23/21  Yes Jeffrie Oneil BROCKS, MD  acetaminophen -caffeine  (EXCEDRIN TENSION HEADACHE) 500-65 MG TABS per tablet Take 1 tablet by mouth as needed (headache, pain).    [provider]  albuterol  (VENTOLIN  HFA) 108 (90 Base) MCG/ACT inhaler Inhale 2 puffs into the lungs every 4 (four) hours as needed for wheezing or shortness of breath. 07/03/23   Shalhoub, Zachary PARAS, MD  atorvastatin  (LIPITOR) 20 MG tablet Take 20 mg by mouth at bedtime. 03/02/19   [provider]  buPROPion  (WELLBUTRIN  XL) 300 MG 24 hr tablet Take 300 mg by mouth every morning. 05/24/23   [provider]  cetirizine  (ZYRTEC ) 10 MG tablet Take 1 tablet (10 mg total) by mouth daily. 01/27/23   Jeneal Danita Macintosh, MD  Cholecalciferol  (VITAMIN D -3) 125 MCG (5000 UT) TABS Take 5,000 Units by mouth at bedtime.    [provider]  Cranberry-Vitamin C-Probiotic (AZO CRANBERRY PO) Take 1 tablet by mouth as needed.    [provider]  diclofenac  Sodium (VOLTAREN ) 1 % GEL Apply 2-4 g topically daily as needed (Lt leg pain.).    [provider]  diltiazem  (CARDIZEM  CD) 180 MG 24 hr capsule TAKE 1 CAPSULE(180 MG) BY MOUTH DAILY Patient taking differently: Take 180 mg by mouth daily. 08/30/22   Jeffrie Oneil BROCKS, MD  famotidine  (PEPCID ) 20 MG tablet TAKE 1 TABLET(20 MG) BY MOUTH DAILY Patient taking differently: Take 20 mg by mouth at bedtime. 06/02/23   Jeneal Danita Macintosh, MD  flecainide  (TAMBOCOR ) 50 MG tablet TAKE 1 TABLET(50 MG) BY MOUTH TWICE DAILY Patient taking  differently: Take 50 mg by mouth 2 (two) times daily. 12/10/22   Camnitz, Soyla Lunger, MD  furosemide  (LASIX ) 20 MG tablet Take 1 tablet (20 mg total) by mouth every morning. Patient taking differently: Take 20-40 mg by mouth every morning. 09/29/20   Gonfa, Taye T, MD  JARDIANCE  25 MG TABS tablet Take 1 tablet by mouth daily.    [provider]  magnesium  oxide (MAG-OX) 400 (240 Mg) MG tablet Take 400 mg by mouth daily.    [provider]  methylphenidate  (RITALIN ) 10 MG tablet Take 10 mg by mouth as needed (narcoleptic episodes when driving long distances.).    [provider]  metoprolol  tartrate (LOPRESSOR ) 25 MG tablet Take 0.5 tablets (12.5 mg total) by mouth 2 (two) times daily. 07/03/23 08/02/23  Shalhoub, Zachary PARAS, MD  metroNIDAZOLE (METROCREAM) 0.75 % cream Apply 1 Application topically 2 (two) times daily as needed.    [provider]  omeprazole  (PRILOSEC  OTC) 20 MG tablet Take 20 mg by mouth at bedtime.    [provider]  Polyethylene Glycol 400 (BLINK TEARS) 0.25 % SOLN Place 1 drop into both eyes daily as needed (dry eyes).    [provider]  potassium chloride  (KLOR-CON  M) 10 MEQ tablet Take 10 mEq by mouth daily. 05/03/23   [provider]  rOPINIRole  (REQUIP ) 2 MG tablet Take 1-2 mg by mouth See admin instructions. Take 1/2 tablet (1 mg) by mouth with supper as needed for restless legs, take 1 tablet (2 mg) daily at bedtime 07/29/20   [provider]  Semaglutide ,0.25 or 0.5MG /DOS, (OZEMPIC , 0.25 OR 0.5 MG/DOSE,) 2 MG/3ML SOPN Inject 1 mg into the skin once a week.    [provider]  tiZANidine (ZANAFLEX) 2 MG tablet Take 2 mg by mouth 3 (three) times daily as needed. 12/01/22   [provider]  Trospium  Chloride 60 MG CP24 TAKE 1 CAPSULE(60 MG) BY MOUTH DAILY Patient taking differently: Take 60 mg by mouth daily. 06/01/23   Guadlupe Lianne DASEN, MD  Vilazodone  HCl (VIIBRYD ) 40 MG TABS Take 40 mg by mouth daily.  05/25/23   [provider]      Allergies    Dilaudid [hydromorphone], Sulfa  antibiotics, Dilaudid [hydromorphone hcl], Irbesartan , Metformin  hcl, Sulindac, and Tramadol     Review of Systems   Review of Systems  Constitutional:  Negative for chills and fever.  HENT:  Positive for sore throat. Negative for ear pain.   Eyes:  Negative for pain and visual disturbance.  Respiratory:  Negative for cough and shortness of breath.   Cardiovascular:  Positive for palpitations. Negative for chest pain.  Gastrointestinal:  Negative for abdominal pain and vomiting.  Genitourinary:  Negative for dysuria and hematuria.  Musculoskeletal:  Negative for arthralgias and back pain.  Skin:  Negative for color change and rash.  Neurological:  Negative for seizures and syncope.  All  other systems reviewed and are negative.   Physical Exam Updated Vital Signs BP (!) 143/96 (BP Location: Left Arm)   Pulse 66   Temp 98.6 F (37 C)   Resp 18   Ht 1.6 m (5' 3)   Wt 113.4 kg   SpO2 96%   BMI 44.29 kg/m  Physical Exam Vitals and nursing note reviewed.  Constitutional:      General: She is not in acute distress.    Appearance: Normal appearance. She is well-developed. She is not ill-appearing.  HENT:     Head: Normocephalic and atraumatic.     Comments: No facial swelling.  No facial tenderness.    Mouth/Throat:     Mouth: Mucous membranes are moist.     Pharynx: Posterior oropharyngeal erythema present.  Eyes:     Conjunctiva/sclera: Conjunctivae normal.  Cardiovascular:     Rate and Rhythm: Tachycardia present. Rhythm irregular.     Heart sounds: No murmur heard. Pulmonary:     Effort: Pulmonary effort is normal. No respiratory distress.     Breath sounds: Normal breath sounds. No wheezing.  Abdominal:     Palpations: Abdomen is soft.     Tenderness: There is no abdominal tenderness.  Musculoskeletal:        General: No swelling.     Cervical back: Neck supple. No rigidity.      Right lower leg: No edema.     Left lower leg: No edema.  Skin:    General: Skin is warm and dry.     Capillary Refill: Capillary refill takes less than 2 seconds.  Neurological:     General: No focal deficit present.     Mental Status: She is alert. Mental status is at baseline.     Comments: Patient admits to some memory problems.  Psychiatric:        Mood and Affect: Mood normal.     ED Results / Procedures / Treatments   Labs (all labs ordered are listed, but only abnormal results are displayed) Labs Reviewed  BASIC METABOLIC PANEL WITH GFR - Abnormal; Notable for the following components:      Result Value   Glucose, Bld 293 (*)    All other components within normal limits  CBC - Abnormal; Notable for the following components:   WBC 10.7 (*)    RBC 5.72 (*)    Hemoglobin 16.4 (*)    HCT 49.8 (*)    RDW 16.6 (*)    All other components within normal limits  BRAIN NATRIURETIC PEPTIDE  TROPONIN I (HIGH SENSITIVITY)    EKG EKG Interpretation Date/Time:  Friday July 15 2023 20:08:02 EDT Ventricular Rate:  143 PR Interval:    QRS Duration:  141 QT Interval:  352 QTC Calculation: 543 R Axis:   -70  Text Interpretation: Atrial flutter Left bundle branch block Baseline wander in lead(s) II III aVL aVF V1 V3 V4 V5 V6 Interpretation limited secondary to artifact Suspect Atrial fib or flutter Confirmed by Denarius Sesler [phone]) on 07/15/2023 8:13:21 PM  Radiology No results found.  Procedures Procedures    Medications Ordered in ED Medications  diltiazem  (CARDIZEM ) 125 mg in dextrose  5% 125 mL (1 mg/mL) infusion (has no administration in time range)    ED Course/ Medical Decision Making/ A&P                                 Medical Decision  Making Amount and/or Complexity of Data Reviewed Labs: ordered. Radiology: ordered.  Risk Prescription drug management. Decision regarding hospitalization.   CRITICAL CARE Performed by: Bret Vanessen Total  critical care time: 45 minutes Critical care time was exclusive of separately billable procedures and treating other patients. Critical care was necessary to treat or prevent imminent or life-threatening deterioration. Critical care was time spent personally by me on the following activities: development of treatment plan with patient and/or surrogate as well as nursing, discussions with consultants, evaluation of patient's response to treatment, examination of patient, obtaining history from patient or surrogate, ordering and performing treatments and interventions, ordering and review of laboratory studies, ordering and review of radiographic studies, pulse oximetry and re-evaluation of patient's condition.   Patient presenting definitely with rapid probably atrial fibrillation.  Patient has pre-existing bundle branch blocks.  Patient not hypoxic but oxygen  sats on room air around 91%.  Patient did recently have CT angio this month.  That was negative for PEs.  Basic metabolic panel electrolytes normal and renal function is normal blood sugar up some at 293 CO2 is 22.  White blood cell count 10.7 hemoglobin 16.4 platelets 296.  BMP is pending and troponins are pending.  And portable chest is pending.  For the atrial fibrillation will start on diltiazem  drip.  Patient states she took her pills tonight so we will hold off on diltiazem  bolus.  Also do not feel comfortable cardioverting her because clearly she is confused about her medicines that she definitely filled up her 1 week medicine pack and and then realized today that all the pills were still there so it sounds like she has not had any medicine maybe for several days.  However she did take all of her meds tonight she said.  Patient has a high probability of needing admission.  I think if she is readmitted this will be her third admission this month I think social services consult would be important.  Patient seems to have some component of  dementia.  And lives at home by herself.  Also because of her recent history of pneumonia we will see what the chest x-ray shows.  But she should have completed both of her antibiotics on 10 April.  Patient's chest x-ray appears fairly clear.  Patient's blood pressure now a little marginal with systolic at 95.  But it is above 90.  Will start the diltiazem  drip at a really slow rate.  Also patient's oxygen  sats were around 91% so we did start her on 2 L.  Patient no respiratory distress.  Patient's blood pressure with 500 cc bolus has come up nicely to around 120.  We are titrating the drip.  But heart rate is still about 130.  Patient's BMP was reassuring at 118.7.  As mentioned I have reviewed the chest x-ray and I do not see any evidence of anything acute.  Discussed with hospitalist for admission for the atrial fibrillation with RVR.  Was planning on admitting the patient.  But she went had converted back to sinus rhythm.  Also her throat discomfort went away which she says is normal.  She now feels back to normal.  Repeat EKG showed that she is in sinus rhythm with her bundle branch blocks.  Patient stable for discharge home and follow-up with atrial fibrillation clinic.  She will return for any new or worse symptoms.   I think that she probably converted because of the medication she took at home right before she came  in.  We only had her on a very low dose of the diltiazem  drip.  I do not think that that played a significant role.  Final Clinical Impression(s) / ED Diagnoses Final diagnoses:  Atrial fibrillation with RVR [facility])    Rx / DC Orders ED Discharge Orders     None         Geraldene Hamilton, MD 07/15/23 [id number]    Geraldene Hamilton, MD 07/15/23 7850    Geraldene Hamilton, MD 07/15/23 2231    Geraldene Hamilton, MD 07/15/23 2314    Geraldene Hamilton, MD 07/15/23 2315

## 2023-07-15 NOTE — ED Triage Notes (Addendum)
 Patient reports facial pain, ear pain, sore throat and headache onset 6 pm today. Reports these sx are related to her a-fib. On Xarelto . Patient reports she accidentally missed her medications this morning. Denies chest pain, palpations, shob.

## 2023-07-15 NOTE — ED Notes (Signed)
 Pt. Forgot to take her meds until 6pm tonight.

## 2023-07-16 NOTE — ED Notes (Signed)
 Patient assisted to car for discharge via wheelchair with tech

## 2023-08-02 ENCOUNTER — Ambulatory Visit (HOSPITAL_COMMUNITY): Admitting: Physician Assistant

## 2023-08-24 ENCOUNTER — Emergency Department (HOSPITAL_BASED_OUTPATIENT_CLINIC_OR_DEPARTMENT_OTHER)
Admission: EM | Admit: 2023-08-24 | Discharge: 2023-08-24 | Disposition: A | Discharge status: Home / Self Care | Attending: Emergency Medicine | Admitting: Emergency Medicine

## 2023-08-24 ENCOUNTER — Encounter (HOSPITAL_BASED_OUTPATIENT_CLINIC_OR_DEPARTMENT_OTHER): Payer: Self-pay | Admitting: Emergency Medicine

## 2023-08-24 ENCOUNTER — Emergency Department (HOSPITAL_BASED_OUTPATIENT_CLINIC_OR_DEPARTMENT_OTHER)

## 2023-08-24 ENCOUNTER — Encounter (HOSPITAL_COMMUNITY): Payer: Self-pay

## 2023-08-24 DIAGNOSIS — R600 Localized edema: Secondary | ICD-10-CM | POA: Insufficient documentation

## 2023-08-24 DIAGNOSIS — E1165 Type 2 diabetes mellitus with hyperglycemia: Secondary | ICD-10-CM | POA: Diagnosis not present

## 2023-08-24 DIAGNOSIS — I4891 Unspecified atrial fibrillation: Secondary | ICD-10-CM

## 2023-08-24 DIAGNOSIS — Z7984 Long term (current) use of oral hypoglycemic drugs: Secondary | ICD-10-CM | POA: Insufficient documentation

## 2023-08-24 DIAGNOSIS — I447 Left bundle-branch block, unspecified: Secondary | ICD-10-CM | POA: Insufficient documentation

## 2023-08-24 DIAGNOSIS — R000 Tachycardia, unspecified: Secondary | ICD-10-CM | POA: Insufficient documentation

## 2023-08-24 DIAGNOSIS — Z7985 Long-term (current) use of injectable non-insulin antidiabetic drugs: Secondary | ICD-10-CM | POA: Insufficient documentation

## 2023-08-24 DIAGNOSIS — D45 Polycythemia vera: Secondary | ICD-10-CM | POA: Insufficient documentation

## 2023-08-24 DIAGNOSIS — R739 Hyperglycemia, unspecified: Secondary | ICD-10-CM

## 2023-08-24 DIAGNOSIS — D751 Secondary polycythemia: Secondary | ICD-10-CM

## 2023-08-24 DIAGNOSIS — Z7901 Long term (current) use of anticoagulants: Secondary | ICD-10-CM | POA: Diagnosis not present

## 2023-08-24 LAB — CBC WITH DIFFERENTIAL/PLATELET
Abs Immature Granulocytes: 0.04 10*3/uL (ref 0.00–0.07)
Basophils Absolute: 0.1 10*3/uL (ref 0.0–0.1)
Basophils Relative: 1 %
Eosinophils Absolute: 0.2 10*3/uL (ref 0.0–0.5)
Eosinophils Relative: 2 %
HCT: 48 % — ABNORMAL HIGH (ref 36.0–46.0)
Hemoglobin: 15.7 g/dL — ABNORMAL HIGH (ref 12.0–15.0)
Immature Granulocytes: 0 %
Lymphocytes Relative: 13 %
Lymphs Abs: 1.3 10*3/uL (ref 0.7–4.0)
MCH: 28.8 pg (ref 26.0–34.0)
MCHC: 32.7 g/dL (ref 30.0–36.0)
MCV: 87.9 fL (ref 80.0–100.0)
Monocytes Absolute: 0.7 10*3/uL (ref 0.1–1.0)
Monocytes Relative: 7 %
Neutro Abs: 8 10*3/uL — ABNORMAL HIGH (ref 1.7–7.7)
Neutrophils Relative %: 77 %
Platelets: 275 10*3/uL (ref 150–400)
RBC: 5.46 MIL/uL — ABNORMAL HIGH (ref 3.87–5.11)
RDW: 14.8 % (ref 11.5–15.5)
WBC: 10.2 10*3/uL (ref 4.0–10.5)
nRBC: 0 % (ref 0.0–0.2)

## 2023-08-24 LAB — BASIC METABOLIC PANEL WITH GFR
Anion gap: 16 — ABNORMAL HIGH (ref 5–15)
BUN: 23 mg/dL (ref 8–23)
CO2: 25 mmol/L (ref 22–32)
Calcium: 10 mg/dL (ref 8.9–10.3)
Chloride: 96 mmol/L — ABNORMAL LOW (ref 98–111)
Creatinine, Ser: 0.93 mg/dL (ref 0.44–1.00)
GFR, Estimated: >60 mL/min (ref >60)
Glucose, Bld: 535 mg/dL (ref 70–99)
Potassium: 4.2 mmol/L (ref 3.5–5.1)
Sodium: 137 mmol/L (ref 135–145)

## 2023-08-24 LAB — URINALYSIS, W/ REFLEX TO CULTURE (INFECTION SUSPECTED)
Bilirubin Urine: NEGATIVE
Glucose, UA: >=500 mg/dL — AB
Hgb urine dipstick: NEGATIVE
Ketones, ur: NEGATIVE mg/dL
Leukocytes,Ua: NEGATIVE
Nitrite: NEGATIVE
Protein, ur: NEGATIVE mg/dL
Specific Gravity, Urine: 1.01 (ref 1.005–1.030)
pH: 5 (ref 5.0–8.0)

## 2023-08-24 LAB — HEPATIC FUNCTION PANEL
ALT: 20 U/L (ref 0–44)
AST: 16 U/L (ref 15–41)
Albumin: 4 g/dL (ref 3.5–5.0)
Alkaline Phosphatase: 78 U/L (ref 38–126)
Bilirubin, Direct: 0.1 mg/dL (ref 0.0–0.2)
Indirect Bilirubin: 0.2 mg/dL — ABNORMAL LOW (ref 0.3–0.9)
Total Bilirubin: 0.4 mg/dL (ref 0.0–1.2)
Total Protein: 6 g/dL — ABNORMAL LOW (ref 6.5–8.1)

## 2023-08-24 LAB — T4, FREE: Free T4: 0.83 ng/dL (ref 0.61–1.12)

## 2023-08-24 LAB — PRO BRAIN NATRIURETIC PEPTIDE: Pro Brain Natriuretic Peptide: 247 pg/mL (ref <300.0)

## 2023-08-24 LAB — D-DIMER, QUANTITATIVE: D-Dimer, Quant: 0.34 ug{FEU}/mL (ref 0.00–0.50)

## 2023-08-24 LAB — RESP PANEL BY RT-PCR (RSV, FLU A&B, COVID)  RVPGX2
Influenza A by PCR: NEGATIVE
Influenza B by PCR: NEGATIVE
Resp Syncytial Virus by PCR: NEGATIVE
SARS Coronavirus 2 by RT PCR: NEGATIVE

## 2023-08-24 LAB — CBG MONITORING, ED: Glucose-Capillary: 369 mg/dL — ABNORMAL HIGH (ref 70–99)

## 2023-08-24 LAB — TROPONIN T, HIGH SENSITIVITY
Troponin T High Sensitivity: <15 ng/L (ref <19)
Troponin T High Sensitivity: <15 ng/L (ref <19)

## 2023-08-24 LAB — GROUP A STREP BY PCR: Group A Strep by PCR: NOT DETECTED

## 2023-08-24 LAB — TSH: TSH: 2.453 u[IU]/mL (ref 0.350–4.500)

## 2023-08-24 MED ORDER — METOPROLOL TARTRATE 5 MG/5ML IV SOLN
2.5000 mg | Freq: Once | INTRAVENOUS | Status: AC
  Administered 2023-08-24: 2.5 mg via INTRAVENOUS
  Filled 2023-08-24: qty 5

## 2023-08-24 MED ORDER — INSULIN ASPART 100 UNIT/ML IV SOLN
10.0000 [IU] | Freq: Once | INTRAVENOUS | Status: AC
  Administered 2023-08-24: 10 [IU] via INTRAVENOUS

## 2023-08-24 NOTE — ED Provider Notes (Signed)
 [place] [facility] Provider Note   CSN: [id number] Arrival date & time: 08/24/23  0307     History  Chief Complaint  Patient presents with   Irregular Heart Beat    Kristin [name] is a 68 y.o. female.  The history is provided by the patient.  She has history of diabetes, hyperlipidemia, paroxysmal atrial fibrillation anticoagulated on rivaroxaban  and comes in because she thinks she is in atrial fibrillation again.  She usually develops some throat pain when she goes into atrial fibrillation.  Tonight she noted her throat was hurting and she checked her heart rate and it was elevated.  She denies chest pain, dyspnea, nausea, vomiting, diaphoresis.   Home Medications Prior to Admission medications   Medication Sig Start Date End Date Taking? Authorizing Provider  acetaminophen -caffeine  (EXCEDRIN TENSION HEADACHE) 500-65 MG TABS per tablet Take 1 tablet by mouth as needed (headache, pain).    [provider]  albuterol  (VENTOLIN  HFA) 108 (90 Base) MCG/ACT inhaler Inhale 2 puffs into the lungs every 4 (four) hours as needed for wheezing or shortness of breath. 07/03/23   Shalhoub, Merrill Abide, MD  atorvastatin  (LIPITOR) 20 MG tablet Take 20 mg by mouth at bedtime. 03/02/19   [provider]  buPROPion  (WELLBUTRIN  XL) 300 MG 24 hr tablet Take 300 mg by mouth every morning. 05/24/23   [provider]  cetirizine  (ZYRTEC ) 10 MG tablet Take 1 tablet (10 mg total) by mouth daily. 01/27/23   Brian Campanile, MD  Cholecalciferol  (VITAMIN D -3) 125 MCG (5000 UT) TABS Take 5,000 Units by mouth at bedtime.    [provider]  Cranberry-Vitamin C-Probiotic (AZO CRANBERRY PO) Take 1 tablet by mouth as needed.    [provider]  diclofenac  Sodium (VOLTAREN ) 1 % GEL Apply 2-4 g topically daily as needed (Lt leg pain.).    [provider]  diltiazem  (CARDIZEM  CD) 180 MG 24 hr capsule TAKE 1 CAPSULE(180 MG) BY  MOUTH DAILY Patient taking differently: Take 180 mg by mouth daily. 08/30/22   Hugh Madura, MD  famotidine  (PEPCID ) 20 MG tablet TAKE 1 TABLET(20 MG) BY MOUTH DAILY Patient taking differently: Take 20 mg by mouth at bedtime. 06/02/23   Brian Campanile, MD  flecainide  (TAMBOCOR ) 50 MG tablet TAKE 1 TABLET(50 MG) BY MOUTH TWICE DAILY Patient taking differently: Take 50 mg by mouth 2 (two) times daily. 12/10/22   Camnitz, Babetta Lesch, MD  furosemide  (LASIX ) 20 MG tablet Take 1 tablet (20 mg total) by mouth every morning. Patient taking differently: Take 20-40 mg by mouth every morning. 09/29/20   Gonfa, Taye T, MD  JARDIANCE  25 MG TABS tablet Take 1 tablet by mouth daily.    [provider]  magnesium  oxide (MAG-OX) 400 (240 Mg) MG tablet Take 400 mg by mouth daily.    [provider]  methylphenidate  (RITALIN ) 10 MG tablet Take 10 mg by mouth as needed (narcoleptic episodes when driving long distances.).    [provider]  metoprolol  tartrate (LOPRESSOR ) 25 MG tablet Take 0.5 tablets (12.5 mg total) by mouth 2 (two) times daily. 07/03/23 08/02/23  Shalhoub, Merrill Abide, MD  metroNIDAZOLE (METROCREAM) 0.75 % cream Apply 1 Application topically 2 (two) times daily as needed.    [provider]  omeprazole  (PRILOSEC  OTC) 20 MG tablet Take 20 mg by mouth at bedtime.    [provider]  Polyethylene Glycol 400 (BLINK TEARS) 0.25 % SOLN Place 1 drop  into both eyes daily as needed (dry eyes).    [provider]  potassium chloride  (KLOR-CON  M) 10 MEQ tablet Take 10 mEq by mouth daily. 05/03/23   [provider]  rivaroxaban  (XARELTO ) 20 MG TABS tablet Take 1 tablet (20 mg total) by mouth daily with supper. 02/23/21   Hugh Madura, MD  rOPINIRole  (REQUIP ) 2 MG tablet Take 1-2 mg by mouth See admin instructions. Take 1/2 tablet (1 mg) by mouth with supper as needed for restless legs, take 1 tablet (2 mg) daily at bedtime 07/29/20   [provider]  Semaglutide ,0.25 or 0.5MG /DOS, (OZEMPIC , 0.25 OR 0.5 MG/DOSE,) 2 MG/3ML SOPN Inject 1 mg into the skin once a week.    [provider]  tiZANidine (ZANAFLEX) 2 MG tablet Take 2 mg by mouth 3 (three) times daily as needed. 12/01/22   [provider]  Trospium  Chloride 60 MG CP24 TAKE 1 CAPSULE(60 MG) BY MOUTH DAILY Patient taking differently: Take 60 mg by mouth daily. 06/01/23   Darlene Ehlers, MD  Vilazodone  HCl (VIIBRYD ) 40 MG TABS Take 40 mg by mouth daily. 05/25/23   [provider]      Allergies    Dilaudid [hydromorphone], Sulfa  antibiotics, Dilaudid [hydromorphone hcl], Irbesartan , Metformin  hcl, Sulindac, and Tramadol     Review of Systems   Review of Systems  All other systems reviewed and are negative.   Physical Exam Updated Vital Signs BP 119/84   Pulse (!) 116   Temp 97.8 F (36.6 C) (Oral)   Resp (!) 21   Ht 5\' 3"  (1.6 m)   Wt 115.7 kg   SpO2 93%   BMI 45.17 kg/m  Physical Exam Vitals and nursing note reviewed.   68 year old female, resting comfortably and in no acute distress. Vital signs are significant for elevated heart rate. Oxygen  saturation is 90%, which is normal. Head is normocephalic and atraumatic. PERRLA, EOMI. Oropharynx is clear. Neck is nontender and supple without adenopathy or JVD. Back is nontender and there is no CVA tenderness. Lungs are clear without rales, wheezes, or rhonchi. Chest is nontender. Heart is tachycardic without murmur. Abdomen is soft, flat, nontender without masses or hepatosplenomegaly and peristalsis is normoactive. Extremities have 1+ edema. Neurologic: Awake and alert, moves all extremities equally.  ED Results / Procedures / Treatments   Labs (all labs ordered are listed, but only abnormal results are displayed) Labs Reviewed  BASIC METABOLIC PANEL WITH GFR - Abnormal; Notable for the following components:      Result Value   Chloride 96 (*)    Glucose, Bld 535 (*)    Anion gap  16 (*)    All other components within normal limits  CBC WITH DIFFERENTIAL/PLATELET - Abnormal; Notable for the following components:   RBC 5.46 (*)    Hemoglobin 15.7 (*)    HCT 48.0 (*)    Neutro Abs 8.0 (*)    All other components within normal limits  CBG MONITORING, ED - Abnormal; Notable for the following components:   Glucose-Capillary 369 (*)    All other components within normal limits  TROPONIN T, HIGH SENSITIVITY  TROPONIN T, HIGH SENSITIVITY    EKG EKG Interpretation Date/Time:  Wednesday Aug 24 2023 03:28:51 EDT Ventricular Rate:  114 PR Interval:  99 QRS Duration:  157 QT Interval:  394 QTC Calculation: 543 R Axis:   253  Text Interpretation: Sinus tachycardia Left bundle branch block When compared with ECG of 07/15/2023, HEART RATE  has increased Confirmed by Alissa April ([id number]) on 08/24/2023 3:37:03 AM  No results found.  Procedures Procedures  Cardiac monitor shows sinus tachycardia, per my interpretation.  Medications Ordered in ED Medications  metoprolol  tartrate (LOPRESSOR ) injection 2.5 mg (2.5 mg Intravenous Given 08/24/23 0413)  insulin  aspart (novoLOG ) injection 10 Units (10 Units Intravenous Given 08/24/23 0530)  metoprolol  tartrate (LOPRESSOR ) injection 2.5 mg (2.5 mg Intravenous Given 08/24/23 0615)    ED Course/ Medical Decision Making/ A&P                                 Medical Decision Making Amount and/or Complexity of Data Reviewed Labs: ordered.  Risk OTC drugs. Prescription drug management.   Rapid heart rate which is appears to be sinus tachycardia as opposed to atrial fibrillation.  I have reviewed her electrocardiogram, and my interpretation is sinus tachycardia with left bundle branch block, heart rate increased compared with prior.  I suspect the throat pain she has is actually an angina equivalent related to her heart rate.  She is on very low-dose metoprolol , I have ordered a dose of IV metoprolol .  I have reviewed her past  records, and she has ED visits on 07/15/2023 and 06/29/2023 for atrial fibrillation which spontaneously converted.  Heart rate has only come down slightly with metoprolol  and I ordered a second dose of metoprolol .  I reviewed her laboratory test, my interpretation is stable mild polycythemia, markedly elevated glucose.  I ordered a dose of intravenous NovoLog  and glucoses come down to a reasonable level.  Troponin is normal x 2.  However, she continues to be tachycardic.  Case signed out to Dr. Tamela Fake, oncoming physician.  CRITICAL CARE Performed by: Alissa April Total critical care time: 40 minutes Critical care time was exclusive of separately billable procedures and treating other patients. Critical care was necessary to treat or prevent imminent or life-threatening deterioration. Critical care was time spent personally by me on the following activities: development of treatment plan with patient and/or surrogate as well as nursing, discussions with consultants, evaluation of patient's response to treatment, examination of patient, obtaining history from patient or surrogate, ordering and performing treatments and interventions, ordering and review of laboratory studies, ordering and review of radiographic studies, pulse oximetry and re-evaluation of patient's condition.  Final Clinical Impression(s) / ED Diagnoses Final diagnoses:  Sinus tachycardia  Hyperglycemia  Polycythemia    Rx / DC Orders ED Discharge Orders     None         Alissa April, MD 08/24/23 [phone]

## 2023-08-24 NOTE — ED Provider Notes (Signed)
  Physical Exam  BP 119/84   Pulse (!) 116   Temp 97.8 F (36.6 C) (Oral)   Resp (!) 21   Ht 5\' 3"  (1.6 m)   Wt 115.7 kg   SpO2 93%   BMI 45.17 kg/m   Physical Exam  Procedures  Procedures  ED Course / MDM     68 yo female presented with concern for atrial fibrillation due to tachycardia, throat pain. Given metoprolol  this AM.  Received care of patient from Dr. Candelaria Chaco.   Presented initially with tachycardia.  Difficult to say on EKGs obtained whether tachycardia is sinus or a flutter with one-to-one.  A repeat EKG with rate in the 120s looked more convincingly like atrial fibrillation.  Given her initial tachycardia, ordered labs to further evaluate this, including a D-dimer which was negative and have low suspicion for PE in the setting of her being on anticoagulation, no signs of transaminitis, strep throat, no signs of viral etiology of tachycardia such as COVID, influenza or RSV.  There are no clear signs of urinary tract infection.  Her troponin was negative x 2 and have low suspicion for ACS.  She did have hyperglycemia without signs of acidosis and a normal bicarb of 25.  No leukocytosis suggest sepsis.  Did order TSH and free T4 given tachycardia.  While she was in the emergency department her heart rate decreased down to the 60s and 70s and repeat EKG shows sinus rhythm.  Suspect she was initially in atrial fibrillation or atrial flutter and has now converted.  Recommend follow-up with the atrial fibrillation clinic. Will increase metoprolol  dosing in AM. Patient discharged in stable condition with understanding of reasons to return.      Kristin Currier, MD 08/24/23 [phone]

## 2023-08-24 NOTE — ED Notes (Signed)
 Pt is incont of urine pt had soaked 2 pullups with 2 large pads inside.   Pt changed and bed changed

## 2023-08-24 NOTE — Discharge Instructions (Addendum)
 In the morning, take 25mg  of your metoprolol  (1 tablet) in the morning, and continue 12.5mg  at night.

## 2023-08-24 NOTE — ED Triage Notes (Signed)
 Pt states feels like she has A-fib, used at home HR monitor and was in 120's, Hx of same. States has throat pain when it happens.

## 2023-08-24 NOTE — ED Notes (Signed)
 Pt alert and oriented X 4 at the time of discharge. RR even and unlabored. No acute distress noted. Pt verbalized understanding of discharge instructions as discussed. Pt transported in wheelchair to lobby at time of discharge.

## 2023-08-25 ENCOUNTER — Other Ambulatory Visit: Payer: Self-pay | Admitting: Cardiology

## 2023-08-27 LAB — URINE CULTURE: Culture: >=100000 — AB

## 2023-08-28 ENCOUNTER — Telehealth (HOSPITAL_BASED_OUTPATIENT_CLINIC_OR_DEPARTMENT_OTHER): Payer: Self-pay | Admitting: *Deleted

## 2023-08-28 NOTE — Telephone Encounter (Signed)
 Post ED Visit - Positive Culture Follow-up  Culture report reviewed by antimicrobial stewardship pharmacist: [facility] []  Court Distance, Pharm.D. []  Skeet Duke, Pharm.D., BCPS AQ-ID []  Leslee Rase, Pharm.D., BCPS []  Garland Junk, Pharm.D., BCPS []  [place], [place].D., BCPS, AAHIVP []  Alcide Aly, Pharm.D., BCPS, AAHIVP []  Jerri Morale, PharmD, BCPS []  Graham Laws, PharmD, BCPS []  Cleda Curly, PharmD, BCPS []  Tamar Fairly, PharmD []  Ballard Levels, PharmD, BCPS [x]  Trinidad Funk, PharmD  [facility] []  Arlyne Bering, PharmD []  Sherryle Don, PharmD []  Van Gelinas, PharmD []  Delila Felty, Rph []  Luna [place]) Cleora Daft, PharmD []  Augustina Block, PharmD []  Arie Kurtz, PharmD []  Sharlyn Deaner, PharmD []  Agnes Hose, PharmD []  Kendall Pauls, PharmD []  Gladstone Lamer, PharmD []  Armanda Bern, PharmD []  Tera Fellows, PharmD   Positive urine culture Do not treat per Dorothey Gate, PA-C  Kristin [name] 08/28/2023, 10:23 AM

## 2023-09-06 ENCOUNTER — Ambulatory Visit (HOSPITAL_COMMUNITY)
Admission: RE | Admit: 2023-09-06 | Discharge: 2023-09-06 | Disposition: A | Discharge status: Home / Self Care | Source: Ambulatory Visit | Attending: Physician Assistant | Admitting: Physician Assistant

## 2023-09-06 VITALS — BP 130/84 | HR 71 | Ht 63.0 in | Wt 255.8 lb

## 2023-09-06 DIAGNOSIS — Z5181 Encounter for therapeutic drug level monitoring: Secondary | ICD-10-CM

## 2023-09-06 DIAGNOSIS — I4891 Unspecified atrial fibrillation: Secondary | ICD-10-CM | POA: Diagnosis not present

## 2023-09-06 DIAGNOSIS — D6869 Other thrombophilia: Secondary | ICD-10-CM | POA: Diagnosis not present

## 2023-09-06 DIAGNOSIS — Z79899 Other long term (current) drug therapy: Secondary | ICD-10-CM | POA: Diagnosis not present

## 2023-09-06 DIAGNOSIS — I480 Paroxysmal atrial fibrillation: Secondary | ICD-10-CM | POA: Diagnosis not present

## 2023-09-06 MED ORDER — METOPROLOL TARTRATE 25 MG PO TABS: 25.0000 mg | ORAL_TABLET | Freq: Two times a day (BID) | ORAL | 2 refills | Status: DC

## 2023-09-06 NOTE — Patient Instructions (Signed)
Increase Metoprolol to 25 mg twice a day.
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
## 2023-09-24 ENCOUNTER — Other Ambulatory Visit: Payer: Self-pay | Admitting: Cardiology

## 2023-09-28 ENCOUNTER — Other Ambulatory Visit: Payer: Self-pay | Admitting: Obstetrics

## 2023-09-28 ENCOUNTER — Other Ambulatory Visit: Payer: Self-pay | Admitting: Cardiology

## 2023-09-28 DIAGNOSIS — N3946 Mixed incontinence: Secondary | ICD-10-CM

## 2023-09-28 DIAGNOSIS — R351 Nocturia: Secondary | ICD-10-CM

## 2023-10-10 ENCOUNTER — Encounter (HOSPITAL_COMMUNITY): Payer: Self-pay | Admitting: Physician Assistant

## 2023-10-10 ENCOUNTER — Ambulatory Visit (HOSPITAL_COMMUNITY)
Admission: RE | Admit: 2023-10-10 | Discharge: 2023-10-10 | Disposition: A | Discharge status: Home / Self Care | Source: Ambulatory Visit | Attending: Physician Assistant | Admitting: Physician Assistant

## 2023-10-10 VITALS — BP 134/82 | HR 83 | Ht 63.0 in | Wt 253.0 lb

## 2023-10-10 DIAGNOSIS — Z5181 Encounter for therapeutic drug level monitoring: Secondary | ICD-10-CM

## 2023-10-10 DIAGNOSIS — I480 Paroxysmal atrial fibrillation: Secondary | ICD-10-CM | POA: Diagnosis not present

## 2023-10-10 DIAGNOSIS — D6869 Other thrombophilia: Secondary | ICD-10-CM

## 2023-10-10 DIAGNOSIS — Z79899 Other long term (current) drug therapy: Secondary | ICD-10-CM | POA: Diagnosis not present

## 2023-10-10 MED ORDER — DILTIAZEM HCL 30 MG PO TABS: ORAL_TABLET | ORAL | 1 refills | Status: DC

## 2023-10-10 NOTE — Patient Instructions (Signed)
Cardizem 30mg  -- Take 1 tablet every 4 hours AS NEEDED for AFIB heart rate >100 as long as top BP >100.
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
## 2023-11-02 ENCOUNTER — Other Ambulatory Visit (HOSPITAL_COMMUNITY): Payer: Self-pay | Admitting: Physician Assistant

## 2023-11-04 ENCOUNTER — Other Ambulatory Visit: Payer: Self-pay | Admitting: Cardiology

## 2023-11-17 ENCOUNTER — Other Ambulatory Visit (HOSPITAL_COMMUNITY): Payer: Self-pay | Admitting: Physician Assistant

## 2023-11-26 ENCOUNTER — Other Ambulatory Visit: Payer: Self-pay | Admitting: Cardiology

## 2023-11-29 ENCOUNTER — Encounter: Payer: Self-pay | Admitting: Family Medicine

## 2023-11-30 ENCOUNTER — Ambulatory Visit: Admitting: Urology

## 2023-11-30 ENCOUNTER — Encounter: Payer: Self-pay | Admitting: Urology

## 2023-11-30 VITALS — BP 96/58 | HR 85 | Ht 63.0 in | Wt 255.0 lb

## 2023-11-30 DIAGNOSIS — N952 Postmenopausal atrophic vaginitis: Secondary | ICD-10-CM | POA: Diagnosis not present

## 2023-11-30 DIAGNOSIS — Z87440 Personal history of urinary (tract) infections: Secondary | ICD-10-CM | POA: Diagnosis not present

## 2023-11-30 DIAGNOSIS — N3281 Overactive bladder: Secondary | ICD-10-CM | POA: Diagnosis not present

## 2023-11-30 DIAGNOSIS — N390 Urinary tract infection, site not specified: Secondary | ICD-10-CM

## 2023-11-30 LAB — URINALYSIS, ROUTINE W REFLEX MICROSCOPIC
Bilirubin, UA: NEGATIVE
Ketones, UA: NEGATIVE
Leukocytes,UA: NEGATIVE
Nitrite, UA: NEGATIVE
Protein,UA: NEGATIVE
RBC, UA: NEGATIVE
Specific Gravity, UA: 1.015 (ref 1.005–1.030)
Urobilinogen, Ur: 0.2 mg/dL (ref 0.2–1.0)
pH, UA: 5 (ref 5.0–7.5)

## 2023-11-30 LAB — MICROSCOPIC EXAMINATION: Crystals: POSITIVE — AB

## 2023-11-30 LAB — BLADDER SCAN AMB NON-IMAGING: Scan Result: 11

## 2023-11-30 MED ORDER — METHENAMINE HIPPURATE 1 G PO TABS: 1.0000 g | ORAL_TABLET | Freq: Two times a day (BID) | ORAL | 11 refills | Status: AC

## 2023-11-30 MED ORDER — ESTRADIOL 0.1 MG/GM VA CREA: TOPICAL_CREAM | VAGINAL | 3 refills | Status: AC
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
## 2023-12-15 ENCOUNTER — Other Ambulatory Visit: Payer: Self-pay | Admitting: Cardiology

## 2023-12-16 ENCOUNTER — Other Ambulatory Visit (HOSPITAL_COMMUNITY): Payer: Self-pay | Admitting: *Deleted

## 2023-12-16 DIAGNOSIS — I480 Paroxysmal atrial fibrillation: Secondary | ICD-10-CM

## 2023-12-16 MED ORDER — DILTIAZEM HCL ER COATED BEADS 180 MG PO CP24: 180.0000 mg | ORAL_CAPSULE | Freq: Every day | ORAL | 6 refills | Status: AC

## 2023-12-16 MED ORDER — RIVAROXABAN 20 MG PO TABS: 20.0000 mg | ORAL_TABLET | Freq: Every day | ORAL | 3 refills | Status: AC

## 2024-01-10 ENCOUNTER — Ambulatory Visit (HOSPITAL_COMMUNITY)
Admission: RE | Admit: 2024-01-10 | Discharge: 2024-01-10 | Disposition: A | Discharge status: Home / Self Care | Source: Ambulatory Visit | Attending: Physician Assistant | Admitting: Physician Assistant

## 2024-01-10 VITALS — BP 120/72 | HR 73 | Ht 63.0 in | Wt 251.4 lb

## 2024-01-10 DIAGNOSIS — I4891 Unspecified atrial fibrillation: Secondary | ICD-10-CM | POA: Diagnosis not present

## 2024-01-10 DIAGNOSIS — Z5181 Encounter for therapeutic drug level monitoring: Secondary | ICD-10-CM

## 2024-01-10 DIAGNOSIS — Z79899 Other long term (current) drug therapy: Secondary | ICD-10-CM

## 2024-01-10 DIAGNOSIS — I480 Paroxysmal atrial fibrillation: Secondary | ICD-10-CM | POA: Diagnosis not present

## 2024-01-10 DIAGNOSIS — D6869 Other thrombophilia: Secondary | ICD-10-CM | POA: Diagnosis not present
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
## 2024-01-12 ENCOUNTER — Telehealth: Payer: Self-pay | Admitting: Urology

## 2024-01-12 NOTE — Telephone Encounter (Signed)
 called pt to r/s due to Dahlstedt being out of office ANN 01/12/24

## 2024-01-13 NOTE — Telephone Encounter (Signed)
 called  and lvm pt to r/s due to Dahlstedt being out of office

## 2024-01-15 ENCOUNTER — Other Ambulatory Visit: Payer: Self-pay | Admitting: Obstetrics

## 2024-01-15 DIAGNOSIS — R351 Nocturia: Secondary | ICD-10-CM

## 2024-01-15 DIAGNOSIS — N3946 Mixed incontinence: Secondary | ICD-10-CM

## 2024-01-24 NOTE — Telephone Encounter (Signed)
 called  and lvm pt to r/s due to Dahlstedt being out of office

## 2024-01-31 ENCOUNTER — Encounter: Payer: Self-pay | Admitting: *Deleted

## 2024-02-01 ENCOUNTER — Ambulatory Visit: Admitting: Urology
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
## 2024-02-27 ENCOUNTER — Ambulatory Visit: Admitting: Urology

## 2024-02-27 DIAGNOSIS — N952 Postmenopausal atrophic vaginitis: Secondary | ICD-10-CM

## 2024-02-27 DIAGNOSIS — N390 Urinary tract infection, site not specified: Secondary | ICD-10-CM
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
## 2024-02-29 ENCOUNTER — Ambulatory Visit: Admitting: Urology

## 2024-02-29 VITALS — BP 149/84 | HR 94 | Ht 63.0 in | Wt 250.0 lb

## 2024-02-29 DIAGNOSIS — R32 Unspecified urinary incontinence: Secondary | ICD-10-CM

## 2024-02-29 DIAGNOSIS — N952 Postmenopausal atrophic vaginitis: Secondary | ICD-10-CM

## 2024-02-29 DIAGNOSIS — Z87440 Personal history of urinary (tract) infections: Secondary | ICD-10-CM | POA: Diagnosis not present

## 2024-02-29 DIAGNOSIS — N390 Urinary tract infection, site not specified: Secondary | ICD-10-CM

## 2024-02-29 DIAGNOSIS — N3281 Overactive bladder: Secondary | ICD-10-CM

## 2024-02-29 LAB — URINALYSIS, ROUTINE W REFLEX MICROSCOPIC
Bilirubin, UA: NEGATIVE
Ketones, UA: NEGATIVE
Nitrite, UA: NEGATIVE
Protein,UA: NEGATIVE
RBC, UA: NEGATIVE
Specific Gravity, UA: 1.02 (ref 1.005–1.030)
Urobilinogen, Ur: 0.2 mg/dL (ref 0.2–1.0)
pH, UA: 5.5 (ref 5.0–7.5)

## 2024-02-29 LAB — MICROSCOPIC EXAMINATION

## 2024-03-27 ENCOUNTER — Other Ambulatory Visit: Payer: Self-pay | Admitting: Physician Assistant

## 2024-04-09 ENCOUNTER — Encounter: Payer: Self-pay | Admitting: *Deleted

## 2024-04-16 ENCOUNTER — Ambulatory Visit: Admitting: Obstetrics

## 2024-04-18 ENCOUNTER — Ambulatory Visit: Admitting: Obstetrics

## 2024-05-01 ENCOUNTER — Other Ambulatory Visit: Payer: Self-pay | Admitting: Obstetrics

## 2024-05-01 DIAGNOSIS — R351 Nocturia: Secondary | ICD-10-CM

## 2024-05-01 DIAGNOSIS — N3946 Mixed incontinence: Secondary | ICD-10-CM

## 2024-07-10 ENCOUNTER — Ambulatory Visit (HOSPITAL_COMMUNITY): Admitting: Physician Assistant
